# Patient Record
Sex: Female | Born: 1945
Health system: Southern US, Community
[De-identification: ages and names within clinical notes are randomized; demographics above are authoritative.]

## PROBLEM LIST (undated history)

## (undated) DIAGNOSIS — K5790 Diverticulosis of intestine, part unspecified, without perforation or abscess without bleeding: Secondary | ICD-10-CM

## (undated) DIAGNOSIS — M1990 Unspecified osteoarthritis, unspecified site: Secondary | ICD-10-CM

## (undated) DIAGNOSIS — R011 Cardiac murmur, unspecified: Secondary | ICD-10-CM

## (undated) DIAGNOSIS — D649 Anemia, unspecified: Secondary | ICD-10-CM

## (undated) DIAGNOSIS — K5090 Crohn's disease, unspecified, without complications: Secondary | ICD-10-CM

## (undated) DIAGNOSIS — R197 Diarrhea, unspecified: Secondary | ICD-10-CM

## (undated) DIAGNOSIS — Z87442 Personal history of urinary calculi: Secondary | ICD-10-CM

## (undated) DIAGNOSIS — N200 Calculus of kidney: Secondary | ICD-10-CM

## (undated) HISTORY — DX: Diarrhea, unspecified: R19.7

## (undated) HISTORY — PX: BREAST SURGERY: SHX581

## (undated) HISTORY — DX: Crohn's disease, unspecified, without complications: K50.90

---

## 1996-12-17 HISTORY — PX: LAPAROSCOPIC CHOLECYSTECTOMY: SUR755

## 1999-09-01 HISTORY — PX: DILATION AND CURETTAGE OF UTERUS: SHX78

## 2000-02-09 ENCOUNTER — Other Ambulatory Visit: Admission: RE | Admit: 2000-02-09 | Discharge: 2000-02-09 | Payer: Self-pay | Admitting: *Deleted

## 2000-05-23 ENCOUNTER — Encounter: Admission: RE | Admit: 2000-05-23 | Discharge: 2000-05-23 | Payer: Self-pay | Admitting: *Deleted

## 2000-07-17 ENCOUNTER — Encounter (INDEPENDENT_AMBULATORY_CARE_PROVIDER_SITE_OTHER): Payer: Self-pay

## 2000-07-17 ENCOUNTER — Ambulatory Visit (HOSPITAL_COMMUNITY): Admission: RE | Admit: 2000-07-17 | Discharge: 2000-07-17 | Payer: Self-pay | Admitting: *Deleted

## 2001-02-12 ENCOUNTER — Other Ambulatory Visit: Admission: RE | Admit: 2001-02-12 | Discharge: 2001-02-12 | Payer: Self-pay | Admitting: *Deleted

## 2001-03-01 ENCOUNTER — Ambulatory Visit (HOSPITAL_COMMUNITY): Admission: RE | Admit: 2001-03-01 | Discharge: 2001-03-01 | Payer: Self-pay | Admitting: Internal Medicine

## 2001-10-16 ENCOUNTER — Ambulatory Visit (HOSPITAL_COMMUNITY): Admission: RE | Admit: 2001-10-16 | Discharge: 2001-10-16 | Payer: Self-pay | Admitting: Internal Medicine

## 2001-10-16 HISTORY — PX: COLONOSCOPY: SHX174

## 2002-03-04 ENCOUNTER — Other Ambulatory Visit: Admission: RE | Admit: 2002-03-04 | Discharge: 2002-03-04 | Payer: Self-pay | Admitting: *Deleted

## 2003-06-18 ENCOUNTER — Other Ambulatory Visit: Admission: RE | Admit: 2003-06-18 | Discharge: 2003-06-18 | Payer: Self-pay | Admitting: *Deleted

## 2004-07-12 ENCOUNTER — Ambulatory Visit: Payer: Self-pay | Admitting: Internal Medicine

## 2005-03-06 ENCOUNTER — Ambulatory Visit: Payer: Self-pay | Admitting: Internal Medicine

## 2006-04-26 ENCOUNTER — Ambulatory Visit: Payer: Self-pay | Admitting: Internal Medicine

## 2006-10-03 ENCOUNTER — Ambulatory Visit: Payer: Self-pay | Admitting: Internal Medicine

## 2010-05-25 ENCOUNTER — Ambulatory Visit: Payer: Self-pay | Admitting: Internal Medicine

## 2010-07-05 ENCOUNTER — Ambulatory Visit: Payer: Self-pay | Admitting: Internal Medicine

## 2010-07-07 ENCOUNTER — Ambulatory Visit (HOSPITAL_COMMUNITY)
Admission: RE | Admit: 2010-07-07 | Discharge: 2010-07-07 | Payer: Self-pay | Source: Home / Self Care | Admitting: Internal Medicine

## 2010-07-08 ENCOUNTER — Ambulatory Visit (HOSPITAL_COMMUNITY)
Admission: RE | Admit: 2010-07-08 | Discharge: 2010-07-08 | Payer: Self-pay | Source: Home / Self Care | Attending: Internal Medicine | Admitting: Internal Medicine

## 2010-07-19 ENCOUNTER — Ambulatory Visit: Payer: Self-pay | Admitting: Internal Medicine

## 2010-10-18 ENCOUNTER — Ambulatory Visit (INDEPENDENT_AMBULATORY_CARE_PROVIDER_SITE_OTHER): Payer: BC Managed Care – PPO | Admitting: Internal Medicine

## 2010-10-18 DIAGNOSIS — R197 Diarrhea, unspecified: Secondary | ICD-10-CM

## 2010-10-18 DIAGNOSIS — K5000 Crohn's disease of small intestine without complications: Secondary | ICD-10-CM

## 2010-10-18 DIAGNOSIS — K5090 Crohn's disease, unspecified, without complications: Secondary | ICD-10-CM

## 2010-10-18 DIAGNOSIS — K5900 Constipation, unspecified: Secondary | ICD-10-CM | POA: Insufficient documentation

## 2010-10-18 HISTORY — DX: Crohn's disease, unspecified, without complications: K50.90

## 2010-10-18 HISTORY — DX: Diarrhea, unspecified: R19.7

## 2010-12-16 NOTE — Op Note (Signed)
[facility]  Patient:    Gail Henry, Gail Henry                    MRN: [id number] Proc. Date: 07/17/00 Adm. Date:  [id number] Attending:  Adela Lank CC:         Dr. Stanford [place], [place]   Operative Report  PREOPERATIVE DIAGNOSES:       1. Postmenopausal bleeding on hormone                                  replacement therapy.                               2. Abnormal sonogram suggestive of polyp.  POSTOPERATIVE DIAGNOSES:      1. Small right peritubal polyps.                               2. Postmenopausal bleeding on hormone                                  replacement therapy.  OPERATION:                    1. Examination under anesthesia.                               2. Fractional D&C hysteroscopy with polyp                                  resection.  SURGEON:                      Freddie Apley, M.D.  ASSISTANT:  ANESTHESIA:                   General endotracheal anesthesia.  ESTIMATED BLOOD LOSS:  INDICATIONS:                  Gail Henry is a 65 year old female who was seen in the office for evaluation of abnormal bleeding on hormone replacement therapy.  She was bleeding on Prempro then switched to [place]imb[place] and continued to have irregular bleeding.  Following her initial visit, plans were made to perform a transvaginal sonogram after Provera withdrawal.  This was performed in July.  At that time, there was a very tiny hyperechoic mass in the fundus. The patient could not tolerate balloon inflation for hydrosonogram.  We placed her on Premphase for three months with colander and on that medication, s he had constant vaginal discharge which was pink in nature.  Plans were made to perform D&C hysteroscopy and she is brought to the operating room today for this purpose.  OPERATIVE FINDINGS:           Examination under anesthesia shows a firm anteflexed uterus which is approximately eight weeks in size. There is  the suggestion of a posterior fundal myoma.  There are no adnexal masses.  On sonogram in the office she also had several myomas arising from the fundus. Endocervical curettings were scant, mostly mucus in nature.  Endometrial sound passed to  9 cm.  The lower uterine segment was large and the fundus was expanded.  There was a large amount of free endometrium lying in the top of the fundus.  Once this had been cleared out, there were two very small polyps seen near the right tubal ostia. This was either a bilobed polyp or two small polyps which are lying next to each other.  DESCRIPTION OF PROCEDURE:     Gail Henry is brought to the operating room with an IV placed.  She received a gram of Ancef in the holding area.  Supine on the OR table, general endotracheal anesthesia was administered and then she was placed into Allen stirrups.  Examination under anesthesia was performed. She was then prepped with Hibiclens solution, prepping the vagina, cervix, perineum and upper thighs as well as lower abdomen. She was then draped for a vaginal procedure with a collecting drape lying beneath the buttocks to collect the effluent for I&O measurements.  Bivalve speculum was inserted into the vagina.  Marcaine 0.25% was injected into the anterior cervix which was grasped with single-tooth tenaculum.  Endocervical curettings were obtained with a Kevorkian curette.  Uterine sound then passed into the endometrial cavity to a depth of 9 cm.  Serial Pratt dilators were used to dilate the cervix.  When we got to size 31, the dilator would no longer pass and therefore a tenaculum was placed on the posterior cervix.  Using these in tandem, I was able to dilate the cervix to admit the hysteroscope.  The hysteroscope was attached to Sorbitol which was used for through and through irrigation.  The cavity was visualized and photographs were taken.  Once these photographs were taken, the scope was removed and a  small sharp curette was used to curette the uterine fundus. The hysteroscope was reintroduced and two small polyps were found lying above the tubal ostia on the patients right. The right angle wire would not go above the polyps and it was impossible to resect them using this right angle wire. The wire was changed to a straight wire and using this wire, I was able to get behind the polyps using 110/110 blend one cautery, the polyps were resected. They were sent along with the endometrial curettings.  A small sharp curette was reintroduced.  Polyp forceps were introduced to grab any loose tissue. The procedure was then terminated.  The effluent which was missing at the end of the procedure was approximately 200 cc.  There were no intraoperative complications.  The patient was taken to the recovery room in good condition. She will be discharged to home with her husband this afternoon. DD:  07/17/00 TD:  07/18/00 Job: [id number]

## 2011-01-19 LAB — BASIC METABOLIC PANEL
Creatinine: 0.7 mg/dL (ref 0.5–1.1)
Glucose: 83 mg/dL
Potassium: 4.5 mmol/L (ref 3.4–5.3)
Sodium: 138 mmol/L (ref 137–147)

## 2011-01-19 LAB — CBC AND DIFFERENTIAL
HCT: 43 % (ref 36–46)
Hemoglobin: 13.8 g/dL (ref 12.0–16.0)
Platelets: 305 10*3/uL (ref 150–399)

## 2011-01-19 LAB — HEPATIC FUNCTION PANEL: ALT: 17 U/L (ref 7–35)

## 2011-02-20 ENCOUNTER — Ambulatory Visit (INDEPENDENT_AMBULATORY_CARE_PROVIDER_SITE_OTHER): Payer: BC Managed Care – PPO | Admitting: Internal Medicine

## 2011-02-27 ENCOUNTER — Encounter (INDEPENDENT_AMBULATORY_CARE_PROVIDER_SITE_OTHER): Payer: Self-pay

## 2011-04-17 ENCOUNTER — Encounter (INDEPENDENT_AMBULATORY_CARE_PROVIDER_SITE_OTHER): Payer: Self-pay | Admitting: Internal Medicine

## 2011-04-17 ENCOUNTER — Ambulatory Visit (INDEPENDENT_AMBULATORY_CARE_PROVIDER_SITE_OTHER): Payer: BC Managed Care – PPO | Admitting: Internal Medicine

## 2011-04-17 VITALS — BP 118/70 | HR 74 | Temp 98.3°F | Ht 66.0 in | Wt 180.0 lb

## 2011-04-17 DIAGNOSIS — K5090 Crohn's disease, unspecified, without complications: Secondary | ICD-10-CM

## 2011-04-17 NOTE — Patient Instructions (Signed)
Please go to lab for CRP. Continue AcipHex and Pentasa as before. Call if abdominal pain gets worse.

## 2011-04-17 NOTE — Progress Notes (Signed)
Presenting complaint; followup for small bowel Crohn's disease. Subjective; patient states she is back to her baseline. She finished budesonide last month and nothing has changed . She has diarrhea and or constipation. Most days she has 1-3 bowel movements per day. Every time she eats breakfast she feels sick and has 1 to 3 bowel movements these symptoms do not recur with other meals. She has occasional fleeting pain in right lower quadrant of her abdomen. She denies rectal bleeding. She has gained 5 pounds since her last visit. Her heartburn is well controlled with therapy. Current medications; Current Outpatient Prescriptions on File Prior to Visit  Medication Sig Dispense Refill  . mesalamine (PENTASA) 250 MG CR capsule Take 500 mg by mouth. Takes 2 Capsules by Mouth Four Times Daily      . Multiple Vitamins-Calcium (VIACTIV MULTI-VITAMIN) CHEW Chew by mouth. Patient takes 3 daily      . RABEprazole (ACIPHEX) 20 MG tablet Take 20 mg by mouth daily.         objective; BP 118/70  Pulse 74  Temp(Src) 98.3 F (36.8 C) (Oral)  Ht 5' 6"  (1.676 m)  Wt 180 lb (81.647 kg)  BMI 29.05 kg/m2 Patient does not appear to be in any distress. Conjunctiva is pink. Sclerae nonicteric. Oropharyngeal mucosa is normal. Lungs are clear to auscultation. Abdomen is full. Bowel sounds are normal. Soft abdomen on palpation with fullness and mild tenderness in right lower/mid quadrant no organomegaly noted. No clubbing. She has trace edema around right ankle. Lab data; She had normal CBC and comprehensive chemistry panel in June 2012 Assessment; Ileal Crohn's disease. She had satisfactory response to the budesonide. However she still has some fullness and tenderness in right lower/mid quadrant. She could not tolerate 6-MP on account of bump in her transaminases and let cramps. If she has another relapse would consider biologic therapy. Plan; Continue Pentasa at 4 g per day. We'll check her CRP level. Unless she  has problems she will return for office visit in 6 months.

## 2011-04-26 ENCOUNTER — Telehealth (INDEPENDENT_AMBULATORY_CARE_PROVIDER_SITE_OTHER): Payer: Self-pay | Admitting: *Deleted

## 2011-04-26 DIAGNOSIS — K5090 Crohn's disease, unspecified, without complications: Secondary | ICD-10-CM

## 2011-04-26 NOTE — Telephone Encounter (Signed)
Per Dr. Laural Golden the patient will need a repeat CRP. This is noted for 05-26-11. The order will be faxed to [facility] in October.

## 2011-05-04 ENCOUNTER — Encounter (INDEPENDENT_AMBULATORY_CARE_PROVIDER_SITE_OTHER): Payer: Self-pay | Admitting: *Deleted

## 2011-05-26 ENCOUNTER — Other Ambulatory Visit (INDEPENDENT_AMBULATORY_CARE_PROVIDER_SITE_OTHER): Payer: Self-pay | Admitting: Internal Medicine

## 2011-06-11 ENCOUNTER — Other Ambulatory Visit (INDEPENDENT_AMBULATORY_CARE_PROVIDER_SITE_OTHER): Payer: Self-pay | Admitting: Internal Medicine

## 2011-06-11 DIAGNOSIS — K219 Gastro-esophageal reflux disease without esophagitis: Secondary | ICD-10-CM

## 2011-08-02 ENCOUNTER — Encounter (INDEPENDENT_AMBULATORY_CARE_PROVIDER_SITE_OTHER): Payer: Self-pay | Admitting: *Deleted

## 2011-08-22 ENCOUNTER — Ambulatory Visit (INDEPENDENT_AMBULATORY_CARE_PROVIDER_SITE_OTHER): Payer: BC Managed Care – PPO | Admitting: Internal Medicine

## 2011-08-22 ENCOUNTER — Encounter (INDEPENDENT_AMBULATORY_CARE_PROVIDER_SITE_OTHER): Payer: Self-pay | Admitting: Internal Medicine

## 2011-08-22 VITALS — BP 110/80 | HR 76 | Temp 97.8°F | Resp 16 | Ht 66.0 in | Wt 176.3 lb

## 2011-08-22 DIAGNOSIS — K219 Gastro-esophageal reflux disease without esophagitis: Secondary | ICD-10-CM

## 2011-08-22 DIAGNOSIS — K5000 Crohn's disease of small intestine without complications: Secondary | ICD-10-CM

## 2011-08-22 DIAGNOSIS — S2231XA Fracture of one rib, right side, initial encounter for closed fracture: Secondary | ICD-10-CM | POA: Insufficient documentation

## 2011-08-22 MED ORDER — PREDNISONE 5 MG PO TABS: 10.0000 mg | ORAL_TABLET | Freq: Every day | ORAL | Status: AC

## 2011-08-22 NOTE — Progress Notes (Signed)
Presenting complaint; Right-sided abdominal pain. Patient with known small bowel Crohn's disease Subjective: Shotted a 66 year old Caucasian female who is here for scheduled visit. He was last seen in September 2012 and had mildly elevated CRP and a month later it was down to 0.51. At that time she was treated with budesonide but could not tell symptomatic improvement. She now presents with right lower quadrant pain and diarrhea. She is having at least 2-3 stools per day. Previously she would have diarrhea and/or constipation. She denies melena or rectal bleeding. Last month she developed a sinusitis and was treated with Cipro and Hydromet and now she's developed fracture to one of her ribs on the right site secondary to coughing spells. She denies shortness of breath fever. She says her heartburn is well controlled with AcipHex Current Medications: Current Outpatient Prescriptions  Medication Sig Dispense Refill  . ACIPHEX 20 MG tablet TAKE 1 TABLET EACH MORNING HAS DISCOUNT CARD ON FILE  30 tablet  11  . Carboxymethylcellulose Sodium (LUBRICANT EYE DROPS OP) Apply to eye. 2 drops in each eye twice daily       . Cholecalciferol (VITAMIN D3) 2000 UNITS TABS Take 2,000 Units by mouth daily.        . fluticasone (FLONASE) 50 MCG/ACT nasal spray Place 1 spray into the nose daily. As needed      . folic acid (FOLVITE) 093 MCG tablet Take 400 mcg by mouth daily.        . mesalamine (PENTASA) 250 MG CR capsule Take 500 mg by mouth. Takes 2 Capsules by Mouth Four Times Daily      . Multiple Vitamins-Calcium (VIACTIV MULTI-VITAMIN) CHEW Chew by mouth. Patient takes 3 daily      . Multiple Vitamins-Minerals (MULTIVITAMIN & MINERAL PO) Take by mouth 2 (two) times daily.        . predniSONE (DELTASONE) 5 MG tablet Take 2 tablets (10 mg total) by mouth daily.  50 tablet  0    Objective: Blood pressure 110/80, pulse 76, temperature 97.8 F (36.6 C), temperature source Oral, resp. rate 16, height 5' 6"   (1.676 m), weight 176 lb 4.8 oz (79.969 kg). Conjunctiva is pink. Sclera is nonicteric Oral pharyngeal mucosa is normal. No neck masses or thyromegaly noted. Cardiac exam with regular rhythm normal S1 and S2. No murmur or gallop noted. Lungs are clear to auscultation. Abdomen abdomen is full. Bowel sounds are normal. Soft abdomen with fullness and mild to moderate tenderness in right lower and mid quadrant. No hepatosplenomegaly or masses noted.  No LE edema or clubbing noted.  Labs/studies Results: CRP from 08/14/2011 was 0.47(Dr. Tapper's office).  Assessment: #1. Patient's exam suggests she may have relapse of small bowel Crohn's disease even though her CRP is normal. She is currently on Pentasa which is weak medicine for small bowel disease. She developed problems with 6 MP. If she develops progressive symptoms next step would be biologic therapy. #2. Chronic GERD. Symptoms well controlled with PPI anti-reflex measures.   Plan: Continue Pentasa at current dose.  Prednisone 30 mg daily for 7 days. Prednisone 20 mg daily for 7 days. Prednisone 10 mg daily for 7 days. Prednisone 5 mg daily for 7 days and stop. Call office with progress report at 4 weeks. Unless has problems we'll plan to see her back in 6 months. Will schedule her for screening colonoscopy following her next was. Her last colonoscopy was in June 2003.

## 2011-08-22 NOTE — Patient Instructions (Signed)
Prednisone schedule as follows. 30 mg daily for 7 days. 20 mg daily for 7 days. 10 mg daily for 7 days. 5 mg daily for 7 days and stop. Call with progress report in 4 weeks

## 2012-02-19 ENCOUNTER — Ambulatory Visit (INDEPENDENT_AMBULATORY_CARE_PROVIDER_SITE_OTHER): Payer: BC Managed Care – PPO | Admitting: Internal Medicine

## 2012-02-19 ENCOUNTER — Encounter (INDEPENDENT_AMBULATORY_CARE_PROVIDER_SITE_OTHER): Payer: Self-pay | Admitting: Internal Medicine

## 2012-02-19 ENCOUNTER — Other Ambulatory Visit (INDEPENDENT_AMBULATORY_CARE_PROVIDER_SITE_OTHER): Payer: Self-pay | Admitting: *Deleted

## 2012-02-19 ENCOUNTER — Telehealth (INDEPENDENT_AMBULATORY_CARE_PROVIDER_SITE_OTHER): Payer: Self-pay | Admitting: *Deleted

## 2012-02-19 VITALS — BP 112/74 | HR 70 | Temp 97.9°F | Resp 18 | Ht 66.0 in | Wt 180.2 lb

## 2012-02-19 DIAGNOSIS — K5090 Crohn's disease, unspecified, without complications: Secondary | ICD-10-CM

## 2012-02-19 DIAGNOSIS — Z1211 Encounter for screening for malignant neoplasm of colon: Secondary | ICD-10-CM

## 2012-02-19 DIAGNOSIS — Z139 Encounter for screening, unspecified: Secondary | ICD-10-CM

## 2012-02-19 MED ORDER — SOD PHOS MONO-SOD PHOS DIBASIC 1.102-0.398 G PO TABS: 1.0000 | ORAL_TABLET | Freq: Once | ORAL | Status: DC

## 2012-02-19 NOTE — Telephone Encounter (Signed)
agree

## 2012-02-19 NOTE — Patient Instructions (Addendum)
Colonoscopy to be scheduled. Physician will contact you with results of blood work.

## 2012-02-19 NOTE — Progress Notes (Signed)
Presenting complaint;  Follow for small bowel Crohn's disease.  Subjective:  Patient is 66 -year-old Caucasian female who is here for scheduled visit. She was last seen in January this year for flareups treated with short course of prednisone. She has not had any problems since then. Her only complaint is one of bloating without abdominal pain or diarrhea. Since she has been yogurt with probiotic her bowels been moving regularly usually twice daily. She denies melena or rectal bleeding. Only time she experiences heartburn is she forgets to take her medication. She has gained 4 pounds since her last visit. She does walk 1.75 miles at least 5 times a week. She had bone density study last month in [place] and it was normal.  Current Medications: Current Outpatient Prescriptions  Medication Sig Dispense Refill  . ACIPHEX 20 MG tablet TAKE 1 TABLET EACH MORNING HAS DISCOUNT CARD ON FILE  30 tablet  11  . Carboxymethylcellulose Sodium (LUBRICANT EYE DROPS OP) Apply to eye. 2 drops in each eye twice daily       . Cholecalciferol (VITAMIN D3) 2000 UNITS TABS Take 2,000 Units by mouth daily.        . fluticasone (FLONASE) 50 MCG/ACT nasal spray Place 1 spray into the nose daily. As needed      . folic acid (FOLVITE) 428 MCG tablet Take 400 mcg by mouth daily.        . mesalamine (PENTASA) 250 MG CR capsule Take 500 mg by mouth. Takes 2 Capsules by Mouth Four Times Daily      . Multiple Vitamins-Calcium (VIACTIV MULTI-VITAMIN) CHEW Chew by mouth. Patient takes 3 daily      . Multiple Vitamins-Minerals (MULTIVITAMIN & MINERAL PO) Take by mouth 2 (two) times daily.        Marland Kitchen spironolactone (ALDACTONE) 25 MG tablet Take 25 mg by mouth daily.       Marland Kitchen ZOSTAVAX 76811 UNT/0.65ML injection Inject into the skin once.          Objective: Blood pressure 112/74, pulse 70, temperature 97.9 F (36.6 C), temperature source Oral, resp. rate 18, height 5' 6"  (1.676 m), weight 180 lb 3.2 oz (81.738 kg). Patient is  alert and in no acute distress. Conjunctiva is pink. Sclera is nonicteric Oropharyngeal mucosa is normal. No neck masses or thyromegaly noted. Cardiac exam with regular rhythm normal S1 and S2. No murmur or gallop noted. Lungs are clear to auscultation. Abdomen is full. Bowel sounds are normal. Soft abdomen on palpation with fullness in the right lower quadrant but no organomegaly or masses.  No LE edema or clubbing noted.   Assessment:  #1. Small bowel Crohn's disease. She appears to be in remission. Currently on oral mesalamine i.e. Pentasa. #2. Chronic GERD. Symptoms well controlled with therapy.   Plan:  She will go to the lab for CBC, comprehensive chemistry panel and a TSH level. Continue current therapy. Screening colonoscopy  sometime this year(last exam in 2003). Office visit in 6 months.

## 2012-02-19 NOTE — Telephone Encounter (Signed)
Patient needs osmo pill prep, she is aware that prep isn't on her formulary -- if cost is too high she will let me know and we will change to a different prep

## 2012-02-22 LAB — COMPREHENSIVE METABOLIC PANEL
Albumin: 3.9 g/dL (ref 3.5–5.2)
Alkaline Phosphatase: 96 U/L (ref 39–117)
BUN: 15 mg/dL (ref 6–23)
Calcium: 9.5 mg/dL (ref 8.4–10.5)
Chloride: 103 mEq/L (ref 96–112)
Glucose, Bld: 87 mg/dL (ref 70–99)
Potassium: 4.3 mEq/L (ref 3.5–5.3)
Sodium: 140 mEq/L (ref 135–145)
Total Protein: 6.9 g/dL (ref 6.0–8.3)

## 2012-02-22 LAB — CBC
HCT: 39.6 % (ref 36.0–46.0)
Hemoglobin: 13.6 g/dL (ref 12.0–15.0)
MCHC: 34.3 g/dL (ref 30.0–36.0)
RBC: 4.7 MIL/uL (ref 3.87–5.11)
WBC: 5.9 10*3/uL (ref 4.0–10.5)

## 2012-03-05 ENCOUNTER — Encounter (HOSPITAL_COMMUNITY): Payer: Self-pay | Admitting: Pharmacy Technician

## 2012-03-15 ENCOUNTER — Ambulatory Visit (HOSPITAL_COMMUNITY)
Admission: RE | Admit: 2012-03-15 | Discharge: 2012-03-15 | Disposition: A | Discharge status: Home / Self Care | Payer: BC Managed Care – PPO | Source: Ambulatory Visit | Attending: Internal Medicine | Admitting: Internal Medicine

## 2012-03-15 ENCOUNTER — Encounter (HOSPITAL_COMMUNITY)
Admission: RE | Disposition: A | Discharge status: Home / Self Care | Payer: Self-pay | Source: Ambulatory Visit | Attending: Internal Medicine

## 2012-03-15 ENCOUNTER — Encounter (HOSPITAL_COMMUNITY): Payer: Self-pay | Admitting: *Deleted

## 2012-03-15 DIAGNOSIS — K5289 Other specified noninfective gastroenteritis and colitis: Secondary | ICD-10-CM | POA: Insufficient documentation

## 2012-03-15 DIAGNOSIS — Z1211 Encounter for screening for malignant neoplasm of colon: Secondary | ICD-10-CM | POA: Insufficient documentation

## 2012-03-15 DIAGNOSIS — D126 Benign neoplasm of colon, unspecified: Secondary | ICD-10-CM | POA: Insufficient documentation

## 2012-03-15 DIAGNOSIS — K5730 Diverticulosis of large intestine without perforation or abscess without bleeding: Secondary | ICD-10-CM | POA: Insufficient documentation

## 2012-03-15 DIAGNOSIS — K644 Residual hemorrhoidal skin tags: Secondary | ICD-10-CM

## 2012-03-15 DIAGNOSIS — K6389 Other specified diseases of intestine: Secondary | ICD-10-CM

## 2012-03-15 HISTORY — PX: COLONOSCOPY: SHX5424

## 2012-03-15 SURGERY — COLONOSCOPY
Anesthesia: Moderate Sedation

## 2012-03-15 MED ORDER — MEPERIDINE HCL 50 MG/ML IJ SOLN
INTRAMUSCULAR | Status: AC
  Filled 2012-03-15: qty 1

## 2012-03-15 MED ORDER — STERILE WATER FOR IRRIGATION IR SOLN
Status: DC | PRN
  Administered 2012-03-15: 08:00:00

## 2012-03-15 MED ORDER — MEPERIDINE HCL 50 MG/ML IJ SOLN
INTRAMUSCULAR | Status: DC | PRN
  Administered 2012-03-15 (×2): 25 mg via INTRAVENOUS

## 2012-03-15 MED ORDER — MIDAZOLAM HCL 5 MG/5ML IJ SOLN
INTRAMUSCULAR | Status: AC
  Filled 2012-03-15: qty 10

## 2012-03-15 MED ORDER — MIDAZOLAM HCL 5 MG/5ML IJ SOLN
INTRAMUSCULAR | Status: DC | PRN
  Administered 2012-03-15 (×4): 2 mg via INTRAVENOUS

## 2012-03-15 MED ORDER — SODIUM CHLORIDE 0.45 % IV SOLN
Freq: Once | INTRAVENOUS | Status: AC
  Administered 2012-03-15: 1000 mL via INTRAVENOUS

## 2012-03-15 NOTE — H&P (Signed)
Gail Henry is an 66 y.o. female.   Chief Complaint: Patient is here for colonoscopy. HPI: Patient is 67 year old Caucasian female who is here for average risk screening colonoscopy. Last exam was 10 years ago. She denies abdominal pain rectal bleeding. She has small bowel Crohn's disease with a 2 surgeries in the past. She has had intermittent constipation. She has good appetite and her weight has been stable. Family history is negative for colorectal carcinoma.  Past Medical History  Diagnosis Date  . Crohn's disease 10/18/2010    SMALL BOWELS  . Abdominal pain 10/18/2010  . Diarrhea 10/18/2010  . Constipation 10/18/2010    Past Surgical History  Procedure Date  . Laparoscopic cholecystectomy 12/17/96    DEMASON  . Colonoscopy 10/16/2001    Family History  Problem Relation Age of Onset  . Heart disease Father   . Healthy Sister   . Healthy Brother   . Healthy Sister   . Healthy Daughter   . Healthy Son    Social History:  reports that she has never smoked. She has never used smokeless tobacco. She reports that she does not drink alcohol or use illicit drugs.  Allergies:  Allergies  Allergen Reactions  . Penicillins Hives    Medications Prior to Admission  Medication Sig Dispense Refill  . ACIPHEX 20 MG tablet TAKE 1 TABLET EACH MORNING HAS DISCOUNT CARD ON FILE  30 tablet  11  . Carboxymethylcellulose Sodium (LUBRICANT EYE DROPS OP) Apply 1 drop to eye as needed. For dry eyes      . Cholecalciferol (VITAMIN D3) 2000 UNITS TABS Take 2,000 Units by mouth daily.       . fluticasone (FLONASE) 50 MCG/ACT nasal spray Place 1 spray into the nose daily as needed. For congestion      . folic acid (FOLVITE) 977 MCG tablet Take 400 mcg by mouth daily.        . mesalamine (PENTASA) 500 MG CR capsule Take 500 mg by mouth 4 (four) times daily.      . Multiple Vitamins-Calcium (VIACTIV MULTI-VITAMIN) CHEW Chew 3 tablets by mouth daily.       . Multiple Vitamins-Minerals  (MULTIVITAMIN & MINERAL PO) Take 1 tablet by mouth 2 (two) times daily.       Marland Kitchen spironolactone (ALDACTONE) 25 MG tablet Take 25 mg by mouth daily.       . sodium phosphates (OSMOPREP) 1.102-0.398 G TABS Take 1 tablet by mouth once.  32 tablet  0    No results found for this or any previous visit (from the past 48 hour(s)). No results found.  ROS  Blood pressure 149/88, pulse 84, temperature 98.5 F (36.9 C), temperature source Oral, resp. rate 20, height 5' 6.5" (1.689 m), weight 173 lb (78.472 kg), SpO2 99.00%. Physical Exam  Constitutional: She appears well-developed and well-nourished.  HENT:  Mouth/Throat: Oropharynx is clear and moist.  Eyes: Conjunctivae are normal. No scleral icterus.  Neck: No thyromegaly present.  Cardiovascular: Normal rate, regular rhythm and normal heart sounds.   No murmur heard. Respiratory: Effort normal and breath sounds normal.  GI: Soft. She exhibits no distension and no mass. There is no tenderness.  Musculoskeletal: She exhibits no edema.  Lymphadenopathy:    She has no cervical adenopathy.  Neurological: She is alert.  Skin: Skin is warm.     Assessment/Plan Average risk screening colonoscopy.  Henry,Gail U 03/15/2012, 7:36 AM

## 2012-03-15 NOTE — Op Note (Signed)
COLONOSCOPY PROCEDURE REPORT  PATIENT:  Gail Henry  MR#:  [id number] Birthdate:  May 13, 1946, 66 y.o., female Endoscopist:  Dr. Rogene Houston, MD Referred By:  Dr. Zella Richer. Scotty Court, MD Procedure Date: 03/15/2012  Procedure:   Colonoscopy  Indications:  Patient is 66 year old Caucasian female was undergoing average risk screening colonoscopy. She has small bowel Crohn disease and has undergone 2 surgeries in the past most recently in 1995.  Informed Consent:  The procedure and risks were reviewed with the patient and informed consent was obtained.  Medications:  Demerol 50 mg IV Versed 8 mg IV  Description of procedure:  After a digital rectal exam was performed, that colonoscope was advanced from the anus through the rectum and colon to the area of the cecum, ileocecal valve and appendiceal orifice. The cecum was deeply intubated. These structures were well-seen and photographed for the record. From the level of the cecum and ileocecal valve, the scope was slowly and cautiously withdrawn. The mucosal surfaces were carefully surveyed utilizing scope tip to flexion to facilitate fold flattening as needed. The scope was pulled down into the rectum where a thorough exam including retroflexion was performed.  Findings:   Prep excellent. Small polyp ablated via cold biopsy from cecum. Abnormal mucosa with nodularity ulceration and friability at ileocecal valve with luminal narrowing. Bile noted to be entering large bowel from this area. Biopsy taken from this area. Did not attempt to pass the scope across it because of luminal narrowing. Scattered diverticula at sigmoid colon. Local nodularity to mucosa at sigmoid colon ulceration. This area was also biopsied for histology. Normal rectal mucosa. Hemorrhoids below the dentate line and 3 anal papillae.  Therapeutic/Diagnostic Maneuvers Performed:  See above  Complications:  None  Cecal Withdrawal Time:  20 minutes  Impression:    Mucosal nodularity with ulceration at ileocecal valve along with luminal narrowing. Biopsy taken from this area. TI not examined. Small cecal polyp ablated via cold biopsy. Focal ulcerated nodular mucosa at sigmoid colon ostomy hyperplastic polyp. Biopsy taken. Scattered sigmoid colon diverticula. External hemorrhoids and 3 anal papillae.  Recommendations:  Standard instructions given. I will be contacting patient with biopsy results and further recommendations.   Riyad Keena U  03/15/2012 8:28 AM  CC: Dr. Deloria Lair, MD & Dr. Rayne Du ref. provider found

## 2012-03-19 ENCOUNTER — Encounter (HOSPITAL_COMMUNITY): Payer: Self-pay | Admitting: Internal Medicine

## 2012-03-20 ENCOUNTER — Encounter (INDEPENDENT_AMBULATORY_CARE_PROVIDER_SITE_OTHER): Payer: Self-pay | Admitting: Internal Medicine

## 2012-03-21 NOTE — Telephone Encounter (Signed)
This encounter was created in error - please disregard.

## 2012-03-24 ENCOUNTER — Other Ambulatory Visit (INDEPENDENT_AMBULATORY_CARE_PROVIDER_SITE_OTHER): Payer: Self-pay | Admitting: Internal Medicine

## 2012-03-25 ENCOUNTER — Telehealth (INDEPENDENT_AMBULATORY_CARE_PROVIDER_SITE_OTHER): Payer: Self-pay | Admitting: *Deleted

## 2012-03-25 DIAGNOSIS — K5010 Crohn's disease of large intestine without complications: Secondary | ICD-10-CM

## 2012-03-25 NOTE — Telephone Encounter (Signed)
Patient will need to have CBC/D in 1 month.Noted for September

## 2012-03-28 ENCOUNTER — Encounter (INDEPENDENT_AMBULATORY_CARE_PROVIDER_SITE_OTHER): Payer: Self-pay | Admitting: *Deleted

## 2012-03-28 ENCOUNTER — Other Ambulatory Visit (INDEPENDENT_AMBULATORY_CARE_PROVIDER_SITE_OTHER): Payer: Self-pay | Admitting: *Deleted

## 2012-03-28 DIAGNOSIS — K5010 Crohn's disease of large intestine without complications: Secondary | ICD-10-CM

## 2012-04-03 ENCOUNTER — Telehealth (INDEPENDENT_AMBULATORY_CARE_PROVIDER_SITE_OTHER): Payer: Self-pay | Admitting: Internal Medicine

## 2012-04-03 MED ORDER — MESALAMINE ER 500 MG PO CPCR: 500.0000 mg | ORAL_CAPSULE | Freq: Four times a day (QID) | ORAL | Status: DC

## 2012-04-03 MED ORDER — MESALAMINE ER 500 MG PO CPCR: 1000.0000 mg | ORAL_CAPSULE | Freq: Four times a day (QID) | ORAL | Status: DC

## 2012-04-03 NOTE — Telephone Encounter (Signed)
Pentasa eprescribed to [facility]

## 2012-04-25 LAB — C-REACTIVE PROTEIN: CRP: 0.6 mg/dL — ABNORMAL HIGH (ref <0.60)

## 2012-05-01 ENCOUNTER — Telehealth (INDEPENDENT_AMBULATORY_CARE_PROVIDER_SITE_OTHER): Payer: Self-pay | Admitting: *Deleted

## 2012-05-01 DIAGNOSIS — K5000 Crohn's disease of small intestine without complications: Secondary | ICD-10-CM

## 2012-05-01 NOTE — Telephone Encounter (Signed)
Per Dr.Rehman the patient will need to have repeat CRP in 3 months. This has been noted for January 2,2014

## 2012-05-16 ENCOUNTER — Telehealth (INDEPENDENT_AMBULATORY_CARE_PROVIDER_SITE_OTHER): Payer: Self-pay | Admitting: *Deleted

## 2012-05-16 NOTE — Telephone Encounter (Signed)
Patient states that about 1 week ago she started having pain on her left side. She knows that her Crohn 's  is on the right. The pain is directly across from that side to the left.  She has noted a change in her bowel movements , they are now formed. She does not go first thing in the morning,it is various times. This happens before and after meals. She has had some nausea. Per Dr.Rehman may call in Entocort  EC she is to take 9 mg by mouth for 2 weeks, then 6 mg for 2 weeks , then 3 mg for 2 weeks then stop. Patient  made aware, Prescription called to [facility].

## 2012-06-17 ENCOUNTER — Encounter (INDEPENDENT_AMBULATORY_CARE_PROVIDER_SITE_OTHER): Payer: Self-pay

## 2012-06-21 ENCOUNTER — Other Ambulatory Visit (INDEPENDENT_AMBULATORY_CARE_PROVIDER_SITE_OTHER): Payer: Self-pay | Admitting: Internal Medicine

## 2012-07-11 ENCOUNTER — Telehealth (INDEPENDENT_AMBULATORY_CARE_PROVIDER_SITE_OTHER): Payer: Self-pay | Admitting: *Deleted

## 2012-07-11 ENCOUNTER — Encounter (INDEPENDENT_AMBULATORY_CARE_PROVIDER_SITE_OTHER): Payer: Self-pay | Admitting: *Deleted

## 2012-07-11 DIAGNOSIS — K5000 Crohn's disease of small intestine without complications: Secondary | ICD-10-CM

## 2012-07-11 NOTE — Telephone Encounter (Signed)
Lab order done

## 2012-08-01 ENCOUNTER — Other Ambulatory Visit (INDEPENDENT_AMBULATORY_CARE_PROVIDER_SITE_OTHER): Payer: Self-pay | Admitting: Internal Medicine

## 2012-08-02 LAB — C-REACTIVE PROTEIN: CRP: 0.9 mg/dL — ABNORMAL HIGH (ref <0.60)

## 2012-08-19 ENCOUNTER — Encounter (INDEPENDENT_AMBULATORY_CARE_PROVIDER_SITE_OTHER): Payer: Self-pay | Admitting: Internal Medicine

## 2012-08-19 ENCOUNTER — Ambulatory Visit (INDEPENDENT_AMBULATORY_CARE_PROVIDER_SITE_OTHER): Payer: BC Managed Care – PPO | Admitting: Internal Medicine

## 2012-08-19 VITALS — BP 110/74 | HR 72 | Temp 97.8°F | Resp 18 | Ht 66.5 in | Wt 181.9 lb

## 2012-08-19 DIAGNOSIS — K649 Unspecified hemorrhoids: Secondary | ICD-10-CM

## 2012-08-19 DIAGNOSIS — K5090 Crohn's disease, unspecified, without complications: Secondary | ICD-10-CM

## 2012-08-19 MED ORDER — NYSTATIN-TRIAMCINOLONE 100000-0.1 UNIT/GM-% EX OINT: TOPICAL_OINTMENT | Freq: Two times a day (BID) | CUTANEOUS | Status: DC

## 2012-08-19 NOTE — Progress Notes (Signed)
Presenting complaint;  Followup for Crohn's disease.  Subjective:  Patient is 67 year old Caucasian female with history of ileocolonic Crohn's disease who is here for scheduled visit. She had a colonoscopy back in August 2013 primarily for screening purposes and was noted to have active disease in neoterminal ileum; ileocecal region as well as sigmoid colon and she had 2 small adenomas removed. She states her diarrhea is back. She is having anywhere from 4-11 stools per day. Most of her stools are loose and watery. Every now and then she has formed stool. She continues to complain of sharp pain at right mid abdomen but it lasts only a few minutes at a time and occurs at least 3 times a week but not daily. She also complains of intermittent nausea. She denies melena, rectal bleeding vomiting or fever. She is having itching and discomfort due to inflamed hemorrhoids. She has not lost any weight. She also complains of severe pain involving arches of both feet and she is scheduled to see her podiatrist. Patient states her heartburn is well controlled with PPI.  Current Medications: Current Outpatient Prescriptions  Medication Sig Dispense Refill  . ACIPHEX 20 MG tablet TAKE 1 TABLET EACH BY MOUTH MORNING HAS DISCOUNT CARD ON FILE  30 each  11  . Carboxymethylcellulose Sodium (LUBRICANT EYE DROPS OP) Apply 1 drop to eye as needed. For dry eyes      . Cholecalciferol (VITAMIN D3) 2000 UNITS TABS Take 2,000 Units by mouth daily.       . fluticasone (FLONASE) 50 MCG/ACT nasal spray Place 1 spray into the nose daily as needed. For congestion      . folic acid (FOLVITE) 073 MCG tablet Take 400 mcg by mouth daily.        . mesalamine (PENTASA) 500 MG CR capsule Take 2 capsules (1,000 mg total) by mouth 4 (four) times daily.  720 capsule  4  . Multiple Vitamins-Calcium (VIACTIV MULTI-VITAMIN) CHEW Chew 3 tablets by mouth daily.       . Multiple Vitamins-Minerals (MULTIVITAMIN & MINERAL PO) Take 1 tablet by  mouth 2 (two) times daily.       Marland Kitchen spironolactone (ALDACTONE) 25 MG tablet Take 25 mg by mouth daily.            Objective: Blood pressure 110/74, pulse 72, temperature 97.8 F (36.6 C), temperature source Oral, resp. rate 18, height 5' 6.5" (1.689 m), weight 181 lb 14.4 oz (82.509 kg). Patient is alert and in no acute distress. Conjunctiva is pink. Sclera is nonicteric Oropharyngeal mucosa is normal. No neck masses or thyromegaly noted. Cardiac exam with regular rhythm normal S1 and S2. No murmur or gallop noted. Lungs are clear to auscultation. Abdomen is full. Bowel sounds are normal. Abdomen is soft with fullness and tenderness and right mid abdomen at and above level of scar. No organomegaly or masses  No LE edema or clubbing noted.  Labs/studies Results: CRP from 08/01/2012 0.9(normal less than 0.6).  Assessment:  #1. Ileocolonic Crohn disease. She is having daily diarrhea and frequent right-sided abdominal pain. CRP from earlier this month is mildly elevated. Symptoms are suggestive of relapse of her disease. She had stricture involving the neoterminal ileum on colonoscopy of August 2013 which also revealed disease and sigmoid colon. Since she is intolerant of 6 MP and therefore may need biologic therapy for control of disease. #2. Perianal discomfort secondary to hemorrhoids.    Plan: Mycolog-II ointment to be applied to anal canal twice a day for  2 weeks and then on as-needed basis. Will schedule small bowel follow-through. Patient advised not to take any NSAIDs. I will contact patient with results of small bowel study and further recommendations. Office visit tentatively scheduled for 6 months.

## 2012-08-19 NOTE — Patient Instructions (Signed)
Physician will contact you with results of small bowel study when completed

## 2012-08-23 ENCOUNTER — Ambulatory Visit (HOSPITAL_COMMUNITY)
Admission: RE | Admit: 2012-08-23 | Discharge: 2012-08-23 | Disposition: A | Discharge status: Home / Self Care | Payer: BC Managed Care – PPO | Source: Ambulatory Visit | Attending: Internal Medicine | Admitting: Internal Medicine

## 2012-08-23 DIAGNOSIS — R933 Abnormal findings on diagnostic imaging of other parts of digestive tract: Secondary | ICD-10-CM | POA: Insufficient documentation

## 2012-08-23 DIAGNOSIS — R197 Diarrhea, unspecified: Secondary | ICD-10-CM | POA: Insufficient documentation

## 2012-08-23 DIAGNOSIS — R109 Unspecified abdominal pain: Secondary | ICD-10-CM | POA: Insufficient documentation

## 2012-08-23 DIAGNOSIS — K5090 Crohn's disease, unspecified, without complications: Secondary | ICD-10-CM

## 2012-08-23 DIAGNOSIS — R110 Nausea: Secondary | ICD-10-CM | POA: Insufficient documentation

## 2012-08-29 ENCOUNTER — Other Ambulatory Visit (INDEPENDENT_AMBULATORY_CARE_PROVIDER_SITE_OTHER): Payer: Self-pay | Admitting: Internal Medicine

## 2012-08-29 DIAGNOSIS — R933 Abnormal findings on diagnostic imaging of other parts of digestive tract: Secondary | ICD-10-CM

## 2012-08-29 DIAGNOSIS — K5090 Crohn's disease, unspecified, without complications: Secondary | ICD-10-CM

## 2012-08-30 ENCOUNTER — Other Ambulatory Visit (HOSPITAL_COMMUNITY): Payer: BC Managed Care – PPO

## 2012-09-03 ENCOUNTER — Ambulatory Visit (HOSPITAL_COMMUNITY)
Admission: RE | Admit: 2012-09-03 | Discharge: 2012-09-03 | Disposition: A | Discharge status: Home / Self Care | Payer: BC Managed Care – PPO | Source: Ambulatory Visit | Attending: Internal Medicine | Admitting: Internal Medicine

## 2012-09-03 DIAGNOSIS — K5090 Crohn's disease, unspecified, without complications: Secondary | ICD-10-CM

## 2012-09-03 DIAGNOSIS — R933 Abnormal findings on diagnostic imaging of other parts of digestive tract: Secondary | ICD-10-CM | POA: Insufficient documentation

## 2012-09-03 MED ORDER — IOHEXOL 300 MG/ML  SOLN
125.0000 mL | Freq: Once | INTRAMUSCULAR | Status: AC | PRN
  Administered 2012-09-03: 125 mL via INTRAVENOUS

## 2012-09-05 ENCOUNTER — Telehealth (INDEPENDENT_AMBULATORY_CARE_PROVIDER_SITE_OTHER): Payer: Self-pay | Admitting: *Deleted

## 2012-09-05 DIAGNOSIS — K5090 Crohn's disease, unspecified, without complications: Secondary | ICD-10-CM

## 2012-09-05 NOTE — Telephone Encounter (Signed)
Patient will need to have CRP in 2 months

## 2012-09-14 ENCOUNTER — Other Ambulatory Visit: Payer: Self-pay

## 2012-10-24 ENCOUNTER — Encounter (INDEPENDENT_AMBULATORY_CARE_PROVIDER_SITE_OTHER): Payer: Self-pay | Admitting: *Deleted

## 2012-10-24 ENCOUNTER — Other Ambulatory Visit (INDEPENDENT_AMBULATORY_CARE_PROVIDER_SITE_OTHER): Payer: Self-pay | Admitting: *Deleted

## 2012-10-24 DIAGNOSIS — K5090 Crohn's disease, unspecified, without complications: Secondary | ICD-10-CM

## 2012-11-01 LAB — C-REACTIVE PROTEIN: CRP: 0.8 mg/dL — ABNORMAL HIGH (ref <0.60)

## 2012-11-08 NOTE — Progress Notes (Signed)
Patient called with a progress report. She is having 2-3 bowel movements a day. Some are formed.States that she has had bouts of constipation. Still having problems with her foot, she has had boils on her inner thighs. They are red,hard. One can to a head , some drainage, and went away. The other one is still there but it is shrinking. She questioned if the boils could be part of her Crohn's disease.

## 2013-02-17 ENCOUNTER — Ambulatory Visit (INDEPENDENT_AMBULATORY_CARE_PROVIDER_SITE_OTHER): Payer: BC Managed Care – PPO | Admitting: Internal Medicine

## 2013-02-17 ENCOUNTER — Other Ambulatory Visit (INDEPENDENT_AMBULATORY_CARE_PROVIDER_SITE_OTHER): Payer: Self-pay | Admitting: Internal Medicine

## 2013-02-17 ENCOUNTER — Encounter (INDEPENDENT_AMBULATORY_CARE_PROVIDER_SITE_OTHER): Payer: Self-pay | Admitting: Internal Medicine

## 2013-02-17 VITALS — BP 114/72 | HR 76 | Temp 98.4°F | Resp 18 | Ht 66.5 in | Wt 184.6 lb

## 2013-02-17 DIAGNOSIS — K5090 Crohn's disease, unspecified, without complications: Secondary | ICD-10-CM

## 2013-02-17 DIAGNOSIS — R1013 Epigastric pain: Secondary | ICD-10-CM

## 2013-02-17 DIAGNOSIS — R110 Nausea: Secondary | ICD-10-CM

## 2013-02-17 LAB — C-REACTIVE PROTEIN: CRP: 0.5 mg/dL (ref <0.60)

## 2013-02-17 LAB — HEPATIC FUNCTION PANEL
Bilirubin, Direct: 0.1 mg/dL (ref 0.0–0.3)
Indirect Bilirubin: 0.3 mg/dL (ref 0.0–0.9)

## 2013-02-17 LAB — LIPASE: Lipase: 77 U/L — ABNORMAL HIGH (ref 0–75)

## 2013-02-17 MED ORDER — PANTOPRAZOLE SODIUM 40 MG PO TBEC: 40.0000 mg | DELAYED_RELEASE_TABLET | Freq: Two times a day (BID) | ORAL | Status: DC

## 2013-02-17 NOTE — Progress Notes (Signed)
Presenting complaint;  Epigastric pain and nausea. History of Crohn's disease.  Subjective:  Patient is 67 year old Caucasian female who has history of small bowel Crohn disease and was last seen in January 2014 presents with new symptoms. She now complains of intermittent epigastric pain and nausea. The symptoms started about 4 weeks ago. She seemed to have more discomfort when she wakes up in the morning gets better as the day progresses. She does not feel that meals make her better or worse. She is having intermittent heartburn but not on daily basis. She has not had any episode of emesis and she also denies melena or rectal bleeding. She is having 2-3 semi-formed to formed stools daily. She has sporadic pain in right lower quadrant of her abdomen. She has not lost any weight since her last visit. She does not take NSAIDs.  Current Medications: Current Outpatient Prescriptions  Medication Sig Dispense Refill  . ACIPHEX 20 MG tablet TAKE 1 TABLET EACH BY MOUTH MORNING HAS DISCOUNT CARD ON FILE  30 each  11  . Carboxymethylcellulose Sodium (LUBRICANT EYE DROPS OP) Apply 1 drop to eye as needed. For dry eyes      . Cholecalciferol (VITAMIN D3) 2000 UNITS TABS Take 2,000 Units by mouth daily.       . fluticasone (FLONASE) 50 MCG/ACT nasal spray Place 1 spray into the nose daily as needed. For congestion      . folic acid (FOLVITE) 053 MCG tablet Take 400 mcg by mouth daily.        . mesalamine (PENTASA) 500 MG CR capsule Take 2 capsules (1,000 mg total) by mouth 4 (four) times daily.  720 capsule  4  . Multiple Vitamins-Calcium (VIACTIV MULTI-VITAMIN) CHEW Chew 3 tablets by mouth daily.       . Multiple Vitamins-Minerals (MULTIVITAMIN & MINERAL PO) Take 1 tablet by mouth 2 (two) times daily.       Marland Kitchen nystatin-triamcinolone ointment (MYCOLOG) Apply topically 2 (two) times daily. Applied to anal canal twice daily for 2 weeks and then as needed  60 g  0  . spironolactone (ALDACTONE) 25 MG tablet  Take 25 mg by mouth daily.        No current facility-administered medications for this visit.     Objective: Blood pressure 114/72, pulse 76, temperature 98.4 F (36.9 C), temperature source Oral, resp. rate 18, height 5' 6.5" (1.689 m), weight 184 lb 9.6 oz (83.734 kg). Patient is alert and in no acute distress. Conjunctiva is pink. Sclera is nonicteric Oropharyngeal mucosa is normal. No neck masses or thyromegaly noted. Cardiac exam with regular rhythm normal S1 and S2. No murmur or gallop noted. Lungs are clear to auscultation. Abdomen is full. Bowel sounds are normal. Abdomen is soft and mild midepigastric tenderness. She has mild tenderness in right lower quadrant as well. No organomegaly or masses. No LE edema or clubbing noted. Assessment:  #1. Epigastric pain and nausea. These symptoms started 4 weeks ago. She is not at risk for peptic ulcer disease. These symptoms may be atypical manifestation of GERD and that Rebaprazole is not working anymore. Symptoms are not suggestive of relapse of Crohn disease unless she has developed gastric or duodenal Crohn's. She had her gallbladder removed several years ago. #2. History of small bowel Crohn's disease. She appears to be stable. Her CRP was mildly elevated in March 2014 at 0.8.    Plan:  Discontinue Rebaprazole. Pantoprazole 40 mg by mouth every morning. Patient will go to lab for LFTs,  serum lipase and CRP. If blood work is normal and she does not feel better within 2 weeks will consider abdominopelvic CT.

## 2013-02-17 NOTE — Addendum Note (Signed)
Addended by: Rogene Houston on: 02/17/2013 01:34 PM   Modules accepted: Orders, Medications

## 2013-02-17 NOTE — Patient Instructions (Signed)
Physician will contact you with the results of blood work.

## 2013-02-27 ENCOUNTER — Telehealth (INDEPENDENT_AMBULATORY_CARE_PROVIDER_SITE_OTHER): Payer: Self-pay | Admitting: *Deleted

## 2013-02-27 NOTE — Telephone Encounter (Signed)
The patient brought by her Hemoccult card to be check. It was negative for blood. Patient was called and given test results.

## 2013-03-05 ENCOUNTER — Other Ambulatory Visit: Payer: Self-pay

## 2013-03-20 ENCOUNTER — Encounter (INDEPENDENT_AMBULATORY_CARE_PROVIDER_SITE_OTHER): Payer: Self-pay

## 2013-04-08 ENCOUNTER — Other Ambulatory Visit (INDEPENDENT_AMBULATORY_CARE_PROVIDER_SITE_OTHER): Payer: Self-pay | Admitting: Internal Medicine

## 2013-04-08 DIAGNOSIS — K5000 Crohn's disease of small intestine without complications: Secondary | ICD-10-CM

## 2013-04-08 MED ORDER — MESALAMINE ER 500 MG PO CPCR: 1000.0000 mg | ORAL_CAPSULE | Freq: Four times a day (QID) | ORAL | Status: DC

## 2013-05-20 ENCOUNTER — Encounter (INDEPENDENT_AMBULATORY_CARE_PROVIDER_SITE_OTHER): Payer: Self-pay | Admitting: Internal Medicine

## 2013-05-20 ENCOUNTER — Ambulatory Visit (INDEPENDENT_AMBULATORY_CARE_PROVIDER_SITE_OTHER): Payer: BC Managed Care – PPO | Admitting: Internal Medicine

## 2013-05-20 VITALS — BP 116/74 | HR 72 | Temp 97.9°F | Resp 18 | Ht 66.5 in | Wt 183.3 lb

## 2013-05-20 DIAGNOSIS — R1013 Epigastric pain: Secondary | ICD-10-CM

## 2013-05-20 DIAGNOSIS — K219 Gastro-esophageal reflux disease without esophagitis: Secondary | ICD-10-CM

## 2013-05-20 DIAGNOSIS — K5090 Crohn's disease, unspecified, without complications: Secondary | ICD-10-CM

## 2013-05-20 LAB — CBC
HCT: 39.1 % (ref 36.0–46.0)
MCH: 28.9 pg (ref 26.0–34.0)
MCHC: 33.8 g/dL (ref 30.0–36.0)
MCV: 85.7 fL (ref 78.0–100.0)
RBC: 4.56 MIL/uL (ref 3.87–5.11)
RDW: 14.7 % (ref 11.5–15.5)

## 2013-05-20 MED ORDER — BUDESONIDE 3 MG PO CP24: 9.0000 mg | ORAL_CAPSULE | ORAL | Status: DC

## 2013-05-20 NOTE — Progress Notes (Signed)
Presenting complaint;  Followup for epigastric pain nausea and Crohn's disease.  Subjective:   patient is 67 year old Caucasian female who has history of ileocolonic Crohn's disease who presents for scheduled visit. She was last seen 3 months ago when she was complaining of epigastric pain and nausea. PPI was changed. Following her last visit she also had CRP which was normal. She is not having any more episodes of nausea or epigastric pain but she now complains of intermittent right lower quadrant abdominal pain. She woke up around 3 AM today. She has had a few more episodes waking her up in the middle of night with this pain. Pain is either relieved spontaneously after several minutes or following a bowel movement. She denies fever chills. She denies melena or rectal bleeding. At times her urine is dark but she has not seen any blood or has dysuria. Since her last visit she has had 2 or 3 episodes of dysphagia relieved spontaneously. She had her esophagus stretched possibly over 10 years ago.  Current Medications: Current Outpatient Prescriptions  Medication Sig Dispense Refill  . Carboxymethylcellulose Sodium (LUBRICANT EYE DROPS OP) Apply 1 drop to eye as needed. For dry eyes      . Cholecalciferol (VITAMIN D3) 2000 UNITS TABS Take 2,000 Units by mouth daily.       . fluticasone (FLONASE) 50 MCG/ACT nasal spray Place 1 spray into the nose daily as needed. For congestion      . folic acid (FOLVITE) 300 MCG tablet Take 400 mcg by mouth daily.        . mesalamine (PENTASA) 500 MG CR capsule Take 2 capsules (1,000 mg total) by mouth 4 (four) times daily.  720 capsule  4  . Multiple Vitamins-Calcium (VIACTIV MULTI-VITAMIN) CHEW Chew 3 tablets by mouth daily.       . Multiple Vitamins-Minerals (MULTIVITAMIN & MINERAL PO) Take 1 tablet by mouth 2 (two) times daily.       Marland Kitchen nystatin-triamcinolone ointment (MYCOLOG) Apply topically 2 (two) times daily. Applied to anal canal twice daily for 2 weeks and  then as needed  60 g  0  . pantoprazole (PROTONIX) 40 MG tablet Take 1 tablet (40 mg total) by mouth 2 (two) times daily.  60 tablet  3  . spironolactone (ALDACTONE) 25 MG tablet Take 25 mg by mouth daily.        No current facility-administered medications for this visit.     Objective: Blood pressure 116/74, pulse 72, temperature 97.9 F (36.6 C), temperature source Oral, resp. rate 18, height 5' 6.5" (1.689 m), weight 183 lb 4.8 oz (83.144 kg). Patient is alert and in no acute distress. Conjunctiva is pink. Sclera is nonicteric Oropharyngeal mucosa is normal. No neck masses or thyromegaly noted. Cardiac exam with regular rhythm normal S1 and S2. No murmur or gallop noted. Lungs are clear to auscultation. Abdomen his fold. Bowel sounds are normal. Soft abdomen with fullness and mild tenderness in right lower quadrant and right midabdomen. No organomegaly or masses. No LE edema or clubbing noted.  Labs/studies Results: Hemoccult was negative on 01/30/2013. CRP was 0.5 on 02/17/2013.   Assessment:  #1. Epigastric pain and nausea has completely resolved with double dose PPI. #2. Patient has developed right lower quadrant abdominal pain and tenderness suspicious for relapse of her Crohn's disease.. She does not appear to be acutely ill #3.GERD. Symptoms well controlled with PPI. #4. Solid food dysphagia. Possibly secondary to esophageal spasm. If this symptom gets worse we will consider  further evaluation.   Plan:  Patient will go to the lab for CBC and CRP. Continue Pentasa at 1 g by mouth 4 times a day. Budesonide 9 mg by mouth daily for 2 weeks, followed by 6 mg by mouth daily for 2 weeks and 3 mg by mouth daily for 2 weeks and stop. She will call with progress report in 6 weeks. Can take Colace 100 or 200 mg by mouth daily when necessary but should not take OTC laxatives. Office visit in 4 months.

## 2013-05-20 NOTE — Patient Instructions (Addendum)
Physician will contact you with results of blood work. Budesonide or Entocort 9 mg daily for 2 weeks; 6 mg daily for 2 weeks; he milligrams daily for 2 weeks and stop. Can take Colace 100 or 200 mg by mouth daily or as needed for constipation. Notify if swallowing difficulty occurs more frequently.

## 2013-06-05 ENCOUNTER — Other Ambulatory Visit: Payer: Self-pay

## 2013-07-02 ENCOUNTER — Other Ambulatory Visit (INDEPENDENT_AMBULATORY_CARE_PROVIDER_SITE_OTHER): Payer: Self-pay | Admitting: Internal Medicine

## 2013-07-11 ENCOUNTER — Telehealth (INDEPENDENT_AMBULATORY_CARE_PROVIDER_SITE_OTHER): Payer: Self-pay | Admitting: *Deleted

## 2013-07-11 NOTE — Telephone Encounter (Signed)
Bianco called this morning with a progress report. She has just completed the Budesonide,(Entocort EC). She states that she is doing just fine. Patient was advised that Dr.Rehman would be made aware, if he had further recommendation we would let her know.  Otherwise patient is to contact us if she has any problems.

## 2013-07-14 NOTE — Telephone Encounter (Signed)
Glad that patient is feeling better with Budesonide. No further recommendations at this time. If symptoms relapse patient should let us know.

## 2013-07-14 NOTE — Telephone Encounter (Signed)
Patient has been made aware to call our office if she has any problems.

## 2013-09-22 ENCOUNTER — Ambulatory Visit (INDEPENDENT_AMBULATORY_CARE_PROVIDER_SITE_OTHER): Payer: BC Managed Care – PPO | Admitting: Internal Medicine

## 2013-10-30 ENCOUNTER — Encounter (INDEPENDENT_AMBULATORY_CARE_PROVIDER_SITE_OTHER): Payer: Self-pay | Admitting: Internal Medicine

## 2013-10-30 ENCOUNTER — Ambulatory Visit (INDEPENDENT_AMBULATORY_CARE_PROVIDER_SITE_OTHER): Payer: BC Managed Care – PPO | Admitting: Internal Medicine

## 2013-10-30 VITALS — BP 110/74 | HR 72 | Temp 97.3°F | Ht 66.5 in | Wt 178.3 lb

## 2013-10-30 DIAGNOSIS — K5090 Crohn's disease, unspecified, without complications: Secondary | ICD-10-CM

## 2013-10-30 DIAGNOSIS — K219 Gastro-esophageal reflux disease without esophagitis: Secondary | ICD-10-CM

## 2013-10-30 NOTE — Progress Notes (Deleted)
Subjective:     Patient ID: Gail Henry, female   DOB: March 25, 1946, 68 y.o.   MRN: [id number]  HPI   Review of Systems     Objective:   Physical Exam     Assessment:     ***    Plan:     ***

## 2013-10-30 NOTE — Progress Notes (Signed)
Presenting complaint;  Followup for Crohn's disease and GERD.  Subjective:  Patient is 68 year old Caucasian female who presents for scheduled visit. She was last seen over 5 months ago. Following that visit she was treated for relapse with budesonide. She denies abdominal pain or diarrhea. She generally has 1-2 formed or semi-formed stools daily. She denies melena or rectal bleeding. She states she was without her PPI for 3 days and had intractable heartburn. AcipHex was no more covered and now she is on pantoprazole and she states so far so good. Her weight is about 4 pounds since her last visit. She remains under a lot of stress. She says all she does is work and sleep. She lost a friend a few months ago and her daughter age 83 has moved in with her because of marital problems. She is somewhat nervous this year. She states her first surgery was 40 years ago and second surgery was 20 years ago for Crohn's disease. She is hoping that that would not be the case this year.   Current Medications: Outpatient Encounter Prescriptions as of 10/30/2013  Medication Sig  . Carboxymethylcellulose Sodium (LUBRICANT EYE DROPS OP) Apply 1 drop to eye as needed. For dry eyes  . Cholecalciferol (VITAMIN D3) 2000 UNITS TABS Take 2,000 Units by mouth daily.   . folic acid (FOLVITE) 314 MCG tablet Take 400 mcg by mouth daily.    . mesalamine (PENTASA) 500 MG CR capsule Take 2 capsules (1,000 mg total) by mouth 4 (four) times daily.  . Multiple Vitamins-Calcium (VIACTIV MULTI-VITAMIN) CHEW Chew 3 tablets by mouth daily.   . Multiple Vitamins-Minerals (MULTIVITAMIN & MINERAL PO) Take 1 tablet by mouth 2 (two) times daily.   . pantoprazole (PROTONIX) 40 MG tablet TAKE 1 TABLET BY MOUTH TWICE DAILY  . spironolactone (ALDACTONE) 25 MG tablet Take 25 mg by mouth daily.   . [DISCONTINUED] budesonide (ENTOCORT EC) 3 MG 24 hr capsule Take 3 capsules (9 mg total) by mouth every morning.  . [DISCONTINUED] fluticasone  (FLONASE) 50 MCG/ACT nasal spray Place 1 spray into the nose daily as needed. For congestion  . [DISCONTINUED] nystatin-triamcinolone ointment (MYCOLOG) Apply topically 2 (two) times daily. Applied to anal canal twice daily for 2 weeks and then as needed     Objective: Blood pressure 110/74, pulse 72, temperature 97.3 F (36.3 C), height 5' 6.5" (1.689 m), weight 178 lb 4.8 oz (80.876 kg). Patient is alert and in no acute distress. Conjunctiva is pink. Sclera is nonicteric Oropharyngeal mucosa is normal. No neck masses or thyromegaly noted. Cardiac exam with regular rhythm normal S1 and S2. No murmur or gallop noted. Lungs are clear to auscultation. Abdomen is full but soft and nontender without organomegaly or masses.  No LE edema or clubbing noted.   Assessment:  #1. Small bowel Crohn's disease. She remains in remission. She is presently on Pentasa. She has been tried on 6-MP in the past but was intolerant. Last colonoscopy was in August 23 #2. GERD. Heartburn is well controlled with therapy. Will consider dropping pantoprazole dose on next visit.  Plan:  Will check CRP today. Patient will call should she experience abdominal pain or diarrhea. Office visit in 6 months.

## 2013-10-30 NOTE — Patient Instructions (Signed)
Physician will call with results of CRP. Notify if you have abdominal pain.

## 2013-10-31 LAB — C-REACTIVE PROTEIN: CRP: 0.6 mg/dL — AB (ref <0.60)

## 2013-11-03 ENCOUNTER — Telehealth (INDEPENDENT_AMBULATORY_CARE_PROVIDER_SITE_OTHER): Payer: Self-pay | Admitting: *Deleted

## 2013-11-03 DIAGNOSIS — K5090 Crohn's disease, unspecified, without complications: Secondary | ICD-10-CM

## 2013-11-03 NOTE — Telephone Encounter (Signed)
Per Dr.Rehman the patient will need to have labs drawn in 3 months 

## 2014-01-14 ENCOUNTER — Encounter (INDEPENDENT_AMBULATORY_CARE_PROVIDER_SITE_OTHER): Payer: Self-pay | Admitting: *Deleted

## 2014-01-14 ENCOUNTER — Other Ambulatory Visit (INDEPENDENT_AMBULATORY_CARE_PROVIDER_SITE_OTHER): Payer: Self-pay | Admitting: *Deleted

## 2014-01-14 DIAGNOSIS — K5090 Crohn's disease, unspecified, without complications: Secondary | ICD-10-CM

## 2014-01-24 ENCOUNTER — Other Ambulatory Visit (INDEPENDENT_AMBULATORY_CARE_PROVIDER_SITE_OTHER): Payer: Self-pay | Admitting: Internal Medicine

## 2014-01-26 NOTE — Telephone Encounter (Signed)
Per r.Rehman may fill with 5 refills.

## 2014-02-02 LAB — C-REACTIVE PROTEIN: CRP: <0.5 mg/dL (ref <0.60)

## 2014-04-11 ENCOUNTER — Other Ambulatory Visit (INDEPENDENT_AMBULATORY_CARE_PROVIDER_SITE_OTHER): Payer: Self-pay | Admitting: Internal Medicine

## 2014-05-04 ENCOUNTER — Encounter (INDEPENDENT_AMBULATORY_CARE_PROVIDER_SITE_OTHER): Payer: Self-pay | Admitting: Internal Medicine

## 2014-05-04 ENCOUNTER — Ambulatory Visit (INDEPENDENT_AMBULATORY_CARE_PROVIDER_SITE_OTHER): Payer: BC Managed Care – PPO | Admitting: Internal Medicine

## 2014-05-04 VITALS — BP 112/74 | HR 76 | Temp 97.7°F | Resp 18 | Ht 66.5 in | Wt 182.5 lb

## 2014-05-04 DIAGNOSIS — K219 Gastro-esophageal reflux disease without esophagitis: Secondary | ICD-10-CM

## 2014-05-04 DIAGNOSIS — K5090 Crohn's disease, unspecified, without complications: Secondary | ICD-10-CM

## 2014-05-04 MED ORDER — PANTOPRAZOLE SODIUM 40 MG PO TBEC: 40.0000 mg | DELAYED_RELEASE_TABLET | Freq: Every day | ORAL | Status: DC

## 2014-05-04 NOTE — Patient Instructions (Signed)
Please find the results of recent bone density study and also send [place] a copy. Notify if you have abdominal pain or rectal bleeding.

## 2014-05-04 NOTE — Progress Notes (Signed)
Presenting complaint;  Followup for Crohn's disease and GERD.  Subjective:  Patient is 68 year old Caucasian female with history of ileal and sigmoid colon Crohn's disease who presents for scheduled visit. She was last seen 6 months ago. She feels well. She has no complaints. She does not remember the last time she had heartburn. She denies dysphagia. She has 1-2 formed stools daily. She may have occasional diarrhea with certain foods. She has very good appetite. She denies melena rectal bleeding or abdominal pain. She has not been exercising lately because of foot problems. She had blood work by Dr. Scotty Court last month and was all normal. She also had bone density study in July of this year but does not know the results. She states her last surgery was 20 years ago and one prior to that was 40 years ago. She states she can feel a lot better come next year.   Current Medications: Outpatient Encounter Prescriptions as of 05/04/2014  Medication Sig  . Cholecalciferol (VITAMIN D3) 2000 UNITS TABS Take 2,000 Units by mouth daily.   . folic acid (FOLVITE) 944 MCG tablet Take 400 mcg by mouth daily.    . Multiple Vitamins-Calcium (VIACTIV MULTI-VITAMIN) CHEW Chew 3 tablets by mouth daily.   . Multiple Vitamins-Minerals (MULTIVITAMIN & MINERAL PO) Take 1 tablet by mouth 2 (two) times daily.   . pantoprazole (PROTONIX) 40 MG tablet TAKE 1 TABLET BY MOUTH TWICE DAILY  . PENTASA 500 MG CR capsule TAKE 2 CAPSULES BY MOUTH FOUR TIMES DAILY  . Polyethyl Glycol-Propyl Glycol (SYSTANE OP) Apply to eye. 1-2 DROPS AS NEEDED TO BOTH EYES.  . [DISCONTINUED] Carboxymethylcellulose Sodium (LUBRICANT EYE DROPS OP) Apply 1 drop to eye as needed. For dry eyes  . [DISCONTINUED] spironolactone (ALDACTONE) 25 MG tablet Take 25 mg by mouth daily.      Objective: Blood pressure 112/74, pulse 76, temperature 97.7 F (36.5 C), temperature source Oral, resp. rate 18, height 5' 6.5" (1.689 m), weight 182 lb 8 oz (82.781  kg). Patient is alert and in no acute distress. Conjunctiva is pink. Sclera is nonicteric Oropharyngeal mucosa is normal. No neck masses or thyromegaly noted. Cardiac exam with regular rhythm normal S1 and S2. No murmur or gallop noted. Lungs are clear to auscultation. Abdomen is full. Bowel sounds are normal. Abdomen is soft with mild tenderness on deep palpation in right lower quadrant just below the level of umbilicus some fullness in this area which is chronic. No organomegaly or masses.  No LE edema or clubbing noted.  Labs/studies Results:  CRP on 02/02/2014 was less than 0.5 Lab data from 04/13/2014 WBC 5.6, H&H 13.1 and 39.5 and platelet count 295K Serum sodium 138, potassium 3.8, chloride 102, CO2 27, BUN 12, creatinine 0.70 and glucose 89 Serum calcium 9.0 Bilirubin 0.3, AP 86, AST 24, ALT 21 and albumin 3.8.   Assessment:  #1. Crohn's disease involving distal small bowel and sigmoid colon. Her symptoms have been primarily due to small bowel disease. Focal colonic disease was found on colonoscopy performed on August 2013. She appears to be in remission. #2.GERD. Symptoms are well controlled with double dose PPI. Will try reducing the dose and see how she does.  Plan:  Decrease pantoprazole to 40 mg by mouth every morning. Call if frequent breakthrough symptoms experienced. Patient will try to get [place] a copy of recent bone density study. Office visit in 6 months.

## 2014-05-15 ENCOUNTER — Other Ambulatory Visit: Payer: Self-pay

## 2014-07-22 ENCOUNTER — Encounter (INDEPENDENT_AMBULATORY_CARE_PROVIDER_SITE_OTHER): Payer: Self-pay

## 2014-09-04 ENCOUNTER — Encounter (INDEPENDENT_AMBULATORY_CARE_PROVIDER_SITE_OTHER): Payer: Self-pay

## 2014-10-21 ENCOUNTER — Telehealth (INDEPENDENT_AMBULATORY_CARE_PROVIDER_SITE_OTHER): Payer: Self-pay | Admitting: *Deleted

## 2014-10-21 NOTE — Telephone Encounter (Signed)
A PA was completed for the Pantoprazole 40 mg tablet. ID # [id number]. - Coverage end date is 10/21/2015. Pharmacy was called and made aware.

## 2014-11-03 ENCOUNTER — Encounter (INDEPENDENT_AMBULATORY_CARE_PROVIDER_SITE_OTHER): Payer: Self-pay | Admitting: Internal Medicine

## 2014-11-03 ENCOUNTER — Ambulatory Visit (INDEPENDENT_AMBULATORY_CARE_PROVIDER_SITE_OTHER): Payer: BC Managed Care – PPO | Admitting: Internal Medicine

## 2014-11-03 VITALS — BP 108/76 | HR 74 | Temp 98.2°F | Resp 18 | Ht 66.5 in | Wt 169.8 lb

## 2014-11-03 DIAGNOSIS — K219 Gastro-esophageal reflux disease without esophagitis: Secondary | ICD-10-CM

## 2014-11-03 DIAGNOSIS — K5090 Crohn's disease, unspecified, without complications: Secondary | ICD-10-CM

## 2014-11-03 LAB — CBC
HEMATOCRIT: 41.7 % (ref 36.0–46.0)
Hemoglobin: 14.2 g/dL (ref 12.0–15.0)
MCH: 29.4 pg (ref 26.0–34.0)
MCHC: 34.1 g/dL (ref 30.0–36.0)
MCV: 86.3 fL (ref 78.0–100.0)
MPV: 10 fL (ref 8.6–12.4)
Platelets: 333 10*3/uL (ref 150–400)
RBC: 4.83 MIL/uL (ref 3.87–5.11)
RDW: 14.1 % (ref 11.5–15.5)
WBC: 6.6 10*3/uL (ref 4.0–10.5)

## 2014-11-03 LAB — C-REACTIVE PROTEIN: CRP: 0.5 mg/dL (ref <0.60)

## 2014-11-03 NOTE — Progress Notes (Signed)
Presenting complaint;  Follow-up for Crohn's disease and GERD.  Subjective:  Patient is 69 year old Caucasian female who is here for scheduled visit. She was last seen 6 months ago. She has lost 13 pounds since her visit. She has joined [employer]. She states weight loss is voluntary. She walks every day.  few weeks ago she got constipated and developed aphthous ulcers but the symptoms have resolved. She is having 2-3 stools per day. Consistency varies between normal and loose. She denies melena or rectal bleeding. She says her appetite is good. Heartburn is not as well-controlled as with AcipHex. She was switched to pantoprazole for cost reasons. Last colonoscopy was in August 2013 revealing distal ileum and colonic disease.   Current Medications: Outpatient Encounter Prescriptions as of 11/03/2014  Medication Sig  . Cholecalciferol (VITAMIN D3) 2000 UNITS TABS Take 2,000 Units by mouth daily.   . folic acid (FOLVITE) 951 MCG tablet Take 400 mcg by mouth daily.    . Multiple Vitamins-Calcium (VIACTIV MULTI-VITAMIN) CHEW Chew 3 tablets by mouth daily.   . Multiple Vitamins-Minerals (MULTIVITAMIN & MINERAL PO) Take 1 tablet by mouth 2 (two) times daily.   . pantoprazole (PROTONIX) 40 MG tablet Take 1 tablet (40 mg total) by mouth daily before breakfast.  . PENTASA 500 MG CR capsule TAKE 2 CAPSULES BY MOUTH FOUR TIMES DAILY  . Polyethyl Glycol-Propyl Glycol (SYSTANE OP) Apply to eye. 1-2 DROPS AS NEEDED TO BOTH EYES.     Objective: Blood pressure 108/76, pulse 74, temperature 98.2 F (36.8 C), temperature source Oral, resp. rate 18, height 5' 6.5" (1.689 m), weight 169 lb 12.8 oz (77.021 kg). Patient is alert and in no acute distress. Conjunctiva is pink. Sclera is nonicteric Oropharyngeal mucosa is normal. No neck masses or thyromegaly noted. Cardiac exam with regular rhythm normal S1 and S2. No murmur or gallop noted. Lungs are clear to auscultation. Abdomen is symmetrical. On  palpation is soft. There is fullness and mild tenderness in right lower quadrant. No organomegaly or masses.  No LE edema or clubbing noted.  Labs/studies Results: CRP was less than 0.5 on 02/02/2014  She had normal bone density in July 2015  Assessment:  #1. Ileocolonic Crohn's disease, she appears to be in remission. #2. GERD. She is doing reasonably well with pantoprazole  Plan:  Continue pantoprazole at 40 mg by mouth every morning. Continue Pentasa at 1 g by mouth 4 times a day. Patient informed that she can take OTC stool softener for constipation but should not take laxatives. CBC and CRP. Office visit in 6 months.

## 2014-11-03 NOTE — Patient Instructions (Signed)
Physician will call with results of blood work when completed

## 2014-12-10 ENCOUNTER — Encounter (INDEPENDENT_AMBULATORY_CARE_PROVIDER_SITE_OTHER): Payer: Self-pay | Admitting: *Deleted

## 2015-01-25 ENCOUNTER — Other Ambulatory Visit: Payer: Self-pay

## 2015-04-20 ENCOUNTER — Other Ambulatory Visit (INDEPENDENT_AMBULATORY_CARE_PROVIDER_SITE_OTHER): Payer: Self-pay | Admitting: Internal Medicine

## 2015-05-10 ENCOUNTER — Ambulatory Visit (INDEPENDENT_AMBULATORY_CARE_PROVIDER_SITE_OTHER): Payer: BC Managed Care – PPO | Admitting: Internal Medicine

## 2015-05-17 ENCOUNTER — Ambulatory Visit (INDEPENDENT_AMBULATORY_CARE_PROVIDER_SITE_OTHER): Payer: BC Managed Care – PPO | Admitting: Internal Medicine

## 2015-05-17 ENCOUNTER — Encounter (INDEPENDENT_AMBULATORY_CARE_PROVIDER_SITE_OTHER): Payer: Self-pay | Admitting: Internal Medicine

## 2015-05-17 VITALS — BP 120/82 | HR 72 | Temp 98.4°F | Resp 18 | Ht 66.5 in | Wt 167.3 lb

## 2015-05-17 DIAGNOSIS — K219 Gastro-esophageal reflux disease without esophagitis: Secondary | ICD-10-CM | POA: Diagnosis not present

## 2015-05-17 DIAGNOSIS — K5000 Crohn's disease of small intestine without complications: Secondary | ICD-10-CM | POA: Diagnosis not present

## 2015-05-17 NOTE — Progress Notes (Signed)
Presenting complaint;  Follow-up for small bowel Crohn's disease and GERD.  Subjective:  Gail Henry is 69 year old Caucasian female who is here for scheduled visit. She was last seen on 11/03/2014. She states heartburns well controlled with therapy. She denies right-sided abdominal pain melena or rectal bleeding or diarrhea but she is been having dark urine intermittently since September 2016 and she also has noted left flank pain radiating to her groin. She denies gross hematuria. She has good appetite. She has joined [employer]. She has lost 2 pounds at the last visit. She is hoping to lose more weight. She walks for one are at least twice a week. She has not experienced fever chills nausea or vomiting. She is been having intermittent dysphagia. She says by thoroughly chewing her food she is able to overcome this symptom. She saw Dr. Scotty Court earlier today and had complete blood work including CRP. She also had urine analysis and culture done today.    Current Medications: Outpatient Encounter Prescriptions as of 05/17/2015  Medication Sig  . Cholecalciferol (VITAMIN D3) 2000 UNITS TABS Take 2,000 Units by mouth daily.   . folic acid (FOLVITE) 017 MCG tablet Take 400 mcg by mouth daily.    . Multiple Vitamins-Calcium (VIACTIV MULTI-VITAMIN) CHEW Chew 3 tablets by mouth daily.   . Multiple Vitamins-Minerals (MULTIVITAMIN & MINERAL PO) Take 1 tablet by mouth 2 (two) times daily.   . pantoprazole (PROTONIX) 40 MG tablet Take 1 tablet (40 mg total) by mouth daily before breakfast.  . PENTASA 500 MG CR capsule TAKE 2 CAPSULES BY MOUTH FOUR TIMES DAILY  . Polyethyl Glycol-Propyl Glycol (SYSTANE OP) Apply to eye. 1-2 DROPS AS NEEDED TO BOTH EYES.   No facility-administered encounter medications on file as of 05/17/2015.     Objective: Blood pressure 120/82, pulse 72, temperature 98.4 F (36.9 C), temperature source Oral, resp. rate 18, height 5' 6.5" (1.689 m), weight 167 lb 4.8 oz (75.887  kg). Patient is alert and in no acute distress. Conjunctiva is pink. Sclera is nonicteric Oropharyngeal mucosa is normal. No neck masses or thyromegaly noted. Cardiac exam with regular rhythm normal S1 and S2. No murmur or gallop noted. Lungs are clear to auscultation. Abdomen is symmetrical. Bowel sounds are normal. On palpation abdomen is soft. She has mild vague tenderness in left lower quadrant but she has localized mild to moderate tenderness in right midabdomen where she has fullness but no masses. No LE edema or clubbing noted.  Labs/studies Results: Lab data from 11/03/2014  WBC 6.6, H&H 14.1 41.7 and platelet count 333K   CRP was 0.5.  Assessment:  #1. Small bowel Crohn's disease. She has had 2 bowel surgeries in the remote past. Second surgery was in 1995. She appears to be in remission based on her symptoms but she appears to be more tender in the right midabdomen today than on previous exams. Will review her lab studies and monitor her symptoms over the next 2 weeks. If she experiences right-sided pain will consider six-week course with budesonide. #2. Left-sided abdominal pain appears to be renal colic given that she has history of left kidney stone and dark urine may be indicated above hematuria. Results of urinalysis are pending. #3. GERD. Symptoms are well controlled with therapy. #4. Weight loss. Weight loss appears to be volume 3. She has lost 15 pounds in the last 1 year.  Plan:  Patient advised to monitor her symptoms over the next few weeks and call with progress report. Patient advised to call  if she begins to experience right-sided abdominal pain. Will review lab studies from Dr. Rayna Sexton office. Unless she develops right-sided abdominal pain will plan to see her back in 6 months.

## 2015-05-17 NOTE — Patient Instructions (Addendum)
Will review your blood work when copy received from Dr. Rayna Sexton office. Notify if you have right-sided abdominal pain.

## 2015-05-24 ENCOUNTER — Other Ambulatory Visit (INDEPENDENT_AMBULATORY_CARE_PROVIDER_SITE_OTHER): Payer: Self-pay | Admitting: Internal Medicine

## 2015-06-23 ENCOUNTER — Encounter (INDEPENDENT_AMBULATORY_CARE_PROVIDER_SITE_OTHER): Payer: Self-pay

## 2015-06-23 ENCOUNTER — Telehealth (INDEPENDENT_AMBULATORY_CARE_PROVIDER_SITE_OTHER): Payer: Self-pay | Admitting: *Deleted

## 2015-06-23 DIAGNOSIS — K5090 Crohn's disease, unspecified, without complications: Secondary | ICD-10-CM

## 2015-06-23 NOTE — Telephone Encounter (Signed)
Per Dr.Rehman the patient will need to have labs drawn.

## 2015-06-29 LAB — C-REACTIVE PROTEIN: CRP: <0.5 mg/dL (ref <0.60)

## 2015-08-17 ENCOUNTER — Encounter (INDEPENDENT_AMBULATORY_CARE_PROVIDER_SITE_OTHER): Payer: Self-pay | Admitting: Internal Medicine

## 2015-11-18 ENCOUNTER — Telehealth (INDEPENDENT_AMBULATORY_CARE_PROVIDER_SITE_OTHER): Payer: Self-pay | Admitting: Internal Medicine

## 2015-11-18 ENCOUNTER — Ambulatory Visit (HOSPITAL_COMMUNITY)
Admission: RE | Admit: 2015-11-18 | Discharge: 2015-11-18 | Disposition: A | Discharge status: Home / Self Care | Payer: BC Managed Care – PPO | Source: Ambulatory Visit | Attending: Internal Medicine | Admitting: Internal Medicine

## 2015-11-18 ENCOUNTER — Other Ambulatory Visit (HOSPITAL_COMMUNITY)
Admission: RE | Admit: 2015-11-18 | Discharge: 2015-11-18 | Disposition: A | Discharge status: Home / Self Care | Payer: BC Managed Care – PPO | Source: Ambulatory Visit | Attending: Internal Medicine | Admitting: Internal Medicine

## 2015-11-18 ENCOUNTER — Encounter (INDEPENDENT_AMBULATORY_CARE_PROVIDER_SITE_OTHER): Payer: Self-pay | Admitting: Internal Medicine

## 2015-11-18 ENCOUNTER — Telehealth (INDEPENDENT_AMBULATORY_CARE_PROVIDER_SITE_OTHER): Payer: Self-pay | Admitting: *Deleted

## 2015-11-18 ENCOUNTER — Ambulatory Visit (INDEPENDENT_AMBULATORY_CARE_PROVIDER_SITE_OTHER): Payer: BC Managed Care – PPO | Admitting: Internal Medicine

## 2015-11-18 VITALS — BP 174/64 | HR 72 | Temp 98.5°F | Ht 66.5 in | Wt 162.2 lb

## 2015-11-18 DIAGNOSIS — K5090 Crohn's disease, unspecified, without complications: Secondary | ICD-10-CM | POA: Insufficient documentation

## 2015-11-18 DIAGNOSIS — R109 Unspecified abdominal pain: Secondary | ICD-10-CM | POA: Insufficient documentation

## 2015-11-18 DIAGNOSIS — N200 Calculus of kidney: Secondary | ICD-10-CM | POA: Insufficient documentation

## 2015-11-18 DIAGNOSIS — K5000 Crohn's disease of small intestine without complications: Secondary | ICD-10-CM | POA: Diagnosis not present

## 2015-11-18 DIAGNOSIS — R1012 Left upper quadrant pain: Secondary | ICD-10-CM | POA: Diagnosis not present

## 2015-11-18 LAB — CBC WITH DIFFERENTIAL/PLATELET
BASOS ABS: 0 10*3/uL (ref 0.0–0.1)
BASOS PCT: 0 %
EOS PCT: 1 %
Eosinophils Absolute: 0.1 10*3/uL (ref 0.0–0.7)
HEMATOCRIT: 40.3 % (ref 36.0–46.0)
Hemoglobin: 13.4 g/dL (ref 12.0–15.0)
Lymphocytes Relative: 30 %
Lymphs Abs: 2.2 10*3/uL (ref 0.7–4.0)
MCH: 29.5 pg (ref 26.0–34.0)
MCHC: 33.3 g/dL (ref 30.0–36.0)
MCV: 88.8 fL (ref 78.0–100.0)
MONO ABS: 0.6 10*3/uL (ref 0.1–1.0)
Monocytes Relative: 8 %
NEUTROS ABS: 4.3 10*3/uL (ref 1.7–7.7)
Neutrophils Relative %: 61 %
PLATELETS: 307 10*3/uL (ref 150–400)
RBC: 4.54 MIL/uL (ref 3.87–5.11)
RDW: 13.5 % (ref 11.5–15.5)
WBC: 7.1 10*3/uL (ref 4.0–10.5)

## 2015-11-18 LAB — URINALYSIS, ROUTINE W REFLEX MICROSCOPIC
BILIRUBIN URINE: NEGATIVE
GLUCOSE, UA: NEGATIVE mg/dL
HGB URINE DIPSTICK: NEGATIVE
KETONES UR: NEGATIVE mg/dL
Leukocytes, UA: NEGATIVE
Nitrite: NEGATIVE
PROTEIN: NEGATIVE mg/dL
Specific Gravity, Urine: 1.01 (ref 1.005–1.030)
pH: 5.5 (ref 5.0–8.0)

## 2015-11-18 LAB — SEDIMENTATION RATE: SED RATE: 44 mm/h — AB (ref 0–22)

## 2015-11-18 LAB — POCT I-STAT CREATININE: CREATININE: 0.7 mg/dL (ref 0.44–1.00)

## 2015-11-18 MED ORDER — CIPROFLOXACIN HCL 500 MG PO TABS: 500.0000 mg | ORAL_TABLET | Freq: Two times a day (BID) | ORAL | Status: DC

## 2015-11-18 MED ORDER — IOPAMIDOL (ISOVUE-300) INJECTION 61%
100.0000 mL | Freq: Once | INTRAVENOUS | Status: AC | PRN
  Administered 2015-11-18: 100 mL via INTRAVENOUS

## 2015-11-18 MED ORDER — DIATRIZOATE MEGLUMINE & SODIUM 66-10 % PO SOLN
ORAL | Status: AC
  Filled 2015-11-18: qty 30

## 2015-11-18 MED ORDER — METRONIDAZOLE 500 MG PO TABS: 500.0000 mg | ORAL_TABLET | Freq: Three times a day (TID) | ORAL | Status: DC

## 2015-11-18 NOTE — Patient Instructions (Signed)
Sedrate, CBC, CT abdomen/pelvis with CM.  Further recommendations to follow.

## 2015-11-18 NOTE — Telephone Encounter (Signed)
Patient called. She has been experiencing left side pain for a few months but in the last two weeks it has become more intense. No changes in bowel movements , afebrile. Discussed with Dr.Rehman. He ask that the patient be seen in the office today by Terri. No labs or diagnostic until assessment is done. Working on an appointment , then the patient will be made aware.

## 2015-11-18 NOTE — Telephone Encounter (Signed)
Patient was seen by Terri and Diagnostics and Labs were ordered. Terri ask that Radiology page Dr.Rehman with results.

## 2015-11-18 NOTE — Progress Notes (Addendum)
Subjective:    Patient ID: Gail Henry, female    DOB: November 01, 1945, 70 y.o.   MRN: [id number]  HPI  Presents today with c/o she is having left sided abdominal pain. She says it hurts to walk and cough. Hurts worse after she voids.No burning on urination.  She tells me her urine was dark in February. She is drinking enough fluids as far as she knows. She drinks 3-4 16 oz bottles of water a day. Her appetite is good. No weight loss. She has some nausea.   Her appetite is okay.  She is having one BM a day and sometimes she will have one every 2 days.  No change in her stools.  She tells me usually has pain on her rt side when she has a Crohns flare. There has been no fever.    Hx of  small bowel Crohn's disease. She has had 2 bowel surgeries in the remote past. Second surgery was in 1995.    09/03/2012 CT entero abdomen/pelvis with c/m IMPRESSION:   1.  Very mild enhancement and thickening of the mucosa at the enterocolonic anastomoses in the right lower quadrant of the abdomen.  Findings suggest mild Crohn's inflammation. 2.  There is no evidence for penetrating disease, abscess formation or evidence of bowel obstruction.     03/15/2012:   Colonoscopy  Indications:  Patient is 70 year old Caucasian female was undergoing average risk screening colonoscopy. She has small bowel Crohn disease and has undergone 2 surgeries in the past most recently in 1995.  Impression:   Mucosal nodularity with ulceration at ileocecal valve along with luminal narrowing. Biopsy taken from this area. TI not examined. Small cecal polyp ablated via cold biopsy. Focal ulcerated nodular mucosa at sigmoid colon ostomy hyperplastic polyp. Biopsy taken. Scattered sigmoid colon diverticula. External hemorrhoids and 3 anal papillae.  Biopsy results reviewed with patient. Colonic polyp is tubular adenoma. Biopsy from anastomosis shows active disease. Op see from sigmoid colon shows active disease.     Review  of Systems Past Medical History  Diagnosis Date  . Crohn's disease ([place]) 10/18/2010    SMALL BOWELS  . Abdominal pain 10/18/2010  . Diarrhea 10/18/2010  . Constipation 10/18/2010    Past Surgical History  Procedure Laterality Date  . Laparoscopic cholecystectomy  12/17/96    DEMASON  . Colonoscopy  10/16/2001  . Colonoscopy  03/15/2012    Procedure: COLONOSCOPY;  Surgeon: Rogene Houston, MD;  Location: [facility];  Service: Endoscopy;  Laterality: N/A;  730    Allergies  Allergen Reactions  . Penicillins Hives    Current Outpatient Prescriptions on File Prior to Visit  Medication Sig Dispense Refill  . Cholecalciferol (VITAMIN D3) 2000 UNITS TABS Take 2,000 Units by mouth daily.     . folic acid (FOLVITE) 893 MCG tablet Take 400 mcg by mouth daily.      . Multiple Vitamins-Calcium (VIACTIV MULTI-VITAMIN) CHEW Chew 3 tablets by mouth daily.     . Multiple Vitamins-Minerals (MULTIVITAMIN & MINERAL PO) Take 1 tablet by mouth 2 (two) times daily.     . pantoprazole (PROTONIX) 40 MG tablet TAKE 1 TABLET BY MOUTH DAILY BEFORE BREAKFAST. 90 tablet 3  . PENTASA 500 MG CR capsule TAKE 2 CAPSULES BY MOUTH FOUR TIMES DAILY 720 capsule 11  . Polyethyl Glycol-Propyl Glycol (SYSTANE OP) Apply to eye. 1-2 DROPS AS NEEDED TO BOTH EYES.     No current facility-administered medications on file prior to visit.  Objective:   Physical Exam Blood pressure 174/64, pulse 72, temperature 98.5 F (36.9 C), height 5' 6.5" (1.689 m), weight 162 lb 3.2 oz (73.573 kg).  Alert and oriented. Skin warm and dry. Oral mucosa is moist.   . Sclera anicteric, conjunctivae is pink. Thyroid not enlarged. No cervical lymphadenopathy. Lungs clear. Heart regular rate and rhythm.  Abdomen is soft. Bowel sounds are positive. No hepatomegaly. No abdominal masses felt. Patient is very tender in her left lower quadrant.   No edema to lower extremities.          Assessment & Plan:  Crohn's. ? Flare vs  diverticulitis.   CBC, sedrate, UA, CT  Abdomen/pelvis with CM.  Rx for Cipro and Flagyl sent to her pharmacy.

## 2015-11-18 NOTE — Telephone Encounter (Signed)
Patient was called earlier today to give her the results of CT. I reviewed CT films as well as CBC and urinalysis. I asked patient's to hold old off taking antibiotics. Patient states pain has not increased in intensity since she was seen in the office. She is afebrile. She does not have hydronephrosis. She also does not have hematuria. Therefore I doubt that she has renal colic. Suspect she may have diverticulitis in spite of negative CT. Patient will start antibiotics tomorrow morning and stop by the office to pick up pain medication. Will prescribe hydrocodone/acetaminophen 5/325 one every 6 when necessary 30 without refill. Patient advised to call me she has temp greater than 101 or pain gets worse. She can take Tylenol tonight.

## 2015-11-18 NOTE — Progress Notes (Signed)
Report called to Dr Laural Golden. He spoke with patient and we let her go home.

## 2015-11-19 ENCOUNTER — Other Ambulatory Visit (INDEPENDENT_AMBULATORY_CARE_PROVIDER_SITE_OTHER): Payer: Self-pay | Admitting: Internal Medicine

## 2015-11-19 MED ORDER — HYDROCODONE-ACETAMINOPHEN 5-325 MG PO TABS: 1.0000 | ORAL_TABLET | Freq: Four times a day (QID) | ORAL | Status: DC | PRN

## 2015-11-19 NOTE — Telephone Encounter (Signed)
Patient presented to the [facility] for Dr.Rehman to assess. Temp at was 97.3. Patient was given a Rx for Hydrocodone 5/325 mg #30 no refills. She was advised to started the Cipro/Flagyl. Patient will be called for a progress report 11/22/15.

## 2015-11-19 NOTE — Telephone Encounter (Signed)
Noted. Rx ready. Have talked with Dr.Rehman this morning.

## 2015-11-19 NOTE — Telephone Encounter (Signed)
Per Dr.Rehman - print rx as it did not print at hospital.

## 2015-11-24 ENCOUNTER — Telehealth (INDEPENDENT_AMBULATORY_CARE_PROVIDER_SITE_OTHER): Payer: Self-pay | Admitting: *Deleted

## 2015-11-24 NOTE — Telephone Encounter (Signed)
Patient left a message. She thought that Terri had called her and she was returning the call. She also gave a progress report. Pain is gone. It does not hurt to walk. Only if she presses on her abdomen she can feel the some pain.. She continues to take the antibiotic and will until she is done. She also states that she will keep her May 30 th appointment with Dr.Rehman.  Gail Henry - patient says if you need to talk to her call [phone] ext [phone]. 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 She is teaching a class.

## 2015-11-24 NOTE — Telephone Encounter (Signed)
Dr.Rehman was made aware. He states that the patient should continue to take the medication and see him May 30 th.

## 2015-11-24 NOTE — Telephone Encounter (Signed)
Message left on phone

## 2015-12-28 ENCOUNTER — Ambulatory Visit (INDEPENDENT_AMBULATORY_CARE_PROVIDER_SITE_OTHER): Payer: BC Managed Care – PPO | Admitting: Internal Medicine

## 2015-12-28 ENCOUNTER — Encounter (INDEPENDENT_AMBULATORY_CARE_PROVIDER_SITE_OTHER): Payer: Self-pay | Admitting: Internal Medicine

## 2015-12-28 VITALS — BP 102/68 | HR 70 | Temp 98.5°F | Resp 18 | Ht 66.5 in | Wt 158.7 lb

## 2015-12-28 DIAGNOSIS — K219 Gastro-esophageal reflux disease without esophagitis: Secondary | ICD-10-CM

## 2015-12-28 DIAGNOSIS — K5000 Crohn's disease of small intestine without complications: Secondary | ICD-10-CM | POA: Diagnosis not present

## 2015-12-28 NOTE — Progress Notes (Signed)
Presenting complaint;  Follow-up for Crohn's disease and GERD.  Subjective:  Patient is 70 year old Caucasian female who is here for scheduled visit. She was last seen on 11/18/2014 with acute onset of left-sided abdominal pain. Abdominopelvic CT was obtained and reveals left urolithiasis but no evidence of bowel wall thickening or diverticulitis. She was treated with Cipro and metronidazole with the thought that she may have diverticulitis. She started to feel better after 2 or 3 days. She was advised to talk with Dr. Barbie Haggis about seeing a urologist. She has not done so. She denies dysuria or immature area. She does experience intermittent left flank pain. She has good appetite. She is not having any pain and right low quadrant. She has lost 4 pounds since her last visit. She states she has changed her eating habits. She is happy that she has lost few pounds although she's not trying to do so. She has one to 2 formed stools per day. She states her stools were very dark while she was in antibiotics but now normal. Most of her stools are formed. She walks regularly. She does not take any NSAIDs. Heartburn is well controlled with therapy.   Current Medications: Outpatient Encounter Prescriptions as of 12/28/2015  Medication Sig  . Cholecalciferol (VITAMIN D3) 2000 UNITS TABS Take 2,000 Units by mouth daily.   . folic acid (FOLVITE) 502 MCG tablet Take 400 mcg by mouth daily.    Marland Kitchen HYDROcodone-acetaminophen (NORCO/VICODIN) 5-325 MG tablet Take 1 tablet by mouth every 6 (six) hours as needed for moderate pain.  . Multiple Vitamins-Calcium (VIACTIV MULTI-VITAMIN) CHEW Chew 3 tablets by mouth daily.   . Multiple Vitamins-Minerals (MULTIVITAMIN & MINERAL PO) Take 1 tablet by mouth 2 (two) times daily.   . pantoprazole (PROTONIX) 40 MG tablet TAKE 1 TABLET BY MOUTH DAILY BEFORE BREAKFAST.  Marland Kitchen PENTASA 500 MG CR capsule TAKE 2 CAPSULES BY MOUTH FOUR TIMES DAILY  . Polyethyl Glycol-Propyl Glycol (SYSTANE OP)  Apply to eye. 1-2 DROPS AS NEEDED TO BOTH EYES.  . [DISCONTINUED] ciprofloxacin (CIPRO) 500 MG tablet Take 1 tablet (500 mg total) by mouth 2 (two) times daily. (Patient not taking: Reported on 12/28/2015)  . [DISCONTINUED] metroNIDAZOLE (FLAGYL) 500 MG tablet Take 1 tablet (500 mg total) by mouth 3 (three) times daily. (Patient not taking: Reported on 12/28/2015)   No facility-administered encounter medications on file as of 12/28/2015.     Objective: Blood pressure 102/68, pulse 70, temperature 98.5 F (36.9 C), temperature source Oral, resp. rate 18, height 5' 6.5" (1.689 m), weight 158 lb 11.2 oz (71.986 kg). Patient is alert and in no acute distress. Conjunctiva is pink. Sclera is nonicteric Oropharyngeal mucosa is normal. No neck masses or thyromegaly noted. Cardiac exam with regular rhythm normal S1 and S2. No murmur or gallop noted. Lungs are clear to auscultation. Abdomen abdomen is symmetrical with normal bowel sounds. On palpation it is soft and nontender without organomegaly or masses. No LE edema or clubbing noted.  Labs/studies Results: Lab data from 11/18/2015  CBC 7.1, H&H 13.4 and 40.3 and platelet count 307K   urinalysis was negative for white cells and red cells or nitrites. Sedimentation rate was 44.  Abdominopelvic CT with contrast in 11/18/2015 Nonobstructing left renal stones. No evidence of bowel wall thickening. Scattered diverticula without changes of diverticulitis.   Assessment:  #1. Left-sided abdominal pain most likely due to diverticulitis but could've been due to urolithiasis. She is not asymptomatic. She needs to be evaluated by urologist to determine whether  she needs further therapy. #2. Small bowel Crohn's disease. She remains in remission. #3. Chronic GERD. Symptoms are well controlled with therapy.   Plan:  Patient will continue Pentasa 1 g by mouth 4 times a day. Continue pantoprazole at 40 mg by mouth every morning. Office visit in one  year.

## 2015-12-28 NOTE — Patient Instructions (Signed)
Notify if you have abdominal pain rectal bleeding or diarrhea. Occult 1 if stool turns black.

## 2016-04-21 ENCOUNTER — Other Ambulatory Visit (INDEPENDENT_AMBULATORY_CARE_PROVIDER_SITE_OTHER): Payer: Self-pay | Admitting: Internal Medicine

## 2016-05-22 ENCOUNTER — Other Ambulatory Visit (INDEPENDENT_AMBULATORY_CARE_PROVIDER_SITE_OTHER): Payer: Self-pay | Admitting: Internal Medicine

## 2016-06-06 ENCOUNTER — Ambulatory Visit (INDEPENDENT_AMBULATORY_CARE_PROVIDER_SITE_OTHER): Payer: BC Managed Care – PPO | Admitting: Urology

## 2016-06-06 DIAGNOSIS — N201 Calculus of ureter: Secondary | ICD-10-CM

## 2016-08-08 ENCOUNTER — Encounter (INDEPENDENT_AMBULATORY_CARE_PROVIDER_SITE_OTHER): Payer: Self-pay

## 2016-11-10 ENCOUNTER — Encounter (INDEPENDENT_AMBULATORY_CARE_PROVIDER_SITE_OTHER): Payer: Self-pay | Admitting: Internal Medicine

## 2017-04-10 ENCOUNTER — Ambulatory Visit (INDEPENDENT_AMBULATORY_CARE_PROVIDER_SITE_OTHER): Payer: BC Managed Care – PPO | Admitting: Internal Medicine

## 2017-04-10 ENCOUNTER — Encounter (INDEPENDENT_AMBULATORY_CARE_PROVIDER_SITE_OTHER): Payer: Self-pay | Admitting: Internal Medicine

## 2017-04-10 VITALS — BP 140/78 | HR 74 | Temp 98.4°F | Resp 18 | Ht 66.5 in | Wt 166.3 lb

## 2017-04-10 DIAGNOSIS — K5080 Crohn's disease of both small and large intestine without complications: Secondary | ICD-10-CM

## 2017-04-10 DIAGNOSIS — K219 Gastro-esophageal reflux disease without esophagitis: Secondary | ICD-10-CM

## 2017-04-10 NOTE — Progress Notes (Signed)
Presenting complaint;  Follow-up for Crohn's disease and GERD.  Subjective:  Patient is 71 year old Caucasian female who is here for scheduled visit. She was last seen on 12/28/2015. She feels she is doing well. She has one to 2 formed stools daily. She rarely has diarrhea. She denies abdominal pain melena or rectal bleeding. She has occasional heartburn with certain foods but she is been having intermittent dysphagia with dry foods. She does not feel it is bad enough to proceed with further evaluation or dilation. She complains of lower back pain. This is more for problem at night when she is not able to sleep on the right site. This pain is not constant. She denies lower extremity weakness or paresthesias. She wants to know how often we see kidney damage due to PPIs. She was told it is rare. Patient tells me that she is planning to retire within the next few months.   Current Medications: Outpatient Encounter Prescriptions as of 04/10/2017  Medication Sig  . Cholecalciferol (VITAMIN D3) 2000 UNITS TABS Take 2,000 Units by mouth daily.   . folic acid (FOLVITE) 599 MCG tablet Take 400 mcg by mouth daily.    Marland Kitchen HYDROcodone-acetaminophen (NORCO/VICODIN) 5-325 MG tablet Take 1 tablet by mouth every 6 (six) hours as needed for moderate pain.  . Multiple Vitamins-Calcium (VIACTIV MULTI-VITAMIN) CHEW Chew 3 tablets by mouth daily.   . Multiple Vitamins-Minerals (MULTIVITAMIN & MINERAL PO) Take 1 tablet by mouth 2 (two) times daily.   . pantoprazole (PROTONIX) 40 MG tablet TAKE 1 TABLET BY MOUTH DAILY BEFORE BREAKFAST.  Marland Kitchen PENTASA 500 MG CR capsule TAKE 2 CAPSULES BY MOUTH FOUR TIMES DAILY  . Polyethyl Glycol-Propyl Glycol (SYSTANE OP) Apply to eye. 1-2 DROPS AS NEEDED TO BOTH EYES.   No facility-administered encounter medications on file as of 04/10/2017.      Objective: Blood pressure 140/78, pulse 74, temperature 98.4 F (36.9 C), temperature source Oral, resp. rate 18, height 5' 6.5" (1.689 m),  weight 166 lb 4.8 oz (75.4 kg). Patient is alert and in no acute distress. Conjunctiva is pink. Sclera is nonicteric Oropharyngeal mucosa is normal. No neck masses or thyromegaly noted. Cardiac exam with regular rhythm normal S1 and S2. No murmur or gallop noted. Lungs are clear to auscultation. Abdomen is symmetrical with low midline scar. On palpation is soft and nontender without organomegaly or masses. Vague fullness in right mid abdomen. No LE edema or clubbing noted.   Assessment:  #1. Ileocolonic Crohn's disease. She appears to be in remission. Last colonoscopy was in August 2013 and also revealed small cecal tubular adenoma.  #2. Chronic GERD. Heartburn is well controlled with therapy. She is having sporadic dysphagia. Will monitor this symptom for now.  Plan:  Patient have blood work by Dr. Scotty Court at the time of physical exam later this year. She will follow with Dr. Scotty Court regarding lower back pain. She will continue antacid and pantoprazole at current dose. Will consider surveillance colonoscopy following next visit in 6 months.

## 2017-04-10 NOTE — Patient Instructions (Addendum)
Call if you have abdominal pain or rectal bleeding. Call if swallowing difficulty becomes more frequent.

## 2017-05-04 ENCOUNTER — Other Ambulatory Visit (INDEPENDENT_AMBULATORY_CARE_PROVIDER_SITE_OTHER): Payer: Self-pay | Admitting: Internal Medicine

## 2017-05-13 ENCOUNTER — Other Ambulatory Visit (INDEPENDENT_AMBULATORY_CARE_PROVIDER_SITE_OTHER): Payer: Self-pay | Admitting: Internal Medicine

## 2017-10-09 ENCOUNTER — Encounter (INDEPENDENT_AMBULATORY_CARE_PROVIDER_SITE_OTHER): Payer: Self-pay | Admitting: Internal Medicine

## 2017-10-09 ENCOUNTER — Ambulatory Visit (INDEPENDENT_AMBULATORY_CARE_PROVIDER_SITE_OTHER): Payer: BC Managed Care – PPO | Admitting: Internal Medicine

## 2017-10-09 VITALS — BP 120/74 | HR 72 | Temp 97.0°F | Resp 18 | Ht 66.5 in | Wt 166.9 lb

## 2017-10-09 DIAGNOSIS — K5080 Crohn's disease of both small and large intestine without complications: Secondary | ICD-10-CM

## 2017-10-09 DIAGNOSIS — I7300 Raynaud's syndrome without gangrene: Secondary | ICD-10-CM

## 2017-10-09 DIAGNOSIS — K219 Gastro-esophageal reflux disease without esophagitis: Secondary | ICD-10-CM | POA: Diagnosis not present

## 2017-10-09 NOTE — Patient Instructions (Addendum)
Can take Pepcid OTC 20 mg at bedtime on as-needed basis. Please asked Dr. Quillian Quince to add CRP to your blood work that she will have in July this year. Colonoscopy to be scheduled in July this year. Monitor hand symptoms until office visit with Dr. Gar Ponto.

## 2017-10-09 NOTE — Progress Notes (Signed)
Presenting complaint;  Follow-up for Crohn's disease and GERD.  Database and subjective:  Patient is 72 year old Caucasian female with several year history of Crohn's disease involving small and large bowel who has undergone 2 surgeries.  Last surgery was in 1995 and one before that was 20 years earlier.  Her last colonoscopy was in August 2013 revealing disease nodularity and ulceration at ileocecal valve along with luminal narrowing.  He also had focal ulceration and nodularity at sigmoid colon.  Small polyp was removed from cecum and it turned out to be tubular adenoma.  Biopsy from ileocecal region as well as sigmoid colon revealed acute and chronic colitis with granulation tissue.  Immunohistochemical stains were negative for CMV.  Patient has been maintained on oral mesalamine. He also has chronic GERD and has been maintained on pantoprazole. She was last seen on April 10, 2017.  Gail Henry is here for scheduled visit.  Since Dr. Scotty Court has retired Dr. Quillian Quince will be her primary care physician.  She will see him in July 2019. She has 1 formed stool daily.  Occasionally she may have a second stool.  She denies melena or rectal bleeding.  She is trying to gradually increase fiber in her diet as tolerated.  She says she has no difficulty with Cheerios.  She has fleeting pain in right mid abdomen and at times in left lower quadrant but it does not last long.  She has not taken pain pill in over 2 years.  She has intermittent heartburn.  She states pantoprazole is not working as well as as it used to.  She is not having any side effects with this medication. Her new complaint is 1 of intermittent white discoloration to fingertips with numbness.  She had this symptom when she came to our office.  She has noted this since her last visit.  She does not have numbness to her feet or legs.  She has no difficulty when weather is warm.  She states her hands are always cold. Her weight has been unchanged  since her last visit. Patient states she will be retiring next year but she will use her leave starting this summer until her retirement next year. She walks at least 5 miles a week.  Current Medications: Outpatient Encounter Medications as of 10/09/2017  Medication Sig  . Cholecalciferol (VITAMIN D3) 2000 UNITS TABS Take 2,000 Units by mouth daily.   . folic acid (FOLVITE) 191 MCG tablet Take 400 mcg by mouth daily.    Marland Kitchen HYDROcodone-acetaminophen (NORCO/VICODIN) 5-325 MG tablet Take 1 tablet by mouth every 6 (six) hours as needed for moderate pain.  . Multiple Vitamins-Calcium (VIACTIV MULTI-VITAMIN) CHEW Chew 3 tablets by mouth daily.   . Multiple Vitamins-Minerals (MULTIVITAMIN & MINERAL PO) Take 1 tablet by mouth 2 (two) times daily.   . pantoprazole (PROTONIX) 40 MG tablet TAKE 1 TABLET BY MOUTH DAILY BEFORE BREAKFAST.  Marland Kitchen PENTASA 500 MG CR capsule TAKE 2 CAPSULES BY MOUTH FOUR TIMES DAILY  . Polyethyl Glycol-Propyl Glycol (SYSTANE OP) Apply to eye. 1-2 DROPS AS NEEDED TO BOTH EYES.   No facility-administered encounter medications on file as of 10/09/2017.      Objective: Blood pressure 120/74, pulse 72, temperature (!) 97 F (36.1 C), temperature source Oral, resp. rate 18, height 5' 6.5" (1.689 m), weight 166 lb 14.4 oz (75.7 kg). Patient is alert and in no acute distress. Conjunctiva is pink. Sclera is nonicteric Oropharyngeal mucosa is normal. No neck masses or thyromegaly noted. Cardiac exam with  regular rhythm normal S1 and S2. No murmur or gallop noted. Lungs are clear to auscultation. Abdomen is symmetrical.  She has long midline scar along with horizontal scar in right mid abdomen below the level of umbilicus and an appendectomy scar.  Bowel sounds are normal.  On palpation it is soft with mild tenderness at LLQ and right mid abdomen.  No organomegaly or masses. No LE edema or clubbing noted. Her hands are cold and fingertips are now pink.   Assessment:  #1.   Ileocolonic Crohn's disease.  She is maintained on oral mesalamine and appears to be doing well.  Last surgery was in 1995.  She is due for surveillance colonoscopy.  #2.  GERD.  She is having intermittent breakthrough symptoms for which she can use Pepcid OTC on a as needed basis.  #3.  Raynaud's phenomenon.  She does not have diagnosis of connective tissue disorder.  She will monitor her symptoms and discussed with Dr. Gar Ponto on her next visit.  Plan:  She will have CRP at the time of blood work by Dr. Gar Ponto. Continue pantoprazole and Pentasa at current dose. Pepcid OTC 20 mg p.o. nightly as needed. Surveillance colonoscopy in July 2019. Office visit in 6 months.

## 2018-04-16 ENCOUNTER — Encounter (INDEPENDENT_AMBULATORY_CARE_PROVIDER_SITE_OTHER): Payer: Self-pay | Admitting: Internal Medicine

## 2018-04-16 ENCOUNTER — Ambulatory Visit (INDEPENDENT_AMBULATORY_CARE_PROVIDER_SITE_OTHER): Payer: BC Managed Care – PPO | Admitting: Internal Medicine

## 2018-04-16 VITALS — BP 124/76 | HR 66 | Ht 66.5 in | Wt 165.6 lb

## 2018-04-16 DIAGNOSIS — K5080 Crohn's disease of both small and large intestine without complications: Secondary | ICD-10-CM | POA: Diagnosis not present

## 2018-04-16 DIAGNOSIS — K219 Gastro-esophageal reflux disease without esophagitis: Secondary | ICD-10-CM | POA: Diagnosis not present

## 2018-04-16 MED ORDER — RABEPRAZOLE SODIUM 20 MG PO TBEC: 20.0000 mg | DELAYED_RELEASE_TABLET | Freq: Every day | ORAL | 5 refills | Status: DC

## 2018-04-16 NOTE — Progress Notes (Signed)
Presenting complaint;  Follow-up for Crohn's disease and GERD.  Database and subjective:  Patient is 72 year old Caucasian female who has several year history of Crohn's disease with 2 prior surgeries last one of which was in 1995 with right hemicolectomy who is here for scheduled visit.  She was last seen 6 months ago. She states the pantoprazole is not working anymore.  She has frequent heartburn usually at night.  Some mornings she has been nauseated like today.  She has not had any vomiting.  She denies dysphagia.  She states last month she had pain in left lower quadrant of abdomen.  It started possibly on 8 August and was gone by first week of September.  She had some constipation during this time but pain was not relieved with bowel movements.  No history of fever chills melena or rectal bleeding or hematuria.  Pain was dull and never intense.  No history of back pain.  She has not experience any pain in right lower quadrant.  She has oral aphthous ulcers occasionally but not lately.  She is still working and hopes to retire in June next year. She is due for surveillance colonoscopy this year but she would like to wait until June next year.   She had blood work by Dr. Quillian Quince 2 months ago and brought a copy for review.   Current Medications: Outpatient Encounter Medications as of 04/16/2018  Medication Sig  . Cholecalciferol (VITAMIN D3) 2000 UNITS TABS Take 2,000 Units by mouth daily.   . folic acid (FOLVITE) 597 MCG tablet Take 400 mcg by mouth daily.    . Multiple Vitamins-Calcium (VIACTIV MULTI-VITAMIN) CHEW Chew 3 tablets by mouth daily.   . Multiple Vitamins-Minerals (MULTIVITAMIN & MINERAL PO) Take 1 tablet by mouth 2 (two) times daily.   . pantoprazole (PROTONIX) 40 MG tablet TAKE 1 TABLET BY MOUTH DAILY BEFORE BREAKFAST.  Marland Kitchen PENTASA 500 MG CR capsule TAKE 2 CAPSULES BY MOUTH FOUR TIMES DAILY  . Polyethyl Glycol-Propyl Glycol (SYSTANE OP) Apply to eye. 1-2 DROPS AS NEEDED TO BOTH  EYES.  . HYDROcodone-acetaminophen (NORCO/VICODIN) 5-325 MG tablet Take 1 tablet by mouth every 6 (six) hours as needed for moderate pain. (Patient not taking: Reported on 04/16/2018)   No facility-administered encounter medications on file as of 04/16/2018.      Objective: Blood pressure 124/76, pulse 66, height 5' 6.5" (1.689 m), weight 165 lb 9.6 oz (75.1 kg). Patient is alert and in no acute distress. Conjunctiva is pink. Sclera is nonicteric Oropharyngeal mucosa is normal. No neck masses or thyromegaly noted. Cardiac exam with regular rhythm normal S1 and S2. No murmur or gallop noted. Lungs are clear to auscultation. Abdomen is symmetrical.  She has midline scar as well as horizontal scar in right lower quadrant of her abdomen.  On palpation abdomen is soft.  No tenderness or fullness noted in left lower quadrant.  She has mild fullness in right mid abdomen above the level of scar with minimal tenderness.  No hepatosplenomegaly. No LE edema or clubbing noted.  Labs/studies Results:  Lab data from 02/12/2018  WBC 5.5, H&H 13.5 and 40.6 and platelet count 290K. Glucose 88 BUN 12, creatinine 0.71 Serum sodium 142, serum potassium 4.5, chloride 104, CO2 24. Serum calcium 9.6. Bilirubin 0.3, AP 89, AST 16, ALT 12, total protein 6.9 and albumin 4.3. CRP 3 which is within normal limits. TSH 0.719.   Assessment:  #1.  Chronic GERD.  Symptoms are not well controlled with dietary measures and  pantoprazole.  She has been on rabeprazole in the past and it has worked the best.  She does not have any alarm symptoms.  #2.  History of ileocolonic Crohn's disease.  Clinically she appears to be in remission.  Recent CRP was normal.  She had left-sided abdominal pain last month which has resolved.  It is unclear if pain was due to constipation or referred pain from her back.  If pain recurs will reevaluate.  #3.  History of colonic adenoma.   Plan:  Medication list updated.  Pain  medication relieves it as she has not taken it over 2 years. Discontinue pantoprazole. Begin rebaprazole 20 mg p.o. every morning. Continue Pentasa at current dose of 1 g p.o. 4 times daily. Patient will call with progress report in 4 weeks or earlier if read omeprazole not covered. Surveillance colonoscopy in June 2020. Office visit in 6 months.

## 2018-04-16 NOTE — Patient Instructions (Addendum)
Notify if Rebaprazole does not control GERD symptoms.

## 2018-05-10 ENCOUNTER — Other Ambulatory Visit (INDEPENDENT_AMBULATORY_CARE_PROVIDER_SITE_OTHER): Payer: Self-pay | Admitting: Internal Medicine

## 2018-06-10 ENCOUNTER — Ambulatory Visit (INDEPENDENT_AMBULATORY_CARE_PROVIDER_SITE_OTHER): Payer: BC Managed Care – PPO | Admitting: Internal Medicine

## 2018-06-10 ENCOUNTER — Encounter (INDEPENDENT_AMBULATORY_CARE_PROVIDER_SITE_OTHER): Payer: Self-pay | Admitting: Internal Medicine

## 2018-06-10 VITALS — BP 150/80 | HR 72 | Temp 98.6°F | Ht 66.5 in | Wt 156.9 lb

## 2018-06-10 DIAGNOSIS — K219 Gastro-esophageal reflux disease without esophagitis: Secondary | ICD-10-CM | POA: Diagnosis not present

## 2018-06-10 NOTE — Progress Notes (Signed)
Subjective:    Patient ID: Gail Henry, female    DOB: 1945-09-06, 72 y.o.   MRN: [id number]  HPI She last saw Dr. Laural Golden in September of this year. She c/o GERD. The Protonix wa not working.  Frequent Heart burn usually at night.  Some morning she had nausea.  Today she states she is still having heart burn. Yesterday she took, Tums, Pepcid, and Aciphex which did help. She has tried Protonix which really did not help. She is watching what she eats. She is avoiding spicy foods. She says she had cherrios and peaches and had GERD before she got to school.  Her appetite is not good. She has lost about 9 pounds since her visit in September. She has some nausea off and on . Symptoms x 4-5 weeks.    Hx of Crohn's disease with 2 prior surgeries. Last one in 1995 with rt hemicolectomy .  She is due for a colonoscopy but want to wait til June of next year. Maintained on Pentasa    03/15/2012: Colonoscopy  Indications: Patient is 72 year old Caucasian female was undergoing average risk screening colonoscopy. She has small bowel Crohn disease and has undergone 2 surgeries in the past most recently in 1995.  Impression:  Mucosal nodularity with ulceration at ileocecal valve along with luminal narrowing. Biopsy taken from this area. TI not examined. Small cecal polyp ablated via cold biopsy. Focal ulcerated nodular mucosa at sigmoid colon ostomy hyperplastic polyp. Biopsy taken. Scattered sigmoid colon diverticula. External hemorrhoids and 3 anal papillae.  Biopsy results reviewed with patient. Colonic polyp is tubular adenoma. Biopsy from anastomosis shows active disease. Op see from sigmoid colon shows active disease.  Review of Systems Past Medical History:  Diagnosis Date  . Abdominal pain 10/18/2010  . Constipation 10/18/2010  . Crohn's disease ([place]) 10/18/2010   SMALL BOWELS  . Diarrhea 10/18/2010    Past Surgical History:  Procedure Laterality Date  . COLONOSCOPY   10/16/2001  . COLONOSCOPY  03/15/2012   Procedure: COLONOSCOPY;  Surgeon: Rogene Houston, MD;  Location: [facility];  Service: Endoscopy;  Laterality: N/A;  730  . LAPAROSCOPIC CHOLECYSTECTOMY  12/17/96   DEMASON    Allergies  Allergen Reactions  . Penicillins Hives    Current Outpatient Medications on File Prior to Visit  Medication Sig Dispense Refill  . Cholecalciferol (VITAMIN D3) 2000 UNITS TABS Take 2,000 Units by mouth daily.     . folic acid (FOLVITE) 834 MCG tablet Take 400 mcg by mouth daily.      . Multiple Vitamins-Calcium (VIACTIV MULTI-VITAMIN) CHEW Chew 3 tablets by mouth daily.     . Multiple Vitamins-Minerals (MULTIVITAMIN & MINERAL PO) Take 1 tablet by mouth 2 (two) times daily.     Marland Kitchen PENTASA 500 MG CR capsule TAKE 2 CAPSULES BY MOUTH FOUR TIMES DAILY 720 capsule 3  . Polyethyl Glycol-Propyl Glycol (SYSTANE OP) Apply to eye. 1-2 DROPS AS NEEDED TO BOTH EYES.    . RABEprazole (ACIPHEX) 20 MG tablet Take 1 tablet (20 mg total) by mouth daily. 30 tablet 5   No current facility-administered medications on file prior to visit.         Objective:   Physical Exam Blood pressure (!) 150/80, pulse 72, temperature 98.6 F (37 C), height 5' 6.5" (1.689 m), weight 156 lb 14.4 oz (71.2 kg). Alert and oriented. Skin warm and dry. Oral mucosa is moist.   . Sclera anicteric, conjunctivae is pink. Thyroid not enlarged. No cervical  lymphadenopathy. Lungs clear. Heart regular rate and rhythm.  Abdomen is soft. Bowel sounds are positive. No hepatomegaly. No abdominal masses felt. No tenderness.  No edema to lower extremities.          Assessment & Plan:  GERD. Am going to give her samples of Dexilant and see how she does. PR in 2 weeks.

## 2018-06-10 NOTE — Patient Instructions (Signed)
Samples of Dexilant given to patient. PR in 2 weeks.

## 2018-06-13 ENCOUNTER — Telehealth (INDEPENDENT_AMBULATORY_CARE_PROVIDER_SITE_OTHER): Payer: Self-pay | Admitting: *Deleted

## 2018-06-13 NOTE — Telephone Encounter (Signed)
Patient called and left a message , she was seen in the office by Terri on Tuesday of this week. She was given a new PPI. On Wednesday and and today , Thursday she states that she cannot hardly move with her back.  She feels that it is the medication. C/O the same symptoms. Work Number is [phone] ext :[phone] ,  Cell [phone].

## 2018-06-13 NOTE — Telephone Encounter (Signed)
No answer. Will try again later.

## 2018-10-13 ENCOUNTER — Other Ambulatory Visit (INDEPENDENT_AMBULATORY_CARE_PROVIDER_SITE_OTHER): Payer: Self-pay | Admitting: Internal Medicine

## 2018-10-15 ENCOUNTER — Ambulatory Visit (INDEPENDENT_AMBULATORY_CARE_PROVIDER_SITE_OTHER): Payer: BC Managed Care – PPO | Admitting: Internal Medicine

## 2018-10-15 ENCOUNTER — Other Ambulatory Visit: Payer: Self-pay

## 2018-10-15 ENCOUNTER — Encounter (INDEPENDENT_AMBULATORY_CARE_PROVIDER_SITE_OTHER): Payer: Self-pay | Admitting: Internal Medicine

## 2018-10-15 VITALS — BP 116/74 | HR 73 | Temp 98.6°F | Resp 18 | Ht 66.5 in | Wt 156.4 lb

## 2018-10-15 DIAGNOSIS — K5080 Crohn's disease of both small and large intestine without complications: Secondary | ICD-10-CM | POA: Diagnosis not present

## 2018-10-15 DIAGNOSIS — K219 Gastro-esophageal reflux disease without esophagitis: Secondary | ICD-10-CM | POA: Diagnosis not present

## 2018-10-15 MED ORDER — RABEPRAZOLE SODIUM 20 MG PO TBEC: 20.0000 mg | DELAYED_RELEASE_TABLET | Freq: Every day | ORAL | 3 refills | Status: DC

## 2018-10-15 NOTE — Patient Instructions (Signed)
Colonoscopy to be scheduled in July 2020.

## 2018-10-15 NOTE — Progress Notes (Signed)
Presenting complaint;  Follow-up for Crohn's disease and GERD.  Subjective:  Patient is 73 year old Caucasian female with history of small and large bowel Crohn's disease was 2 remote surgeries as well as chronic GERD who is here for scheduled visit.  She was last seen 6 months ago. She says she is doing very well.  She is planning to retire on Dec 06, 2018.  She says heartburn is well controlled with rabeprazole.  When she was switched from brand name to generic medication she has some issues but she feels she is gotten used to it.  She has good appetite.  Only time she has abdominal pain is when she eats fiber rich foods.  She generally has 1 bowel movement per day.  She may skip a day here and there.  She denies melena or rectal bleeding.  She has lost 9 pounds since her last visit.  She walks on her treadmill at least once a week.  Her goal is to do it more often.  Current Medications: Outpatient Encounter Medications as of 10/15/2018  Medication Sig  . Carboxymethylcellul-Glycerin (REFRESH RELIEVA OP) Apply 1-2 drops to eye 3 (three) times daily.  . Cholecalciferol (VITAMIN D3) 2000 UNITS TABS Take 2,000 Units by mouth daily.   . folic acid (FOLVITE) 542 MCG tablet Take 400 mcg by mouth daily.    . Multiple Vitamins-Calcium (VIACTIV MULTI-VITAMIN) CHEW Chew 3 tablets by mouth daily.   . Multiple Vitamins-Minerals (MULTIVITAMIN & MINERAL PO) Take 1 tablet by mouth 2 (two) times daily.   Marland Kitchen PENTASA 500 MG CR capsule TAKE 2 CAPSULES BY MOUTH FOUR TIMES DAILY  . RABEprazole (ACIPHEX) 20 MG tablet TAKE 1 TABLET BY MOUTH DAILY  . [DISCONTINUED] Polyethyl Glycol-Propyl Glycol (SYSTANE OP) Apply to eye. 1-2 DROPS AS NEEDED TO BOTH EYES.   No facility-administered encounter medications on file as of 10/15/2018.      Objective: Blood pressure 116/74, pulse 73, temperature 98.6 F (37 C), temperature source Oral, resp. rate 18, height 5' 6.5" (1.689 m), weight 156 lb 6.4 oz (70.9 kg). Patient is  alert and in no acute distress. Conjunctiva is pink. Sclera is nonicteric Oropharyngeal mucosa is normal. No neck masses or thyromegaly noted. Cardiac exam with regular rhythm normal S1 and S2. No murmur or gallop noted. Lungs are clear to auscultation. Abdomen is symmetrical with midline and right lower quadrant abdominal scars.  On palpation abdomen is soft and nontender with organomegaly or masses. No LE edema or clubbing noted.   Assessment:  #1.  Chronic GERD.  She is doing well with therapy.  May consider changing her PPI to every other day once she is retired.  New prescription will be issued today.  #2.  History of small and large bowel Crohn's disease.  She also has history of cecal tubular adenoma.  Last colonoscopy was in 2015.  She is long overdue for repeat exam.  Plan:  New prescription given for rabeprazole 20 mg p.o. every morning 90 with 3 refills. Patient will continue Pentasa at current dose of 1 g p.o. 4 times daily. She will send [place] a copy of her next blood work to be done in 2 to 3 months. We will plan for surveillance colonoscopy in July 2020. Office visit in 6 months.

## 2018-11-20 ENCOUNTER — Telehealth (INDEPENDENT_AMBULATORY_CARE_PROVIDER_SITE_OTHER): Payer: Self-pay | Admitting: Internal Medicine

## 2018-11-20 ENCOUNTER — Telehealth (INDEPENDENT_AMBULATORY_CARE_PROVIDER_SITE_OTHER): Payer: Self-pay | Admitting: *Deleted

## 2018-11-20 DIAGNOSIS — K121 Other forms of stomatitis: Secondary | ICD-10-CM

## 2018-11-20 MED ORDER — NYSTATIN 100000 UNIT/ML MT SUSP: 5.0000 mL | Freq: Four times a day (QID) | OROMUCOSAL | 0 refills | Status: DC

## 2018-11-20 NOTE — Telephone Encounter (Signed)
Nystatin sent to her pharmacy

## 2018-11-20 NOTE — Telephone Encounter (Signed)
Rx for Nystatin sent to her pharmacy

## 2018-11-20 NOTE — Telephone Encounter (Signed)
Patient called and states that she has ulcers in her mouth and a sore on her tongue. She is asking if there is a medication that Dr.Rehman or Terri could call in for this.

## 2019-04-15 ENCOUNTER — Other Ambulatory Visit (INDEPENDENT_AMBULATORY_CARE_PROVIDER_SITE_OTHER): Payer: Self-pay | Admitting: *Deleted

## 2019-04-15 ENCOUNTER — Telehealth (INDEPENDENT_AMBULATORY_CARE_PROVIDER_SITE_OTHER): Payer: Self-pay | Admitting: *Deleted

## 2019-04-15 DIAGNOSIS — K121 Other forms of stomatitis: Secondary | ICD-10-CM

## 2019-04-15 DIAGNOSIS — K5080 Crohn's disease of both small and large intestine without complications: Secondary | ICD-10-CM

## 2019-04-15 NOTE — Telephone Encounter (Signed)
Patient called the office this morning . She states that she has ulcers in her mouth. On the sides, her tongue. Lips are swollen from the inside out.  She has tried several rinses, and Bendaryl liquid.  Patient has OV with [place] 04/22/19, per Dr.Rehman the patient is to try Prednisone 10 mg - she will take 2 by mouth daily until her OV next week. #60 no refills. This was called to [facility].

## 2019-04-18 LAB — COMPREHENSIVE METABOLIC PANEL
AG Ratio: 1.6 (calc) (ref 1.0–2.5)
ALT: 9 U/L (ref 6–29)
AST: 15 U/L (ref 10–35)
Albumin: 4.1 g/dL (ref 3.6–5.1)
Alkaline phosphatase (APISO): 77 U/L (ref 37–153)
BUN: 16 mg/dL (ref 7–25)
CO2: 31 mmol/L (ref 20–32)
Calcium: 9.7 mg/dL (ref 8.6–10.4)
Chloride: 104 mmol/L (ref 98–110)
Creat: 0.79 mg/dL (ref 0.60–0.93)
Globulin: 2.5 g/dL (calc) (ref 1.9–3.7)
Glucose, Bld: 87 mg/dL (ref 65–99)
Potassium: 4 mmol/L (ref 3.5–5.3)
Sodium: 141 mmol/L (ref 135–146)
Total Bilirubin: 0.3 mg/dL (ref 0.2–1.2)
Total Protein: 6.6 g/dL (ref 6.1–8.1)

## 2019-04-18 LAB — CBC WITH DIFFERENTIAL/PLATELET
Absolute Monocytes: 563 cells/uL (ref 200–950)
Basophils Absolute: 7 cells/uL (ref 0–200)
Basophils Relative: 0.1 %
Eosinophils Absolute: 67 cells/uL (ref 15–500)
Eosinophils Relative: 1 %
HCT: 40.5 % (ref 35.0–45.0)
Hemoglobin: 13.5 g/dL (ref 11.7–15.5)
Lymphs Abs: 2084 cells/uL (ref 850–3900)
MCH: 30 pg (ref 27.0–33.0)
MCHC: 33.3 g/dL (ref 32.0–36.0)
MCV: 90 fL (ref 80.0–100.0)
MPV: 10.6 fL (ref 7.5–12.5)
Monocytes Relative: 8.4 %
Neutro Abs: 3980 cells/uL (ref 1500–7800)
Neutrophils Relative %: 59.4 %
Platelets: 284 10*3/uL (ref 140–400)
RBC: 4.5 10*6/uL (ref 3.80–5.10)
RDW: 13.4 % (ref 11.0–15.0)
Total Lymphocyte: 31.1 %
WBC: 6.7 10*3/uL (ref 3.8–10.8)

## 2019-04-18 LAB — C-REACTIVE PROTEIN: CRP: 3.7 mg/L (ref <8.0)

## 2019-04-22 ENCOUNTER — Ambulatory Visit (INDEPENDENT_AMBULATORY_CARE_PROVIDER_SITE_OTHER): Payer: Medicare Other | Admitting: Internal Medicine

## 2019-04-22 ENCOUNTER — Encounter (INDEPENDENT_AMBULATORY_CARE_PROVIDER_SITE_OTHER): Payer: Self-pay | Admitting: Internal Medicine

## 2019-04-22 ENCOUNTER — Other Ambulatory Visit: Payer: Self-pay

## 2019-04-22 VITALS — BP 108/64 | HR 67 | Temp 98.4°F | Ht 66.5 in | Wt 159.8 lb

## 2019-04-22 DIAGNOSIS — K5000 Crohn's disease of small intestine without complications: Secondary | ICD-10-CM

## 2019-04-22 DIAGNOSIS — K120 Recurrent oral aphthae: Secondary | ICD-10-CM | POA: Insufficient documentation

## 2019-04-22 DIAGNOSIS — K219 Gastro-esophageal reflux disease without esophagitis: Secondary | ICD-10-CM

## 2019-04-22 MED ORDER — MAGIC MOUTHWASH: 5.0000 mL | Freq: Four times a day (QID) | ORAL | 1 refills | Status: DC | PRN

## 2019-04-22 NOTE — Progress Notes (Signed)
Presenting complaint;  Follow-up for small and large bowel Crohn's disease, GERD. Recent bout with aphthous ulcers.  Database and subjective:  Patient is 73 year old Caucasian female who has over 40-year history of Crohn's disease.  She has undergone 2 surgeries in the past for surgery was about 45 years ago and second surgery was 25 years ago when she had small bowel perforation.  Last colonoscopy was in in August 2013 with mild disease in the region of ileocecal valve with luminal narrowing she also had local nodularity with ulceration at sigmoid colon and biopsy was consistent with Crohn's disease.  She had small cecal adenoma and scattered diverticula at sigmoid colon.  She has been maintained on oral mesalamine.  She has required budesonide in the past.  Surveillance colonoscopy was recommended last year and she wanted to wait until this year and has been delayed again because of ongoing COVID-19 pandemic.  Patient called office last week stating that she had multiple aphthous ulcers.  She previously had an episode few months ago and prescribed Magic mouthwash by Ms. Deberah Castle, NP.  It helped but these ulcers did not go away completely.  She had flareup again and call last week and she was begun on prednisone.  Patient reports significant improvement in aphthous ulcers.  She still has some on the inner aspect of lower lip.  She dry mouth.  She wonders if is due to PPI which she stopped about 2 weeks ago and she has been using Pepcid OTC which helps.  She denies nausea vomiting or abdominal pain.  Her bowels usually move every other day.  She denies melena or rectal bleeding. She had blood work as planned prior to this visit which is reviewed under lab data.   Current Medications: Outpatient Encounter Medications as of 04/22/2019  Medication Sig  . Carboxymethylcellul-Glycerin (REFRESH RELIEVA OP) Apply 1-2 drops to eye 3 (three) times daily.  . Cholecalciferol (VITAMIN D3) 2000 UNITS TABS  Take 2,000 Units by mouth daily.   . famotidine (PEPCID) 20 MG tablet Take 20 mg by mouth daily.  . folic acid (FOLVITE) 952 MCG tablet Take 400 mcg by mouth daily.    . Multiple Vitamins-Calcium (VIACTIV MULTI-VITAMIN) CHEW Chew 3 tablets by mouth daily.   . Multiple Vitamins-Minerals (MULTIVITAMIN & MINERAL PO) Take 1 tablet by mouth 2 (two) times daily.   Marland Kitchen nystatin-triamcinolone ointment (MYCOLOG) APPLY TO THE AFFECTED AREA(S) TWICE DAILY  . PENTASA 500 MG CR capsule TAKE 2 CAPSULES BY MOUTH FOUR TIMES DAILY  . predniSONE (DELTASONE) 10 MG tablet Take 10 mg by mouth daily with breakfast. Patient is currently taking 2 (20 mg) daily.  Marland Kitchen tretinoin (RETIN-A) 0.025 % cream APPLY TO THE AFFECTED AREA(S) ON THE FACE AT BEDTIME AS NEEDED  . RABEprazole (ACIPHEX) 20 MG tablet Take 1 tablet (20 mg total) by mouth daily. (Patient not taking: Reported on 04/22/2019)  . [DISCONTINUED] nystatin (MYCOSTATIN) 100000 UNIT/ML suspension Take 5 mLs (500,000 Units total) by mouth 4 (four) times daily. (Patient not taking: Reported on 04/22/2019)   No facility-administered encounter medications on file as of 04/22/2019.      Objective: Blood pressure 108/64, pulse 67, temperature 98.4 F (36.9 C), temperature source Oral, height 5' 6.5" (1.689 m), weight 159 lb 12.8 oz (72.5 kg). Patient is alert and in no acute distress. Conjunctiva is pink. Sclera is nonicteric She has small aphthous ulcer on inner aspect of lower lip. No neck masses or thyromegaly noted. Cardiac exam with regular rhythm normal S1  and S2.  Faint systolic murmur noted at aortic area best heard when she is sitting upright. Lungs are clear to auscultation. Abdomen is full with lower midline and horizontal scar in right lower quadrant of abdomen.  Bowel sounds are normal.  On palpation abdomen is soft.  She has mild tenderness in right mid abdomen and deep palpation but no mass palpated.  No organomegaly or masses. Trace edema noted around  ankles.  Labs/studies Results:  CBC Latest Ref Rng & Units 04/17/2019 11/18/2015 11/03/2014  WBC 3.8 - 10.8 Thousand/uL 6.7 7.1 6.6  Hemoglobin 11.7 - 15.5 g/dL 13.5 13.4 14.2  Hematocrit 35.0 - 45.0 % 40.5 40.3 41.7  Platelets 140 - 400 Thousand/uL 284 307 333    CMP Latest Ref Rng & Units 04/17/2019 11/18/2015 02/17/2013  Glucose 65 - 99 mg/dL 87 - -  BUN 7 - 25 mg/dL 16 - -  Creatinine 0.60 - 0.93 mg/dL 0.79 0.70 -  Sodium 135 - 146 mmol/L 141 - -  Potassium 3.5 - 5.3 mmol/L 4.0 - -  Chloride 98 - 110 mmol/L 104 - -  CO2 20 - 32 mmol/L 31 - -  Calcium 8.6 - 10.4 mg/dL 9.7 - -  Total Protein 6.1 - 8.1 g/dL 6.6 - 7.3  Total Bilirubin 0.2 - 1.2 mg/dL 0.3 - 0.4  Alkaline Phos 39 - 117 U/L - - 88  AST 10 - 35 U/L 15 - 20  ALT 6 - 29 U/L 9 - 16    Hepatic Function Latest Ref Rng & Units 04/17/2019 02/17/2013 02/22/2012  Total Protein 6.1 - 8.1 g/dL 6.6 7.3 6.9  Albumin 3.5 - 5.2 g/dL - 4.1 3.9  AST 10 - 35 U/L 15 20 17   ALT 6 - 29 U/L 9 16 16   Alk Phosphatase 39 - 117 U/L - 88 96  Total Bilirubin 0.2 - 1.2 mg/dL 0.3 0.4 0.4  Bilirubin, Direct 0.0 - 0.3 mg/dL - 0.1 -    Lab Results  Component Value Date   CRP 3.7 04/17/2019      Assessment:  #1.  Crohn's disease involving small and large bowel.  Clinically she remains in remission.  CRP is normal.  She is not anemic and not having diarrhea.  She is due for surveillance colonoscopy which is on hold because of ongoing COVID-19 pandemic.  For now she will continue oral mesalamine/Pentasa at current dose.  #2.  Oral aphthous ulcers.  She is responding to prednisone.  These ulcers may be part and parcel of inflammatory bowel disease or a separate issue.  If she has a relapse we will consider topical therapy with steroids.  #3.  Chronic GERD.  She was on AcipHex but now she is on famotidine which she should continue as long as it is working.  #4.  History of small cecal adenoma.  She is due for surveillance colonoscopy.   Plan:  Drop  prednisone dose by 5 mg every 3 to 5 days. Call if aphthous ulcers relapse. Continue Pentasa at a dose of 1 g by mouth 4 times a day. Continue famotidine 20 mg p.o. twice daily. Office visit in 6 months. We will schedule colonoscopy possibly after next office visit.

## 2019-04-22 NOTE — Patient Instructions (Signed)
Drop prednisone dose by 5 mg every 3 to 5 days. Please call office if aphthous ulcers relapse.

## 2019-05-17 ENCOUNTER — Other Ambulatory Visit (INDEPENDENT_AMBULATORY_CARE_PROVIDER_SITE_OTHER): Payer: Self-pay | Admitting: Internal Medicine

## 2019-10-21 ENCOUNTER — Encounter (INDEPENDENT_AMBULATORY_CARE_PROVIDER_SITE_OTHER): Payer: Self-pay | Admitting: Internal Medicine

## 2019-10-21 ENCOUNTER — Other Ambulatory Visit: Payer: Self-pay

## 2019-10-21 ENCOUNTER — Ambulatory Visit (INDEPENDENT_AMBULATORY_CARE_PROVIDER_SITE_OTHER): Payer: Medicare Other | Admitting: Internal Medicine

## 2019-10-21 VITALS — BP 123/79 | HR 88 | Temp 97.9°F | Ht 66.5 in | Wt 166.0 lb

## 2019-10-21 DIAGNOSIS — Z860100 Personal history of colon polyps, unspecified: Secondary | ICD-10-CM | POA: Insufficient documentation

## 2019-10-21 DIAGNOSIS — K219 Gastro-esophageal reflux disease without esophagitis: Secondary | ICD-10-CM | POA: Diagnosis not present

## 2019-10-21 DIAGNOSIS — K5000 Crohn's disease of small intestine without complications: Secondary | ICD-10-CM | POA: Diagnosis not present

## 2019-10-21 NOTE — Patient Instructions (Signed)
Surveillance colonoscopy to be scheduled after next office visit.

## 2019-10-21 NOTE — Progress Notes (Signed)
Presenting complaint;  Follow-up for Crohn's disease.  Database and subjective:  Patient is 74 year old Caucasian female with history of history of ileocolonic Crohn's disease of for 40 years who is here for scheduled visit.  She was last seen in September 2020.  She remains on Pentasa.  She also has history of GERD and used to be on PPI but presently she is using famotidine on as-needed basis. She brings along copy of her recent blood work and she also has a copy of her vaccination records. She received Pneumovax 23 in July 2019.  She received Shingrix first dose in June 2020 and second dose in June 2020. She had bone density in June 2019. She feels well.  She generally has 1 formed stool daily.  She denies abdominal pain melena or rectal bleeding.  She has gained 7 pounds since her last visit.  She is states she walks for 1 hour every day.  She is still working part-time.  She generally works 20 hours a week.  She is taking famotidine no more than 2-3 times in a month.  She is watching her diet.  He is at times have heartburn when she takes fourth dose of Pentasa.  She denies dysphagia nausea or vomiting. She complains of dry dry eyes.  She has not been diagnosed with any condition that is associated with Crohn's disease.  Current Medications: Outpatient Encounter Medications as of 10/21/2019  Medication Sig  . Carboxymethylcellul-Glycerin (REFRESH RELIEVA OP) Apply 1-2 drops to eye 3 (three) times daily.  . Cholecalciferol (VITAMIN D3) 2000 UNITS TABS Take 2,000 Units by mouth daily.   . Famotidine (PEPCID AC PO) Take by mouth as needed.  . folic acid (FOLVITE) 280 MCG tablet Take 400 mcg by mouth daily.    . magic mouthwash SOLN Take 5 mLs by mouth 4 (four) times daily as needed for mouth pain.  . Multiple Vitamins-Calcium (VIACTIV MULTI-VITAMIN) CHEW Chew 3 tablets by mouth daily.   . Multiple Vitamins-Minerals (MULTIVITAMIN & MINERAL PO) Take 1 tablet by mouth 2 (two) times daily.   Marland Kitchen  nystatin-triamcinolone ointment (MYCOLOG) APPLY TO THE AFFECTED AREA(S) TWICE DAILY  . PENTASA 500 MG CR capsule TAKE TWO CAPSULES BY MOUTH FOUR TIMES DAILY  . tretinoin (RETIN-A) 0.025 % cream APPLY TO THE AFFECTED AREA(S) ON THE FACE AT BEDTIME AS NEEDED  . [DISCONTINUED] famotidine (PEPCID) 20 MG tablet Take 20 mg by mouth daily.  . [DISCONTINUED] predniSONE (DELTASONE) 10 MG tablet Take 10 mg by mouth daily with breakfast. Patient is currently taking 2 (20 mg) daily.   No facility-administered encounter medications on file as of 10/21/2019.     Objective: Blood pressure 123/79, pulse 88, temperature 97.9 F (36.6 C), temperature source Temporal, height 5' 6.5" (1.689 m), weight 166 lb (75.3 kg). Patient is alert and in no acute distress. She is wearing a facial mask. Conjunctiva is pink. Sclera is nonicteric Oropharyngeal mucosa is normal. No neck masses or thyromegaly noted. Cardiac exam with regular rhythm normal S1 and S2. No murmur or gallop noted. Lungs are clear to auscultation. Abdomen is full.  She has midline scar as well as horizontal scar in right mid/lower abdomen crossing over to the left side.  Bowel sounds are normal.  She has mild tenderness below the horizontal scar and to the right of vertical scar.  There is no guarding or rebound.  No organomegaly or masses. No LE edema or clubbing noted.  Labs/studies Results:  CBC Latest Ref Rng & Units 04/17/2019  11/18/2015 11/03/2014  WBC 3.8 - 10.8 Thousand/uL 6.7 7.1 6.6  Hemoglobin 11.7 - 15.5 g/dL 13.5 13.4 14.2  Hematocrit 35.0 - 45.0 % 40.5 40.3 41.7  Platelets 140 - 400 Thousand/uL 284 307 333    CMP Latest Ref Rng & Units 04/17/2019 11/18/2015 02/17/2013  Glucose 65 - 99 mg/dL 87 - -  BUN 7 - 25 mg/dL 16 - -  Creatinine 0.60 - 0.93 mg/dL 0.79 0.70 -  Sodium 135 - 146 mmol/L 141 - -  Potassium 3.5 - 5.3 mmol/L 4.0 - -  Chloride 98 - 110 mmol/L 104 - -  CO2 20 - 32 mmol/L 31 - -  Calcium 8.6 - 10.4 mg/dL 9.7 - -   Total Protein 6.1 - 8.1 g/dL 6.6 - 7.3  Total Bilirubin 0.2 - 1.2 mg/dL 0.3 - 0.4  Alkaline Phos 39 - 117 U/L - - 88  AST 10 - 35 U/L 15 - 20  ALT 6 - 29 U/L 9 - 16    Hepatic Function Latest Ref Rng & Units 04/17/2019 02/17/2013 02/22/2012  Total Protein 6.1 - 8.1 g/dL 6.6 7.3 6.9  Albumin 3.5 - 5.2 g/dL - 4.1 3.9  AST 10 - 35 U/L _0 ALT 6 - 29 U/L _1 Alk Phosphatase 39 - 117 U/L - 88 96  Total Bilirubin 0.2 - 1.2 mg/dL 0.3 0.4 0.4  Bilirubin, Direct 0.0 - 0.3 mg/dL - 0.1 -    Lab Results  Component Value Date   CRP 3.7 04/17/2019    Lab data from 08/13/2019  WBC 5.7, H&H 13.4 and 40.7 and platelet count 300K. Glucose 85, BUN 16, creatinine 0.82 Serum sodium 140, potassium 4.2, chloride 105, CO2 25 Serum calcium 9.2 Bilirubin 0.4, AP 93, AST 18, ALT 13, total protein 6.5 and albumin 4.1. TSH 0.578.  Assessment:  #1.  Ileocolonic Crohn's disease.  She has undergone 2 surgeries.  Second surgery was in 1995.  She appears to be in remission.  He is presently on oral mesalamine.   #2.  History of GERD.  She is doing quite well with dietary measures and sporadic famotidine use.  Since she is having heartburn with antacid she may consider taking the dose earlier in the evening rather than bedtime.  #3.  History of cecal tubular adenoma found on colonoscopy of August 2013.  Plan:  Patient will continue Pentasa at current dose and famotidine on as-needed basis. Patient advised to take fourth dose of Pentasa earlier in the evening. She will undergo surveillance colonoscopy later this year possibly after next visit in 6 months.

## 2020-04-27 ENCOUNTER — Other Ambulatory Visit: Payer: Self-pay

## 2020-04-27 ENCOUNTER — Encounter (INDEPENDENT_AMBULATORY_CARE_PROVIDER_SITE_OTHER): Payer: Self-pay | Admitting: Internal Medicine

## 2020-04-27 ENCOUNTER — Ambulatory Visit (INDEPENDENT_AMBULATORY_CARE_PROVIDER_SITE_OTHER): Payer: Medicare Other | Admitting: Internal Medicine

## 2020-04-27 ENCOUNTER — Other Ambulatory Visit (INDEPENDENT_AMBULATORY_CARE_PROVIDER_SITE_OTHER): Payer: Self-pay | Admitting: *Deleted

## 2020-04-27 ENCOUNTER — Telehealth (INDEPENDENT_AMBULATORY_CARE_PROVIDER_SITE_OTHER): Payer: Self-pay | Admitting: *Deleted

## 2020-04-27 ENCOUNTER — Encounter (INDEPENDENT_AMBULATORY_CARE_PROVIDER_SITE_OTHER): Payer: Self-pay | Admitting: *Deleted

## 2020-04-27 ENCOUNTER — Other Ambulatory Visit (INDEPENDENT_AMBULATORY_CARE_PROVIDER_SITE_OTHER): Payer: Self-pay

## 2020-04-27 VITALS — BP 113/75 | HR 75 | Temp 99.2°F | Ht 66.5 in | Wt 166.4 lb

## 2020-04-27 DIAGNOSIS — R1319 Other dysphagia: Secondary | ICD-10-CM | POA: Insufficient documentation

## 2020-04-27 DIAGNOSIS — R1310 Dysphagia, unspecified: Secondary | ICD-10-CM | POA: Diagnosis not present

## 2020-04-27 DIAGNOSIS — K219 Gastro-esophageal reflux disease without esophagitis: Secondary | ICD-10-CM

## 2020-04-27 DIAGNOSIS — K5080 Crohn's disease of both small and large intestine without complications: Secondary | ICD-10-CM | POA: Diagnosis not present

## 2020-04-27 DIAGNOSIS — Z86010 Personal history of colonic polyps: Secondary | ICD-10-CM

## 2020-04-27 MED ORDER — PLENVU 140 G PO SOLR: 1.0000 | Freq: Once | ORAL | 0 refills | Status: AC

## 2020-04-27 MED ORDER — RABEPRAZOLE SODIUM 20 MG PO TBEC: 20.0000 mg | DELAYED_RELEASE_TABLET | Freq: Every day | ORAL | 5 refills | Status: DC

## 2020-04-27 MED ORDER — MESALAMINE ER 500 MG PO CPCR
1000.0000 mg | ORAL_CAPSULE | Freq: Four times a day (QID) | ORAL | 11 refills | Status: DC
Start: 2020-04-27 — End: 2021-01-28

## 2020-04-27 NOTE — Progress Notes (Signed)
Presenting complaint;  Follow-up for Crohn's disease and GERD. Patient complains of solid food dysphagia and episode of lower abdominal pain last month.  Database and subjective:  Patient is 74 year old Caucasian female with over 40-year history of ileocolonic Crohn's disease, status post bowel resection on 2 occasions who has history of GERD and is here for scheduled visit.  She was last seen 6 months ago. About 3 months ago she developed an episode of chest pain while she was eating a steak.  She says food bolus eventually went down but she had chest pain for 1 day.  She has been taking famotidine since then.  She is also having intermittent heartburn.  She she has been on PPI for years but it was switched to famotidine.  She recalls she had esophageal dilation several years ago.  No record of this procedure in epic.  Her second complaint is 1 of lower abdominal pain last month.  She describes pain to be intense and she noted difficulty in walking.  She experienced nausea without vomiting.  She did not experience change in her bowel habits fever or chills hematuria or vaginal bleeding.  She decided to change her diet to full liquids and soft diet and she finally had full recovery in 1 week.  She did not contact Dr. Olena Heckle or our office when she had this pain.  She believes this pain was triggered when she had more than usual amount of cucumbers and tomatoes.  Her appetite is good.  Her weight has been stable.  She says since she has been eating yogurt with probiotic she has noted her bowels to be regular.  She generally has a bowel movement every day.  She denies melena or rectal bleeding. She has received both doses of Covid vaccine.  Last colonoscopy was in August 2013 revealing nodularity and ulceration to ileocecal valve and luminal narrowing.  TI was not examined.  She had small cecal polyp ablated by cold biopsy and was tubular adenoma.  She also had focal nodularity at sigmoid colon and  sigmoid colon diverticulosis.  Biopsy both from ileocecal valve and sigmoid colon revealed changes of Crohn's disease. She does not take OTC NSAIDs. She is still working part-time.  She works 20 hours a week and she takes classes from home.  She is teaching at [employer].  Current Medications: Outpatient Encounter Medications as of 04/27/2020  Medication Sig  . Carboxymethylcellul-Glycerin (REFRESH RELIEVA OP) Apply 1-2 drops to eye 3 (three) times daily.  . Cholecalciferol (VITAMIN D3) 2000 UNITS TABS Take 2,000 Units by mouth daily.   . Famotidine (PEPCID AC PO) Take by mouth as needed.  . folic acid (FOLVITE) 300 MCG tablet Take 400 mcg by mouth daily.    . Multiple Vitamins-Calcium (VIACTIV MULTI-VITAMIN) CHEW Chew 3 tablets by mouth daily.   . Multiple Vitamins-Minerals (MULTIVITAMIN & MINERAL PO) Take 1 tablet by mouth 2 (two) times daily.   Marland Kitchen PENTASA 500 MG CR capsule TAKE TWO CAPSULES BY MOUTH FOUR TIMES DAILY  . tretinoin (RETIN-A) 0.025 % cream APPLY TO THE AFFECTED AREA(S) ON THE FACE AT BEDTIME AS NEEDED   No facility-administered encounter medications on file as of 04/27/2020.     Objective: Blood pressure 113/75, pulse 75, temperature 99.2 F (37.3 C), temperature source Oral, height 5' 6.5" (1.689 m), weight 166 lb 6.4 oz (75.5 kg). Patient is alert and in no acute distress. Patient is wearing a mask. Conjunctiva is pink. Sclera is nonicteric Oropharyngeal mucosa is normal. No neck  masses or thyromegaly noted. Cardiac exam with regular rhythm normal S1 and S2. No murmur or gallop noted. Lungs are clear to auscultation. Abdomen is symmetrical with long midline scar as well as horizontal scar right mid abdomen.  On palpation abdomen is soft.  She has mild tenderness below the horizontal scar on the right side without guarding.  No organomegaly or masses. No LE edema or clubbing noted.  Labs/studies Results:  CBC Latest Ref Rng & Units 04/17/2019 11/18/2015 11/03/2014  WBC 3.8 -  10.8 Thousand/uL 6.7 7.1 6.6  Hemoglobin 11.7 - 15.5 g/dL 13.5 13.4 14.2  Hematocrit 35 - 45 % 40.5 40.3 41.7  Platelets 140 - 400 Thousand/uL 284 307 333    CMP Latest Ref Rng & Units 04/17/2019 11/18/2015 02/17/2013  Glucose 65 - 99 mg/dL 87 - -  BUN 7 - 25 mg/dL 16 - -  Creatinine 0.60 - 0.93 mg/dL 0.79 0.70 -  Sodium 135 - 146 mmol/L 141 - -  Potassium 3.5 - 5.3 mmol/L 4.0 - -  Chloride 98 - 110 mmol/L 104 - -  CO2 20 - 32 mmol/L 31 - -  Calcium 8.6 - 10.4 mg/dL 9.7 - -  Total Protein 6.1 - 8.1 g/dL 6.6 - 7.3  Total Bilirubin 0.2 - 1.2 mg/dL 0.3 - 0.4  Alkaline Phos 39 - 117 U/L - - 88  AST 10 - 35 U/L 15 - 20  ALT 6 - 29 U/L 9 - 16    Hepatic Function Latest Ref Rng & Units 04/17/2019 02/17/2013 02/22/2012  Total Protein 6.1 - 8.1 g/dL 6.6 7.3 6.9  Albumin 3.5 - 5.2 g/dL - 4.1 3.9  AST 10 - 35 U/L 15 20 17   ALT 6 - 29 U/L 9 16 16   Alk Phosphatase 39 - 117 U/L - 88 96  Total Bilirubin 0.2 - 1.2 mg/dL 0.3 0.4 0.4  Bilirubin, Direct 0.0 - 0.3 mg/dL - 0.1 -    Lab Results  Component Value Date   CRP 3.7 04/17/2019    Lab data from 08/13/2019  WBC 5.7, H&H 13.4 and 40.7 and platelet count 300K Glucose 85 BUN 16, creatinine 0.82 serum sodium 140, potassium 4.2, chloride 105, CO2 25 Serum calcium 9.2 Bilirubin 0.4, AP 93, AST 18, ALT 13, total protein 6.5 and albumin 4.1. TSH 0.578  Assessment:  #1.  Crohn's disease.  Her disease is primarily involved terminal ileum requiring 2 bowel resections but she also had focal sigmoid colon involvement.  He is presently on oral mesalamine and appears to be in remission.  Her last colonoscopy was over 7 years ago.  She is long overdue for surveillance colonoscopy.   #2.  Chronic GERD.  Symptoms poorly controlled with famotidine.  She is also having dysphagia and therefore may have developed esophageal stricture.  She has undergone esophageal dilation perhaps 20 years ago.  #3.  Recent bout of lower abdominal pain for which she did not  seek medical advice most likely appears to be a self-limiting episode of sigmoid diverticulitis.  Patient advised to contact office should similar pain recur.   Plan:  Use famotidine OTC 20 mg nightly as needed. Begin Rebaprazole 20 mg by mouth 30 minutes before breakfast daily. Esophagogastroduodenoscopy with esophageal dilation and surveillance colonoscopy to be scheduled. Office visit in 6 months.

## 2020-04-27 NOTE — Telephone Encounter (Signed)
Patient needs Plenvu (copay card)

## 2020-04-27 NOTE — Patient Instructions (Signed)
Esophagogastroduodenoscopy with esophageal dilation and colonoscopy to be scheduled. Take AcipHex/rabeprazole 20 mg by mouth 30 minutes before breakfast daily and take Pepcid/famotidine at bedtime on as-needed basis.

## 2020-05-28 NOTE — Patient Instructions (Signed)
[facility]  05/28/2020     @PREFPERIOPPHARMACY @   Your procedure is scheduled on  06/02/2020.  Report to [facility] at  [phone]  A.M.  Call this number if you have problems the morning of surgery:  [phone]   Remember:  Follow the diet and prep instructions given to you by the office.                        Take these medicines the morning of surgery with A SIP OF WATER  aciphex.    Do not wear jewelry, make-up or nail polish.  Do not wear lotions, powders, or perfumes. Please wear deodorant and brush your teeth.  Do not shave 48 hours prior to surgery.  Men may shave face and neck.  Do not bring valuables to the hospital.  [facility] is not responsible for any belongings or valuables.  Contacts, dentures or bridgework may not be worn into surgery.  Leave your suitcase in the car.  After surgery it may be brought to your room.  For patients admitted to the hospital, discharge time will be determined by your treatment team.  Patients discharged the day of surgery will not be allowed to drive home.   Name and phone number of your driver:   family Special instructions:  DO NOT smoke the morning of your procedure.  Please read over the following fact sheets that you were given. Anesthesia Post-op Instructions and Care and Recovery After Surgery       Upper Endoscopy, Adult, Care After This sheet gives you information about how to care for yourself after your procedure. Your health care provider may also give you more specific instructions. If you have problems or questions, contact your health care provider. What can I expect after the procedure? After the procedure, it is common to have:  A sore throat.  Mild stomach pain or discomfort.  Bloating.  Nausea. Follow these instructions at home:   Follow instructions from your health care provider about what to eat or drink after your procedure.  Return to your normal activities as told by  your health care provider. Ask your health care provider what activities are safe for you.  Take over-the-counter and prescription medicines only as told by your health care provider.  Do not drive for 24 hours if you were given a sedative during your procedure.  Keep all follow-up visits as told by your health care provider. This is important. Contact a health care provider if you have:  A sore throat that lasts longer than one day.  Trouble swallowing. Get help right away if:  You vomit blood or your vomit looks like coffee grounds.  You have: ? A fever. ? Bloody, black, or tarry stools. ? A severe sore throat or you cannot swallow. ? Difficulty breathing. ? Severe pain in your chest or abdomen. Summary  After the procedure, it is common to have a sore throat, mild stomach discomfort, bloating, and nausea.  Do not drive for 24 hours if you were given a sedative during the procedure.  Follow instructions from your health care provider about what to eat or drink after your procedure.  Return to your normal activities as told by your health care provider. This information is not intended to replace advice given to you by your health care provider. Make sure you discuss any questions you have with your health  care provider. Document Revised: 01/08/2018 Document Reviewed: 12/17/2017 Elsevier Patient Education  [place].  Esophageal Dilatation Esophageal dilatation, also called esophageal dilation, is a procedure to widen or open (dilate) a blocked or narrowed part of the esophagus. The esophagus is the part of the body that moves food and liquid from the mouth to the stomach. You may need this procedure if:  You have a buildup of scar tissue in your esophagus that makes it difficult, painful, or impossible to swallow. This can be caused by gastroesophageal reflux disease (GERD).  You have cancer of the esophagus.  There is a problem with how food moves through your  esophagus. In some cases, you may need this procedure repeated at a later time to dilate the esophagus gradually. Tell a health care provider about:  Any allergies you have.  All medicines you are taking, including vitamins, herbs, eye drops, creams, and over-the-counter medicines.  Any problems you or family members have had with anesthetic medicines.  Any blood disorders you have.  Any surgeries you have had.  Any medical conditions you have.  Any antibiotic medicines you are required to take before dental procedures.  Whether you are pregnant or may be pregnant. What are the risks? Generally, this is a safe procedure. However, problems may occur, including:  Bleeding due to a tear in the lining of the esophagus.  A hole (perforation) in the esophagus. What happens before the procedure?  Follow instructions from your health care provider about eating or drinking restrictions.  Ask your health care provider about changing or stopping your regular medicines. This is especially important if you are taking diabetes medicines or blood thinners.  Plan to have someone take you home from the hospital or clinic.  Plan to have a responsible adult care for you for at least 24 hours after you leave the hospital or clinic. This is important. What happens during the procedure?  You may be given a medicine to help you relax (sedative).  A numbing medicine may be sprayed into the back of your throat, or you may gargle the medicine.  Your health care provider may perform the dilatation using various surgical instruments, such as: ? Simple dilators. This instrument is carefully placed in the esophagus to stretch it. ? Guided wire bougies. This involves using an endoscope to insert a wire into the esophagus. A dilator is passed over this wire to enlarge the esophagus. Then the wire is removed. ? Balloon dilators. An endoscope with a small balloon at the end is inserted into the esophagus.  The balloon is inflated to stretch the esophagus and open it up. The procedure may vary among health care providers and hospitals. What happens after the procedure?  Your blood pressure, heart rate, breathing rate, and blood oxygen level will be monitored until the medicines you were given have worn off.  Your throat may feel slightly sore and numb. This will improve slowly over time.  You will not be allowed to eat or drink until your throat is no longer numb.  When you are able to drink, urinate, and sit on the edge of the bed without nausea or dizziness, you may be able to return home. Follow these instructions at home:  Take over-the-counter and prescription medicines only as told by your health care provider.  Do not drive for 24 hours if you were given a sedative during your procedure.  You should have a responsible adult with you for 24 hours after the procedure.  Follow instructions from your health care provider about any eating or drinking restrictions.  Do not use any products that contain nicotine or tobacco, such as cigarettes and e-cigarettes. If you need help quitting, ask your health care provider.  Keep all follow-up visits as told by your health care provider. This is important. Get help right away if you:  Have a fever.  Have chest pain.  Have pain that is not relieved by medication.  Have trouble breathing.  Have trouble swallowing.  Vomit blood. Summary  Esophageal dilatation, also called esophageal dilation, is a procedure to widen or open (dilate) a blocked or narrowed part of the esophagus.  Plan to have someone take you home from the hospital or clinic.  For this procedure, a numbing medicine may be sprayed into the back of your throat, or you may gargle the medicine.  Do not drive for 24 hours if you were given a sedative during your procedure. This information is not intended to replace advice given to you by your health care provider. Make  sure you discuss any questions you have with your health care provider. Document Revised: 05/14/2019 Document Reviewed: 05/22/2017 Elsevier Patient Education  [place].  Colonoscopy, Adult, Care After This sheet gives you information about how to care for yourself after your procedure. Your health care provider may also give you more specific instructions. If you have problems or questions, contact your health care provider. What can I expect after the procedure? After the procedure, it is common to have:  A small amount of blood in your stool for 24 hours after the procedure.  Some gas.  Mild cramping or bloating of your abdomen. Follow these instructions at home: Eating and drinking   Drink enough fluid to keep your urine pale yellow.  Follow instructions from your health care provider about eating or drinking restrictions.  Resume your normal diet as instructed by your health care provider. Avoid heavy or fried foods that are hard to digest. Activity  Rest as told by your health care provider.  Avoid sitting for a long time without moving. Get up to take short walks every 1-2 hours. This is important to improve blood flow and breathing. Ask for help if you feel weak or unsteady.  Return to your normal activities as told by your health care provider. Ask your health care provider what activities are safe for you. Managing cramping and bloating   Try walking around when you have cramps or feel bloated.  Apply heat to your abdomen as told by your health care provider. Use the heat source that your health care provider recommends, such as a moist heat pack or a heating pad. ? Place a towel between your skin and the heat source. ? Leave the heat on for 20-30 minutes. ? Remove the heat if your skin turns bright red. This is especially important if you are unable to feel pain, heat, or cold. You may have a greater risk of getting burned. General instructions  For the  first 24 hours after the procedure: ? Do not drive or use machinery. ? Do not sign important documents. ? Do not drink alcohol. ? Do your regular daily activities at a slower pace than normal. ? Eat soft foods that are easy to digest.  Take over-the-counter and prescription medicines only as told by your health care provider.  Keep all follow-up visits as told by your health care provider. This is important. Contact a health care provider if:  You have blood in your stool 2-3 days after the procedure. Get help right away if you have:  More than a small spotting of blood in your stool.  Large blood clots in your stool.  Swelling of your abdomen.  Nausea or vomiting.  A fever.  Increasing pain in your abdomen that is not relieved with medicine. Summary  After the procedure, it is common to have a small amount of blood in your stool. You may also have mild cramping and bloating of your abdomen.  For the first 24 hours after the procedure, do not drive or use machinery, sign important documents, or drink alcohol.  Get help right away if you have a lot of blood in your stool, nausea or vomiting, a fever, or increased pain in your abdomen. This information is not intended to replace advice given to you by your health care provider. Make sure you discuss any questions you have with your health care provider. Document Revised: 02/10/2019 Document Reviewed: 02/10/2019 Elsevier Patient Education  [place] After These instructions provide you with information about caring for yourself after your procedure. Your health care provider may also give you more specific instructions. Your treatment has been planned according to current medical practices, but problems sometimes occur. Call your health care provider if you have any problems or questions after your procedure. What can I expect after the procedure? After your procedure, you may:  Feel  sleepy for several hours.  Feel clumsy and have poor balance for several hours.  Feel forgetful about what happened after the procedure.  Have poor judgment for several hours.  Feel nauseous or vomit.  Have a sore throat if you had a breathing tube during the procedure. Follow these instructions at home: For at least 24 hours after the procedure:      Have a responsible adult stay with you. It is important to have someone help care for you until you are awake and alert.  Rest as needed.  Do not: ? Participate in activities in which you could fall or become injured. ? Drive. ? Use heavy machinery. ? Drink alcohol. ? Take sleeping pills or medicines that cause drowsiness. ? Make important decisions or sign legal documents. ? Take care of children on your own. Eating and drinking  Follow the diet that is recommended by your health care provider.  If you vomit, drink water, juice, or soup when you can drink without vomiting.  Make sure you have little or no nausea before eating solid foods. General instructions  Take over-the-counter and prescription medicines only as told by your health care provider.  If you have sleep apnea, surgery and certain medicines can increase your risk for breathing problems. Follow instructions from your health care provider about wearing your sleep device: ? Anytime you are sleeping, including during daytime naps. ? While taking prescription pain medicines, sleeping medicines, or medicines that make you drowsy.  If you smoke, do not smoke without supervision.  Keep all follow-up visits as told by your health care provider. This is important. Contact a health care provider if:  You keep feeling nauseous or you keep vomiting.  You feel light-headed.  You develop a rash.  You have a fever. Get help right away if:  You have trouble breathing. Summary  For several hours after your procedure, you may feel sleepy and have poor  judgment.  Have a responsible adult stay with you for at least 24 hours or until  you are awake and alert. This information is not intended to replace advice given to you by your health care provider. Make sure you discuss any questions you have with your health care provider. Document Revised: 10/15/2017 Document Reviewed: 11/07/2015 Elsevier Patient Education  [place].

## 2020-05-31 ENCOUNTER — Encounter (HOSPITAL_COMMUNITY)
Admission: RE | Admit: 2020-05-31 | Discharge: 2020-05-31 | Disposition: A | Discharge status: Home / Self Care | Payer: Medicare Other | Source: Ambulatory Visit | Attending: Internal Medicine | Admitting: Internal Medicine

## 2020-05-31 ENCOUNTER — Encounter (HOSPITAL_COMMUNITY): Payer: Self-pay

## 2020-05-31 ENCOUNTER — Other Ambulatory Visit (HOSPITAL_COMMUNITY)
Admission: RE | Admit: 2020-05-31 | Discharge: 2020-05-31 | Disposition: A | Discharge status: Home / Self Care | Payer: BC Managed Care – PPO | Source: Ambulatory Visit | Attending: Internal Medicine | Admitting: Internal Medicine

## 2020-05-31 ENCOUNTER — Other Ambulatory Visit: Payer: Self-pay

## 2020-05-31 DIAGNOSIS — Z20822 Contact with and (suspected) exposure to covid-19: Secondary | ICD-10-CM | POA: Insufficient documentation

## 2020-05-31 DIAGNOSIS — K5080 Crohn's disease of both small and large intestine without complications: Secondary | ICD-10-CM

## 2020-05-31 DIAGNOSIS — K219 Gastro-esophageal reflux disease without esophagitis: Secondary | ICD-10-CM | POA: Diagnosis not present

## 2020-05-31 DIAGNOSIS — Z01812 Encounter for preprocedural laboratory examination: Secondary | ICD-10-CM | POA: Diagnosis not present

## 2020-05-31 DIAGNOSIS — Z86010 Personal history of colonic polyps: Secondary | ICD-10-CM | POA: Insufficient documentation

## 2020-05-31 DIAGNOSIS — R1319 Other dysphagia: Secondary | ICD-10-CM | POA: Insufficient documentation

## 2020-06-01 LAB — SARS CORONAVIRUS 2 (TAT 6-24 HRS): SARS Coronavirus 2: NEGATIVE

## 2020-06-02 ENCOUNTER — Encounter (HOSPITAL_COMMUNITY)
Admission: RE | Disposition: A | Discharge status: Home / Self Care | Payer: Self-pay | Source: Home / Self Care | Attending: Internal Medicine

## 2020-06-02 ENCOUNTER — Ambulatory Visit (HOSPITAL_COMMUNITY): Payer: Medicare Other | Admitting: Certified Registered"

## 2020-06-02 ENCOUNTER — Encounter (HOSPITAL_COMMUNITY): Payer: Self-pay | Admitting: Internal Medicine

## 2020-06-02 ENCOUNTER — Ambulatory Visit (HOSPITAL_COMMUNITY)
Admission: RE | Admit: 2020-06-02 | Discharge: 2020-06-02 | Disposition: A | Discharge status: Home / Self Care | Payer: Medicare Other | Attending: Internal Medicine | Admitting: Internal Medicine

## 2020-06-02 DIAGNOSIS — Z1211 Encounter for screening for malignant neoplasm of colon: Secondary | ICD-10-CM | POA: Diagnosis not present

## 2020-06-02 DIAGNOSIS — K5730 Diverticulosis of large intestine without perforation or abscess without bleeding: Secondary | ICD-10-CM | POA: Diagnosis not present

## 2020-06-02 DIAGNOSIS — Z980 Intestinal bypass and anastomosis status: Secondary | ICD-10-CM | POA: Diagnosis not present

## 2020-06-02 DIAGNOSIS — K5010 Crohn's disease of large intestine without complications: Secondary | ICD-10-CM | POA: Diagnosis not present

## 2020-06-02 DIAGNOSIS — K644 Residual hemorrhoidal skin tags: Secondary | ICD-10-CM | POA: Insufficient documentation

## 2020-06-02 DIAGNOSIS — R1319 Other dysphagia: Secondary | ICD-10-CM

## 2020-06-02 DIAGNOSIS — K2289 Other specified disease of esophagus: Secondary | ICD-10-CM | POA: Insufficient documentation

## 2020-06-02 DIAGNOSIS — K5080 Crohn's disease of both small and large intestine without complications: Secondary | ICD-10-CM | POA: Insufficient documentation

## 2020-06-02 DIAGNOSIS — Z888 Allergy status to other drugs, medicaments and biological substances status: Secondary | ICD-10-CM | POA: Diagnosis not present

## 2020-06-02 DIAGNOSIS — Z86010 Personal history of colonic polyps: Secondary | ICD-10-CM

## 2020-06-02 DIAGNOSIS — K6289 Other specified diseases of anus and rectum: Secondary | ICD-10-CM | POA: Diagnosis not present

## 2020-06-02 DIAGNOSIS — K5000 Crohn's disease of small intestine without complications: Secondary | ICD-10-CM | POA: Diagnosis not present

## 2020-06-02 DIAGNOSIS — K449 Diaphragmatic hernia without obstruction or gangrene: Secondary | ICD-10-CM | POA: Insufficient documentation

## 2020-06-02 DIAGNOSIS — K219 Gastro-esophageal reflux disease without esophagitis: Secondary | ICD-10-CM | POA: Insufficient documentation

## 2020-06-02 DIAGNOSIS — Z880 Allergy status to penicillin: Secondary | ICD-10-CM | POA: Insufficient documentation

## 2020-06-02 DIAGNOSIS — R1314 Dysphagia, pharyngoesophageal phase: Secondary | ICD-10-CM | POA: Insufficient documentation

## 2020-06-02 DIAGNOSIS — Z09 Encounter for follow-up examination after completed treatment for conditions other than malignant neoplasm: Secondary | ICD-10-CM | POA: Diagnosis not present

## 2020-06-02 HISTORY — PX: BIOPSY: SHX5522

## 2020-06-02 HISTORY — PX: ESOPHAGOGASTRODUODENOSCOPY (EGD) WITH PROPOFOL: SHX5813

## 2020-06-02 HISTORY — PX: COLONOSCOPY WITH PROPOFOL: SHX5780

## 2020-06-02 HISTORY — PX: ESOPHAGEAL DILATION: SHX303

## 2020-06-02 SURGERY — COLONOSCOPY WITH PROPOFOL
Anesthesia: General

## 2020-06-02 MED ORDER — CHLORHEXIDINE GLUCONATE CLOTH 2 % EX PADS: 6.0000 | MEDICATED_PAD | Freq: Once | CUTANEOUS | Status: DC

## 2020-06-02 MED ORDER — PHENYLEPHRINE 40 MCG/ML (10ML) SYRINGE FOR IV PUSH (FOR BLOOD PRESSURE SUPPORT)
PREFILLED_SYRINGE | INTRAVENOUS | Status: AC
  Filled 2020-06-02: qty 10

## 2020-06-02 MED ORDER — PHENYLEPHRINE 40 MCG/ML (10ML) SYRINGE FOR IV PUSH (FOR BLOOD PRESSURE SUPPORT)
PREFILLED_SYRINGE | INTRAVENOUS | Status: DC | PRN
  Administered 2020-06-02: 80 ug via INTRAVENOUS

## 2020-06-02 MED ORDER — LIDOCAINE VISCOUS HCL 2 % MT SOLN
OROMUCOSAL | Status: AC
  Filled 2020-06-02: qty 15

## 2020-06-02 MED ORDER — LACTATED RINGERS IV SOLN
INTRAVENOUS | Status: DC | PRN
  Administered 2020-06-02: 1000 mL via INTRAVENOUS

## 2020-06-02 MED ORDER — LIDOCAINE VISCOUS HCL 2 % MT SOLN
15.0000 mL | Freq: Once | OROMUCOSAL | Status: AC
  Administered 2020-06-02: 15 mL via OROMUCOSAL

## 2020-06-02 MED ORDER — LACTATED RINGERS IV SOLN: INTRAVENOUS | Status: DC

## 2020-06-02 MED ORDER — PROPOFOL 500 MG/50ML IV EMUL
INTRAVENOUS | Status: DC | PRN
  Administered 2020-06-02: 100 ug/kg/min via INTRAVENOUS

## 2020-06-02 MED ORDER — LIDOCAINE HCL (CARDIAC) PF 100 MG/5ML IV SOSY
PREFILLED_SYRINGE | INTRAVENOUS | Status: DC | PRN
  Administered 2020-06-02: 100 mg via INTRAVENOUS

## 2020-06-02 MED ORDER — GLYCOPYRROLATE 0.2 MG/ML IJ SOLN
INTRAMUSCULAR | Status: AC
  Filled 2020-06-02: qty 1

## 2020-06-02 MED ORDER — GLYCOPYRROLATE 0.2 MG/ML IJ SOLN
0.2000 mg | Freq: Once | INTRAMUSCULAR | Status: AC
  Administered 2020-06-02: 0.2 mg via INTRAVENOUS

## 2020-06-02 MED ORDER — STERILE WATER FOR IRRIGATION IR SOLN
Status: DC | PRN
  Administered 2020-06-02: 1.5 mL

## 2020-06-02 MED ORDER — PROPOFOL 10 MG/ML IV BOLUS
INTRAVENOUS | Status: DC | PRN
  Administered 2020-06-02: 20 mg via INTRAVENOUS
  Administered 2020-06-02 (×2): 50 mg via INTRAVENOUS
  Administered 2020-06-02: 80 mg via INTRAVENOUS

## 2020-06-02 NOTE — Anesthesia Preprocedure Evaluation (Addendum)
Anesthesia Evaluation  Patient identified by MRN, date of birth, ID band Patient awake    Reviewed: Allergy & Precautions, H&P , NPO status , Patient's Chart, lab work & pertinent test results, reviewed documented beta blocker date and time   Airway Mallampati: II  TM Distance: >3 FB Neck ROM: full    Dental no notable dental hx. (+) Teeth Intact, Dental Advisory Given, Caps   Pulmonary neg pulmonary ROS,    Pulmonary exam normal breath sounds clear to auscultation       Cardiovascular Exercise Tolerance: Good negative cardio ROS   Rhythm:regular Rate:Normal     Neuro/Psych negative neurological ROS  negative psych ROS   GI/Hepatic Neg liver ROS, GERD  Medicated,  Endo/Other  negative endocrine ROS  Renal/GU negative Renal ROS  negative genitourinary   Musculoskeletal   Abdominal   Peds  Hematology negative hematology ROS (+)   Anesthesia Other Findings   Reproductive/Obstetrics negative OB ROS                            Anesthesia Physical Anesthesia Plan  ASA: II  Anesthesia Plan: General   Post-op Pain Management:    Induction:   PONV Risk Score and Plan:   Airway Management Planned:   Additional Equipment:   Intra-op Plan:   Post-operative Plan:   Informed Consent: I have reviewed the patients History and Physical, chart, labs and discussed the procedure including the risks, benefits and alternatives for the proposed anesthesia with the patient or authorized representative who has indicated his/her understanding and acceptance.     Dental Advisory Given  Plan Discussed with: CRNA  Anesthesia Plan Comments:         Anesthesia Quick Evaluation

## 2020-06-02 NOTE — Transfer of Care (Signed)
Immediate Anesthesia Transfer of Care Note  Patient: Gail Henry  Procedure(s) Performed: COLONOSCOPY WITH PROPOFOL (N/A ) ESOPHAGOGASTRODUODENOSCOPY (EGD) WITH PROPOFOL (N/A ) ESOPHAGEAL DILATION (N/A ) BIOPSY  Patient Location: PACU  Anesthesia Type:General  Level of Consciousness: awake and oriented  Airway & Oxygen Therapy: Patient Spontanous Breathing  Post-op Assessment: Report given to RN and Post -op Vital signs reviewed and stable  Post vital signs: Reviewed and stable  Last Vitals:  Vitals Value Taken Time  BP 82/47 06/02/20 0804  Temp    Pulse 74 06/02/20 0805  Resp 20 06/02/20 0805  SpO2 100 % 06/02/20 0805  Vitals shown include unvalidated device data.  Last Pain:  Vitals:   06/02/20 0727  TempSrc:   PainSc: 0-No pain      Patients Stated Pain Goal: 9 (25/67/20 9198)  Complications: No complications documented.

## 2020-06-02 NOTE — H&P (Signed)
Gail Henry is an 74 y.o. female.   Chief Complaint: Patient is here for esophagogastric duodenoscopy with esophageal dilation followed by colonoscopy. HPI: Patient is 74 year old Caucasian female with several year history of Crohn's disease primarily involving small bowel but some of the colon to remote surgeries who is here for surveillance colonoscopy.  Last exam was 7 years ago.  She has not experienced relapse in the past 2 years or so.  She was treated for diverticulitis by Dr. Quillian Quince about 2 months ago.  She says heartburn is well controlled since she has been back on AcipHex.  For the last few months she is noted difficulty swallowing solids.  She had one episode of food impaction relieved spontaneously.  She is having 2-3 bowel movements per day.  She denies abdominal pain rectal bleeding or melena. Family history is negative for CRC.  Past Medical History:  Diagnosis Date  . Abdominal pain 10/18/2010  . Constipation 10/18/2010  . Crohn's disease ([place]) 10/18/2010   SMALL BOWEL and colon.  . Diarrhea 10/18/2010    Past Surgical History:  Procedure Laterality Date  . COLONOSCOPY  10/16/2001  . COLONOSCOPY  03/15/2012   Procedure: COLONOSCOPY;  Surgeon: Rogene Houston, MD;  Location: [facility];  Service: Endoscopy;  Laterality: N/A;  730  . LAPAROSCOPIC CHOLECYSTECTOMY  12/17/96   DEMASON    Family History  Problem Relation Age of Onset  . Heart disease Father   . Healthy Sister   . Healthy Brother   . Healthy Sister   . Healthy Daughter   . Healthy Son    Social History:  reports that she has never smoked. She has never used smokeless tobacco. She reports that she does not drink alcohol and does not use drugs.  Allergies:  Allergies  Allergen Reactions  . Penicillins Hives  . Dexlansoprazole Other (See Comments)    Muscle pain  . Esomeprazole Magnesium Nausea Only    Medications Prior to Admission  Medication Sig Dispense Refill  . acetaminophen (TYLENOL)  500 MG tablet Take 500 mg by mouth every 6 (six) hours as needed for moderate pain or headache.    . Calcium-Vitamin D-Vitamin K (VIACTIV PO) Take 1 tablet by mouth 3 (three) times daily.    . Carboxymethylcellul-Glycerin (REFRESH RELIEVA OP) Place 1-2 drops into both eyes 3 (three) times daily as needed (dry eyes).     . Cholecalciferol (VITAMIN D3) 2000 UNITS TABS Take 2,000 Units by mouth daily.     . folic acid (FOLVITE) 440 MCG tablet Take 400 mcg by mouth daily.      . mesalamine (PENTASA) 500 MG CR capsule Take 2 capsules (1,000 mg total) by mouth 4 (four) times daily. 240 capsule 11  . Multiple Vitamins-Calcium (VIACTIV MULTI-VITAMIN) CHEW Chew 2 tablets by mouth daily.     . RABEprazole (ACIPHEX) 20 MG tablet Take 1 tablet (20 mg total) by mouth daily before breakfast. 30 tablet 5  . sodium chloride (OCEAN) 0.65 % SOLN nasal spray Place 1 spray into both nostrils daily.    Marland Kitchen tretinoin (RETIN-A) 0.025 % cream Apply 1 application topically at bedtime as needed (acne).       Results for orders placed or performed during the hospital encounter of 05/31/20 (from the past 48 hour(s))  SARS CORONAVIRUS 2 (TAT 6-24 HRS) Nasopharyngeal Nasopharyngeal Swab     Status: None   Collection Time: 05/31/20 11:08 AM   Specimen: Nasopharyngeal Swab  Result Value Ref Range   SARS  Coronavirus 2 NEGATIVE NEGATIVE    Comment: (NOTE) SARS-CoV-2 target nucleic acids are NOT DETECTED.  The SARS-CoV-2 RNA is generally detectable in upper and lower respiratory specimens during the acute phase of infection. Negative results do not preclude SARS-CoV-2 infection, do not rule out co-infections with other pathogens, and should not be used as the sole basis for treatment or other patient management decisions. Negative results must be combined with clinical observations, patient history, and epidemiological information. The expected result is Negative.  Fact Sheet for  Patients: SugarRoll.be  Fact Sheet for Healthcare Providers: https://www.woods-mathews.com/  This test is not yet approved or cleared by the [place] FDA and  has been authorized for detection and/or diagnosis of SARS-CoV-2 by FDA under an Emergency Use Authorization (EUA). This EUA will remain  in effect (meaning this test can be used) for the duration of the COVID-19 declaration under Se ction 564(b)(1) of the Act, 21 U.S.C. section 360bbb-3(b)(1), unless the authorization is terminated or revoked sooner.  Performed at [facility], [place] [address], [place], [place] [address]    No results found.  Review of Systems  Blood pressure (!) 169/99, temperature 98.2 F (36.8 C), temperature source Oral, resp. rate 18, SpO2 97 %. Physical Exam HENT:     Mouth/Throat:     Mouth: Mucous membranes are moist.     Pharynx: Oropharynx is clear.  Eyes:     General: No scleral icterus.    Conjunctiva/sclera: Conjunctivae normal.  Cardiovascular:     Rate and Rhythm: Normal rate and regular rhythm.     Heart sounds: Murmur heard.      Comments: Grade 2/6 short systolic murmur noted at aortic area. Pulmonary:     Effort: Pulmonary effort is normal.     Breath sounds: Normal breath sounds.  Abdominal:     Comments: Abdomen is full.  She has long midline scar along with horizontal scar in right lower quadrant of her abdomen.  Abdomen is soft and nontender with organomegaly or masses.  Musculoskeletal:        General: No swelling.     Cervical back: Neck supple.  Lymphadenopathy:     Cervical: No cervical adenopathy.  Skin:    General: Skin is warm and dry.  Neurological:     Mental Status: She is alert.      Assessment/Plan  Esophageal dysphagia in patient with chronic GERD. History of Crohn's disease involving small and large bowel. Esophagogastroduodenoscopy with esophageal dilation and surveillance colonoscopy.   Hildred Laser, MD 06/02/2020, 7:21 AM

## 2020-06-02 NOTE — Op Note (Signed)
[facility] Patient Name: Gail Henry Procedure Date: 06/02/2020 7:42 AM MRN: [id number] Date of Birth: May 20, 1946 Attending MD: Hildred Laser , MD CSN: [id number] Age: 74 Admit Type: Outpatient Procedure:                Colonoscopy Indications:              High risk colon cancer surveillance: Crohn's                            disease small intestine, High risk colon cancer                            surveillance: Crohn's disease large intestine Providers:                Hildred Laser, MD, Caprice Kluver, Kristine L. Risa Grill, Technician, Aram Candela Referring MD:             Mitzie Na. Quillian Quince, MD Medicines:                Propofol per Anesthesia Complications:            No immediate complications. Estimated Blood Loss:     Estimated blood loss was minimal. Procedure:                Pre-Anesthesia Assessment:                           - Prior to the procedure, a History and Physical                            was performed, and patient medications and                            allergies were reviewed. The patient's tolerance of                            previous anesthesia was also reviewed. The risks                            and benefits of the procedure and the sedation                            options and risks were discussed with the patient.                            All questions were answered, and informed consent                            was obtained. Prior Anticoagulants: The patient has                            taken no previous anticoagulant or antiplatelet  agents except for aspirin. ASA Grade Assessment: II                            - A patient with mild systemic disease. After                            reviewing the risks and benefits, the patient was                            deemed in satisfactory condition to undergo the                            procedure.                           After  obtaining informed consent, the colonoscope                            was passed under direct vision. Throughout the                            procedure, the patient's blood pressure, pulse, and                            oxygen saturations were monitored continuously. The                            [id number]-[id number] ([id number]) scope was introduced through                            the anus and advanced to the the ileocolonic                            anastomosis. The colonoscopy was performed without                            difficulty. The patient tolerated the procedure                            well. The quality of the bowel preparation was                            excellent. The terminal ileum and the rectum were                            photographed. Scope In: 7:44:50 AM Scope Out: 7:59:40 AM Scope Withdrawal Time: 0 hours 9 minutes 47 seconds  Total Procedure Duration: 0 hours 14 minutes 50 seconds  Findings:      Skin tags were found on perianal exam.      There was evidence of a prior end-to-side ileo-colonic anastomosis at       the hepatic flexure. This was patent and was characterized by edema,       erosion and erythema. The anastomosis was not traversed. This was  biopsied with a cold forceps for histology. The pathology specimen was       placed into Bottle Number 2.      Scattered diverticula were found in the sigmoid colon and transverse       colon.      External hemorrhoids were found during retroflexion.      Anal papilla(e) were hypertrophied. Impression:               - Perianal skin tags found on perianal exam.                           - Patent end-to-side ileo-colonic anastomosis,                            characterized by edema, erosion and erythema.                            ileitis involving neoterminal ileum for 2 cm.                            Biopsied.                           - Diverticulosis in the sigmoid colon and in the                             transverse colon.                           - External hemorrhoids.                           - Anal papilla(e) were hypertrophied. Moderate Sedation:      Per Anesthesia Care Recommendation:           - Patient has a contact number available for                            emergencies. The signs and symptoms of potential                            delayed complications were discussed with the                            patient. Return to normal activities tomorrow.                            Written discharge instructions were provided to the                            patient.                           - Resume previous diet today.                           - Continue present medications.                           -  No aspirin, ibuprofen, naproxen, or other                            non-steroidal anti-inflammatory drugs.                           - Await pathology results.                           - Repeat colonoscopy in 5 years for surveillance. Procedure Code(s):        --- Professional ---                           [phone], Colonoscopy, flexible; with biopsy, single                            or multiple Diagnosis Code(s):        --- Professional ---                           K64.4, Residual hemorrhoidal skin tags                           K50.00, Crohn's disease of small intestine without                            complications                           K50.10, Crohn's disease of large intestine without                            complications                           Z98.0, Intestinal bypass and anastomosis status                           K62.89, Other specified diseases of anus and rectum                           K57.30, Diverticulosis of large intestine without                            perforation or abscess without bleeding CPT copyright 2019 [employer]. All rights reserved. The codes documented in this report are preliminary and upon coder  review may  be revised to meet current compliance requirements. Hildred Laser, MD Hildred Laser, MD 06/02/2020 8:21:28 AM This report has been signed electronically. Number of Addenda: 0

## 2020-06-02 NOTE — Anesthesia Postprocedure Evaluation (Signed)
Anesthesia Post Note  Patient: Gail Henry  Procedure(s) Performed: COLONOSCOPY WITH PROPOFOL (N/A ) ESOPHAGOGASTRODUODENOSCOPY (EGD) WITH PROPOFOL (N/A ) ESOPHAGEAL DILATION (N/A ) BIOPSY  Patient location during evaluation: Phase II Anesthesia Type: General Level of consciousness: awake and alert and oriented Pain management: pain level controlled Vital Signs Assessment: post-procedure vital signs reviewed and stable Respiratory status: spontaneous breathing, nonlabored ventilation and respiratory function stable Cardiovascular status: blood pressure returned to baseline and stable Postop Assessment: no apparent nausea or vomiting Anesthetic complications: no   No complications documented.   Last Vitals:  Vitals:   06/02/20 0815 06/02/20 0835  BP: (!) 93/55 117/89  Pulse: 68 69  Resp: 18 16  Temp:  36.9 C  SpO2: 100% 100%    Last Pain:  Vitals:   06/02/20 0835  TempSrc: Oral  PainSc: 0-No pain                 Orlie Dakin

## 2020-06-02 NOTE — Discharge Instructions (Signed)
No aspirin or NSAIDs. Resume usual medications as before. Resume usual diet. No driving for 24 hours. Physician will call with biopsy results.  PATIENT INSTRUCTIONS POST-ANESTHESIA  IMMEDIATELY FOLLOWING SURGERY:  Do not drive or operate machinery for the first twenty four hours after surgery.  Do not make any important decisions for twenty four hours after surgery or while taking narcotic pain medications or sedatives.  If you develop intractable nausea and vomiting or a severe headache please notify your doctor immediately.  FOLLOW-UP:  Please make an appointment with your surgeon as instructed. You do not need to follow up with anesthesia unless specifically instructed to do so.  WOUND CARE INSTRUCTIONS (if applicable):  Keep a dry clean dressing on the anesthesia/puncture wound site if there is drainage.  Once the wound has quit draining you may leave it open to air.  Generally you should leave the bandage intact for twenty four hours unless there is drainage.  If the epidural site drains for more than 36-48 hours please call the anesthesia department.  QUESTIONS?:  Please feel free to call your physician or the hospital operator if you have any questions, and they will be happy to assist you.      Upper Endoscopy, Adult, Care After This sheet gives you information about how to care for yourself after your procedure. Your health care provider may also give you more specific instructions. If you have problems or questions, contact your health care provider. What can I expect after the procedure? After the procedure, it is common to have:  A sore throat.  Mild stomach pain or discomfort.  Bloating.  Nausea. Follow these instructions at home:   Follow instructions from your health care provider about what to eat or drink after your procedure.  Return to your normal activities as told by your health care provider. Ask your health care provider what activities are safe for  you.  Take over-the-counter and prescription medicines only as told by your health care provider.  Do not drive for 24 hours if you were given a sedative during your procedure.  Keep all follow-up visits as told by your health care provider. This is important. Contact a health care provider if you have:  A sore throat that lasts longer than one day.  Trouble swallowing. Get help right away if:  You vomit blood or your vomit looks like coffee grounds.  You have: ? A fever. ? Bloody, black, or tarry stools. ? A severe sore throat or you cannot swallow. ? Difficulty breathing. ? Severe pain in your chest or abdomen. Summary  After the procedure, it is common to have a sore throat, mild stomach discomfort, bloating, and nausea.  Do not drive for 24 hours if you were given a sedative during the procedure.  Follow instructions from your health care provider about what to eat or drink after your procedure.  Return to your normal activities as told by your health care provider. This information is not intended to replace advice given to you by your health care provider. Make sure you discuss any questions you have with your health care provider. Document Revised: 01/08/2018 Document Reviewed: 12/17/2017 Elsevier Patient Education  [place].    Colonoscopy, Adult, Care After This sheet gives you information about how to care for yourself after your procedure. Your doctor may also give you more specific instructions. If you have problems or questions, call your doctor. What can I expect after the procedure? After the procedure, it is  common to have:  A small amount of blood in your poop (stool) for 24 hours.  Some gas.  Mild cramping or bloating in your belly (abdomen). Follow these instructions at home: Eating and drinking   Drink enough fluid to keep your pee (urine) pale yellow.  Follow instructions from your doctor about what you cannot eat or drink.  Return  to your normal diet as told by your doctor. Avoid heavy or fried foods that are hard to digest. Activity  Rest as told by your doctor.  Do not sit for a long time without moving. Get up to take short walks every 1-2 hours. This is important. Ask for help if you feel weak or unsteady.  Return to your normal activities as told by your doctor. Ask your doctor what activities are safe for you. To help cramping and bloating:   Try walking around.  Put heat on your belly as told by your doctor. Use the heat source that your doctor recommends, such as a moist heat pack or a heating pad. ? Put a towel between your skin and the heat source. ? Leave the heat on for 20-30 minutes. ? Remove the heat if your skin turns bright red. This is very important if you are unable to feel pain, heat, or cold. You may have a greater risk of getting burned. General instructions  For the first 24 hours after the procedure: ? Do not drive or use machinery. ? Do not sign important documents. ? Do not drink alcohol. ? Do your daily activities more slowly than normal. ? Eat foods that are soft and easy to digest.  Take over-the-counter or prescription medicines only as told by your doctor.  Keep all follow-up visits as told by your doctor. This is important. Contact a doctor if:  You have blood in your poop 2-3 days after the procedure. Get help right away if:  You have more than a small amount of blood in your poop.  You see large clumps of tissue (blood clots) in your poop.  Your belly is swollen.  You feel like you may vomit (nauseous).  You vomit.  You have a fever.  You have belly pain that gets worse, and medicine does not help your pain. Summary  After the procedure, it is common to have a small amount of blood in your poop. You may also have mild cramping and bloating in your belly.  For the first 24 hours after the procedure, do not drive or use machinery, do not sign important  documents, and do not drink alcohol.  Get help right away if you have a lot of blood in your poop, feel like you may vomit, have a fever, or have more belly pain. This information is not intended to replace advice given to you by your health care provider. Make sure you discuss any questions you have with your health care provider. Document Revised: 02/10/2019 Document Reviewed: 02/10/2019 Elsevier Patient Education  [place].   Hiatal Hernia  A hiatal hernia occurs when part of the stomach slides above the muscle that separates the abdomen from the chest (diaphragm). A person can be born with a hiatal hernia (congenital), or it may develop over time. In almost all cases of hiatal hernia, only the top part of the stomach pushes through the diaphragm. Many people have a hiatal hernia with no symptoms. The larger the hernia, the more likely it is that you will have symptoms. In some cases, a  hiatal hernia allows stomach acid to flow back into the tube that carries food from your mouth to your stomach (esophagus). This may cause heartburn symptoms. Severe heartburn symptoms may mean that you have developed a condition called gastroesophageal reflux disease (GERD). What are the causes? This condition is caused by a weakness in the opening (hiatus) where the esophagus passes through the diaphragm to attach to the upper part of the stomach. A person may be born with a weakness in the hiatus, or a weakness can develop over time. What increases the risk? This condition is more likely to develop in:  Older people. Age is a major risk factor for a hiatal hernia, especially if you are over the age of 44.  Pregnant women.  People who are overweight.  People who have frequent constipation. What are the signs or symptoms? Symptoms of this condition usually develop in the form of GERD symptoms. Symptoms include:  Heartburn.  Belching.  Indigestion.  Trouble swallowing.  Coughing or  wheezing.  Sore throat.  Hoarseness.  Chest pain.  Nausea and vomiting. How is this diagnosed? This condition may be diagnosed during testing for GERD. Tests that may be done include:  X-rays of your stomach or chest.  An upper gastrointestinal (GI) series. This is an X-ray exam of your GI tract that is taken after you swallow a chalky liquid that shows up clearly on the X-ray.  Endoscopy. This is a procedure to look into your stomach using a thin, flexible tube that has a tiny camera and light on the end of it. How is this treated? This condition may be treated by:  Dietary and lifestyle changes to help reduce GERD symptoms.  Medicines. These may include: ? Over-the-counter antacids. ? Medicines that make your stomach empty more quickly. ? Medicines that block the production of stomach acid (H2 blockers). ? Stronger medicines to reduce stomach acid (proton pump inhibitors).  Surgery to repair the hernia, if other treatments are not helping. If you have no symptoms, you may not need treatment. Follow these instructions at home: Lifestyle and activity  Do not use any products that contain nicotine or tobacco, such as cigarettes and e-cigarettes. If you need help quitting, ask your health care provider.  Try to achieve and maintain a healthy body weight.  Avoid putting pressure on your abdomen. Anything that puts pressure on your abdomen increases the amount of acid that may be pushed up into your esophagus. ? Avoid bending over, especially after eating. ? Raise the head of your bed by putting blocks under the legs. This keeps your head and esophagus higher than your stomach. ? Do not wear tight clothing around your chest or stomach. ? Try not to strain when having a bowel movement, when urinating, or when lifting heavy objects. Eating and drinking  Avoid foods that can worsen GERD symptoms. These may include: ? Fatty foods, like fried foods. ? Citrus fruits, like oranges  or lemon. ? Other foods and drinks that contain acid, like orange juice or tomatoes. ? Spicy food. ? Chocolate.  Eat frequent small meals instead of three large meals a day. This helps prevent your stomach from getting too full. ? Eat slowly. ? Do not lie down right after eating. ? Do not eat 1-2 hours before bed.  Do not drink beverages with caffeine. These include cola, coffee, cocoa, and tea.  Do not drink alcohol. General instructions  Take over-the-counter and prescription medicines only as told by your health care provider.  Keep all follow-up visits as told by your health care provider. This is important. Contact a health care provider if:  Your symptoms are not controlled with medicines or lifestyle changes.  You are having trouble swallowing.  You have coughing or wheezing that will not go away. Get help right away if:  Your pain is getting worse.  Your pain spreads to your arms, neck, jaw, teeth, or back.  You have shortness of breath.  You sweat for no reason.  You feel sick to your stomach (nauseous) or you vomit.  You vomit blood.  You have bright red blood in your stools.  You have black, tarry stools. This information is not intended to replace advice given to you by your health care provider. Make sure you discuss any questions you have with your health care provider. Document Revised: 06/29/2017 Document Reviewed: 02/19/2017 Elsevier Patient Education  [place].   Diverticulosis  Diverticulosis is a condition that develops when small pouches (diverticula) form in the wall of the large intestine (colon). The colon is where water is absorbed and stool (feces) is formed. The pouches form when the inside layer of the colon pushes through weak spots in the outer layers of the colon. You may have a few pouches or many of them. The pouches usually do not cause problems unless they become inflamed or infected. When this happens, the condition is  called diverticulitis. What are the causes? The cause of this condition is not known. What increases the risk? The following factors may make you more likely to develop this condition:  Being older than age 71. Your risk for this condition increases with age. Diverticulosis is rare among people younger than age 30. By age 64, many people have it.  Eating a low-fiber diet.  Having frequent constipation.  Being overweight.  Not getting enough exercise.  Smoking.  Taking over-the-counter pain medicines, like aspirin and ibuprofen.  Having a family history of diverticulosis. What are the signs or symptoms? In most people, there are no symptoms of this condition. If you do have symptoms, they may include:  Bloating.  Cramps in the abdomen.  Constipation or diarrhea.  Pain in the lower left side of the abdomen. How is this diagnosed? Because diverticulosis usually has no symptoms, it is most often diagnosed during an exam for other colon problems. The condition may be diagnosed by:  Using a flexible scope to examine the colon (colonoscopy).  Taking an X-ray of the colon after dye has been put into the colon (barium enema).  Having a CT scan. How is this treated? You may not need treatment for this condition. Your health care provider may recommend treatment to prevent problems. You may need treatment if you have symptoms or if you previously had diverticulitis. Treatment may include:  Eating a high-fiber diet.  Taking a fiber supplement.  Taking a live bacteria supplement (probiotic).  Taking medicine to relax your colon. Follow these instructions at home: Medicines  Take over-the-counter and prescription medicines only as told by your health care provider.  If told by your health care provider, take a fiber supplement or probiotic. Constipation prevention Your condition may cause constipation. To prevent or treat constipation, you may need to:  Drink enough fluid  to keep your urine pale yellow.  Take over-the-counter or prescription medicines.  Eat foods that are high in fiber, such as beans, whole grains, and fresh fruits and vegetables.  Limit foods that are high in fat and processed sugars,  such as fried or sweet foods.  General instructions  Try not to strain when you have a bowel movement.  Keep all follow-up visits as told by your health care provider. This is important. Contact a health care provider if you:  Have pain in your abdomen.  Have bloating.  Have cramps.  Have not had a bowel movement in 3 days. Get help right away if:  Your pain gets worse.  Your bloating becomes very bad.  You have a fever or chills, and your symptoms suddenly get worse.  You vomit.  You have bowel movements that are bloody or black.  You have bleeding from your rectum. Summary  Diverticulosis is a condition that develops when small pouches (diverticula) form in the wall of the large intestine (colon).  You may have a few pouches or many of them.  This condition is most often diagnosed during an exam for other colon problems.  Treatment may include increasing the fiber in your diet, taking supplements, or taking medicines. This information is not intended to replace advice given to you by your health care provider. Make sure you discuss any questions you have with your health care provider. Document Revised: 02/13/2019 Document Reviewed: 02/13/2019 Elsevier Patient Education  [place].   Hemorrhoids Hemorrhoids are swollen veins that may develop:  In the butt (rectum). These are called internal hemorrhoids.  Around the opening of the butt (anus). These are called external hemorrhoids. Hemorrhoids can cause pain, itching, or bleeding. Most of the time, they do not cause serious problems. They usually get better with diet changes, lifestyle changes, and other home treatments. What are the causes? This condition may be caused  by:  Having trouble pooping (constipation).  Pushing hard (straining) to poop.  Watery poop (diarrhea).  Pregnancy.  Being very overweight (obese).  Sitting for long periods of time.  Heavy lifting or other activity that causes you to strain.  Anal sex.  Riding a bike for a long period of time. What are the signs or symptoms? Symptoms of this condition include:  Pain.  Itching or soreness in the butt.  Bleeding from the butt.  Leaking poop.  Swelling in the area.  One or more lumps around the opening of your butt. How is this diagnosed? A doctor can often diagnose this condition by looking at the affected area. The doctor may also:  Do an exam that involves feeling the area with a gloved hand (digital rectal exam).  Examine the area inside your butt using a small tube (anoscope).  Order blood tests. This may be done if you have lost a lot of blood.  Have you get a test that involves looking inside the colon using a flexible tube with a camera on the end (sigmoidoscopy or colonoscopy). How is this treated? This condition can usually be treated at home. Your doctor may tell you to change what you eat, make lifestyle changes, or try home treatments. If these do not help, procedures can be done to remove the hemorrhoids or make them smaller. These may involve:  Placing rubber bands at the base of the hemorrhoids to cut off their blood supply.  Injecting medicine into the hemorrhoids to shrink them.  Shining a type of light energy onto the hemorrhoids to cause them to fall off.  Doing surgery to remove the hemorrhoids or cut off their blood supply. Follow these instructions at home: Eating and drinking   Eat foods that have a lot of fiber in them.  These include whole grains, beans, nuts, fruits, and vegetables.  Ask your doctor about taking products that have added fiber (fibersupplements).  Reduce the amount of fat in your diet. You can do this by: ? Eating  low-fat dairy products. ? Eating less red meat. ? Avoiding processed foods.  Drink enough fluid to keep your pee (urine) pale yellow. Managing pain and swelling   Take a warm-water bath (sitz bath) for 20 minutes to ease pain. Do this 3-4 times a day. You may do this in a bathtub or using a portable sitz bath that fits over the toilet.  If told, put ice on the painful area. It may be helpful to use ice between your warm baths. ? Put ice in a plastic bag. ? Place a towel between your skin and the bag. ? Leave the ice on for 20 minutes, 2-3 times a day. General instructions  Take over-the-counter and prescription medicines only as told by your doctor. ? Medicated creams and medicines may be used as told.  Exercise often. Ask your doctor how much and what kind of exercise is best for you.  Go to the bathroom when you have the urge to poop. Do not wait.  Avoid pushing too hard when you poop.  Keep your butt dry and clean. Use wet toilet paper or moist towelettes after pooping.  Do not sit on the toilet for a long time.  Keep all follow-up visits as told by your doctor. This is important. Contact a doctor if you:  Have pain and swelling that do not get better with treatment or medicine.  Have trouble pooping.  Cannot poop.  Have pain or swelling outside the area of the hemorrhoids. Get help right away if you have:  Bleeding that will not stop. Summary  Hemorrhoids are swollen veins in the butt or around the opening of the butt.  They can cause pain, itching, or bleeding.  Eat foods that have a lot of fiber in them. These include whole grains, beans, nuts, fruits, and vegetables.  Take a warm-water bath (sitz bath) for 20 minutes to ease pain. Do this 3-4 times a day. This information is not intended to replace advice given to you by your health care provider. Make sure you discuss any questions you have with your health care provider. Document Revised: 07/25/2018  Document Reviewed: 12/06/2017 Elsevier Patient Education  [place].

## 2020-06-02 NOTE — Anesthesia Procedure Notes (Signed)
Date/Time: 06/02/2020 7:37 AM Performed by: Orlie Dakin, CRNA Pre-anesthesia Checklist: Patient identified, Emergency Drugs available, Suction available and Patient being monitored Patient Re-evaluated:Patient Re-evaluated prior to induction Oxygen Delivery Method: Nasal cannula Induction Type: IV induction Placement Confirmation: positive ETCO2

## 2020-06-02 NOTE — Op Note (Signed)
[facility] Patient Name: Gail Henry Procedure Date: 06/02/2020 7:03 AM MRN: [id number] Date of Birth: February 22, 1946 Attending MD: Hildred Laser , MD CSN: [id number] Age: 74 Admit Type: Outpatient Procedure:                Upper GI endoscopy Indications:              Esophageal dysphagia, Gastro-esophageal reflux                            disease Providers:                Hildred Laser, MD, Caprice Kluver, Kristine L. Risa Grill, Technician, Aram Candela Referring MD:             Mitzie Na. Quillian Quince, MD Medicines:                Propofol per Anesthesia Complications:            No immediate complications. Estimated Blood Loss:     Estimated blood loss: none. Estimated blood loss                            was minimal. Procedure:                Pre-Anesthesia Assessment:                           - Prior to the procedure, a History and Physical                            was performed, and patient medications and                            allergies were reviewed. The patient's tolerance of                            previous anesthesia was also reviewed. The risks                            and benefits of the procedure and the sedation                            options and risks were discussed with the patient.                            All questions were answered, and informed consent                            was obtained. Prior Anticoagulants: The patient has                            taken no previous anticoagulant or antiplatelet                            agents except  for aspirin. ASA Grade Assessment: II                            - A patient with mild systemic disease. After                            reviewing the risks and benefits, the patient was                            deemed in satisfactory condition to undergo the                            procedure.                           After obtaining informed consent, the endoscope was                             passed under direct vision. Throughout the                            procedure, the patient's blood pressure, pulse, and                            oxygen saturations were monitored continuously. The                            [id number]-H190 ([id number]) scope was introduced through the                            mouth, and advanced to the second part of duodenum.                            The upper GI endoscopy was accomplished without                            difficulty. The patient tolerated the procedure                            well. Scope In: 7:32:23 AM Scope Out: 7:40:43 AM Total Procedure Duration: 0 hours 8 minutes 20 seconds  Findings:      The hypopharynx was normal.      The examined esophagus was normal.      The Z-line was irregular and was found 37 cm from the incisors.      A 2 cm hiatal hernia was present.      No endoscopic abnormality was evident in the esophagus to explain the       patient's complaint of dysphagia. It was decided, however, to proceed       with dilation of the entire esophagus. The scope was withdrawn. Dilation       was performed with a Maloney dilator with mild resistance at 48 Fr. The       dilation site was examined following endoscope reinsertion and showed no       bleeding, mucosal tear or perforation but  small linear ecchymosis noted       at UES.      There is no endoscopic evidence of any significant findings in the mid       esophagus. Five biopsies were obtained in the mid esophagus with cold       forceps for evaluation of eosinophilic esophagitis.      The entire examined stomach was normal.      The duodenal bulb and second portion of the duodenum were normal. Impression:               - Normal hypopharynx.                           - Normal esophagus.                           - Z-line irregular, 37 cm from the incisors.                           - 2 cm hiatal hernia.                           - No endoscopic  esophageal abnormality to explain                            patient's dysphagia. Esophagus dilated. Dilated.                           - Normal exeaminatiob of stomach and duodenum. Moderate Sedation:      Per Anesthesia Care Recommendation:           - Patient has a contact number available for                            emergencies. The signs and symptoms of potential                            delayed complications were discussed with the                            patient. Return to normal activities tomorrow.                            Written discharge instructions were provided to the                            patient.                           - Resume previous diet today.                           - Continue present medications.                           - No aspirin, ibuprofen, naproxen, or other  non-steroidal anti-inflammatory drugs.                           - Await pathology results.                           - Repeat upper endoscopy. Procedure Code(s):        --- Professional ---                           [phone], Esophagogastroduodenoscopy, flexible,                            transoral; with biopsy, single or multiple                           43450, Dilation of esophagus, by unguided sound or                            bougie, single or multiple passes Diagnosis Code(s):        --- Professional ---                           K22.8, Other specified diseases of esophagus                           K44.9, Diaphragmatic hernia without obstruction or                            gangrene                           R13.14, Dysphagia, pharyngoesophageal phase                           K21.9, Gastro-esophageal reflux disease without                            esophagitis CPT copyright 2019 American Medical Association. All rights reserved. The codes documented in this report are preliminary and upon coder review may  be revised to meet current compliance  requirements. Hildred Laser, MD Hildred Laser, MD 06/02/2020 8:14:59 AM This report has been signed electronically. Number of Addenda: 0

## 2020-06-03 LAB — SURGICAL PATHOLOGY

## 2020-06-07 ENCOUNTER — Encounter (HOSPITAL_COMMUNITY): Payer: Self-pay | Admitting: Internal Medicine

## 2020-06-07 ENCOUNTER — Other Ambulatory Visit (INDEPENDENT_AMBULATORY_CARE_PROVIDER_SITE_OTHER): Payer: Self-pay | Admitting: Internal Medicine

## 2020-06-07 MED ORDER — BUDESONIDE 3 MG PO CPEP: 9.0000 mg | ORAL_CAPSULE | Freq: Every day | ORAL | 0 refills | Status: DC

## 2020-08-31 ENCOUNTER — Encounter (INDEPENDENT_AMBULATORY_CARE_PROVIDER_SITE_OTHER): Payer: Self-pay

## 2020-10-13 ENCOUNTER — Other Ambulatory Visit (INDEPENDENT_AMBULATORY_CARE_PROVIDER_SITE_OTHER): Payer: Self-pay | Admitting: Internal Medicine

## 2020-10-26 ENCOUNTER — Ambulatory Visit (INDEPENDENT_AMBULATORY_CARE_PROVIDER_SITE_OTHER): Payer: Medicare Other | Admitting: Internal Medicine

## 2020-10-26 ENCOUNTER — Other Ambulatory Visit (INDEPENDENT_AMBULATORY_CARE_PROVIDER_SITE_OTHER): Payer: Self-pay | Admitting: *Deleted

## 2020-10-26 ENCOUNTER — Other Ambulatory Visit: Payer: Self-pay

## 2020-10-26 ENCOUNTER — Encounter (INDEPENDENT_AMBULATORY_CARE_PROVIDER_SITE_OTHER): Payer: Self-pay | Admitting: Internal Medicine

## 2020-10-26 VITALS — BP 107/69 | HR 78 | Temp 98.5°F | Ht 66.5 in | Wt 170.9 lb

## 2020-10-26 DIAGNOSIS — K219 Gastro-esophageal reflux disease without esophagitis: Secondary | ICD-10-CM | POA: Diagnosis not present

## 2020-10-26 DIAGNOSIS — R7989 Other specified abnormal findings of blood chemistry: Secondary | ICD-10-CM

## 2020-10-26 DIAGNOSIS — E559 Vitamin D deficiency, unspecified: Secondary | ICD-10-CM

## 2020-10-26 DIAGNOSIS — M8580 Other specified disorders of bone density and structure, unspecified site: Secondary | ICD-10-CM | POA: Diagnosis not present

## 2020-10-26 DIAGNOSIS — K5080 Crohn's disease of both small and large intestine without complications: Secondary | ICD-10-CM | POA: Diagnosis not present

## 2020-10-26 NOTE — Progress Notes (Addendum)
Presenting complaint;  Follow-up for Crohn's disease and GERD.  Database and subjective:  Patient is 75 year old Caucasian female who has history of small and large bowel Crohn's disease maintained on oral mesalamine as well as history of GERD who underwent EGD with dilation for dysphagia and surveillance colonoscopy in November 2021.  EGD revealed small sliding hiatal hernia but no structural abnormality noted to her esophagus and esophageal biopsy was negative for you eosinophilic esophagitis.  Colonoscopy revealed patent ileocolonic anastomosis.  There was active disease focally in the region of ileal colonic anastomosis.  She had scattered diverticula at transverse and sigmoid colon as well as anal papillae and external hemorrhoids.  Biopsy confirmed active disease.  She was treated with budesonide which she finished in January this year.  Patient states she could not tell any difference as far as Crohn's disease is concerned.  She is still having intermittent discomfort in right lower quadrant.  However she is not experiencing rectal bleeding nausea or vomiting.  On most days she has 1-2 stools and these are formed.  Occasionally she may have as many as 3-4 and then she may have a loose stool.  She denies fever or chills.  She says heartburn is well controlled with rebuffed resolved.  She has heartburn only with certain food.  She states her swallowing has improved great deal since esophageal dilation. She complains of pain in left leg and hip which occurs at night and she has tosses and turns in bed and not able to sleep well.  She denies paresthesias or weakness in lower extremities. Her appetite is good good.  She has gained 4 pounds since her last visit.  She is presently working part-time.  She works 20 hours a week.  She walks for 1 hour every day.  She is starting gym this week along with her granddaughter.  Current Medications: Outpatient Encounter Medications as of 10/26/2020  Medication  Sig   acetaminophen (TYLENOL) 500 MG tablet Take 500 mg by mouth every 6 (six) hours as needed for moderate pain or headache.   Calcium-Vitamin D-Vitamin K (VIACTIV PO) Take 1 tablet by mouth 3 (three) times daily.   Carboxymethylcellul-Glycerin (REFRESH RELIEVA OP) Place 1-2 drops into both eyes 3 (three) times daily as needed (dry eyes).    Cholecalciferol (VITAMIN D3) 2000 UNITS TABS Take 2,000 Units by mouth daily.   folic acid (FOLVITE) 920 MCG tablet Take 400 mcg by mouth daily.   mesalamine (PENTASA) 500 MG CR capsule Take 2 capsules (1,000 mg total) by mouth 4 (four) times daily.   Multiple Vitamins-Calcium (VIACTIV MULTI-VITAMIN) CHEW Chew 2 tablets by mouth daily.   RABEprazole (ACIPHEX) 20 MG tablet TAKE 1 TABLET BY MOUTH EVERY DAY BEFORE BREAKFAST   sodium chloride (OCEAN) 0.65 % SOLN nasal spray Place 1 spray into both nostrils daily.   tretinoin (RETIN-A) 0.025 % cream Apply 1 application topically at bedtime as needed (acne).    [DISCONTINUED] budesonide (ENTOCORT EC) 3 MG 24 hr capsule Take 3 capsules (9 mg total) by mouth daily. (Patient not taking: Reported on 10/26/2020)   No facility-administered encounter medications on file as of 10/26/2020.     Objective: Blood pressure 107/69, pulse 78, temperature 98.5 F (36.9 C), temperature source Oral, height 5' 6.5" (1.689 m), weight 170 lb 14.4 oz (77.5 kg). Patient is alert and in no acute distress. Patient is wearing a mask. Conjunctiva is pink. Sclera is nonicteric Oropharyngeal mucosa is normal. No neck masses or thyromegaly noted. Cardiac exam with  regular rhythm normal S1 and S2.  Faint systolic murmur noted at aortic area. Lungs are clear to auscultation. Abdomen is symmetrical with long midline scar and horizontal scar in right mid abdomen.  Bowel sounds are normal.  On palpation abdomen is soft.  She remains fullness and mild tenderness in the region of right lower quadrant just below the horizontal scar.  No  organomegaly or masses. No LE edema or clubbing noted.  Labs/studies Results: Lab data from 08/23/2020 WBC 5.1 H&H 13.4 and 39.5.  Platelet count 292K CRP 4(0-10) Glucose 96, BUN 14 and creatinine 0.76 Serum calcium 9.4 Bilirubin 0.4, AP 102, AST 22, ALT 17 total protein 6.6 and albumin 4.2.  Assessment:  #1.  Crohn's disease.  Colonoscopy in November 2021 revealed focal active disease at ileocolonic anastomosis.  She was treated with 6 weeks of the desonide but she did not notice any symptomatic improvement in abdominal pain which is not experienced daily.  Diarrhea is infrequent.  At best her symptoms would appear to be mild.  No indication for escalating therapy given her symptoms.  Will continue to monitor change in symptoms.    #2.  Chronic GERD.  She is doing well with therapy.  #3.  History of vitamin D deficiency.  Will check level.  #4.  Esophageal dysphagia.  She has responded to esophageal dilation even though no structural abnormality was noted.   Plan:  We will check CRP and fecal calprotectin. Check vitamin D2 level. Patient will continue Pentasa/mesalamine at current dose of 1 g 4 times a day. Office visit in 6 months.  Addendum: Assessment note should read budesonide instead of desonide.

## 2020-10-26 NOTE — Patient Instructions (Signed)
Physician will call with results of blood test and stool test when completed.

## 2020-10-27 LAB — C-REACTIVE PROTEIN: CRP: 3.7 mg/L (ref <8.0)

## 2020-10-27 LAB — VITAMIN D 25 HYDROXY (VIT D DEFICIENCY, FRACTURES): Vit D, 25-Hydroxy: 48 ng/mL (ref 30–100)

## 2020-11-01 LAB — CALPROTECTIN: Calprotectin: 156 mcg/g — ABNORMAL HIGH

## 2020-11-17 ENCOUNTER — Encounter (INDEPENDENT_AMBULATORY_CARE_PROVIDER_SITE_OTHER): Payer: Self-pay

## 2020-11-23 ENCOUNTER — Telehealth (INDEPENDENT_AMBULATORY_CARE_PROVIDER_SITE_OTHER): Payer: Self-pay

## 2020-11-23 NOTE — Telephone Encounter (Signed)
Patient is calling stating that Dr Quillian Quince did some lab test on her and she is stating that her Lupus test came back borderline positive and she understands that Crohns and lupus goes together sometimes, she states that Dr Quillian Quince will be sending her to Rheumatology and she is wondering if her medication that she takes for Crohns needs to be changed?

## 2020-11-24 NOTE — Telephone Encounter (Signed)
Positive test does not necessarily mean diagnosis of lupus. Will wait for rheumatology consultation before any changes made to her medication for Crohn's disease. Decision to change medication really depends on her symptoms and her disease irrespective of whether she has autoimmune disease or not.  Please call patient.

## 2020-11-24 NOTE — Telephone Encounter (Signed)
Gail Henry is aware and understands and will let us know what the Rheumatologist has to say

## 2021-01-16 ENCOUNTER — Telehealth (INDEPENDENT_AMBULATORY_CARE_PROVIDER_SITE_OTHER): Payer: Self-pay | Admitting: Internal Medicine

## 2021-01-16 NOTE — Telephone Encounter (Signed)
Call returned. Patient's insurance will not cover for Pentasa anymore. Patient did not answer. Will call tomorrow.

## 2021-01-27 ENCOUNTER — Telehealth (INDEPENDENT_AMBULATORY_CARE_PROVIDER_SITE_OTHER): Payer: Self-pay

## 2021-01-27 NOTE — Telephone Encounter (Signed)
Patient is aware of all and she says she will call [facility] and see if this is something she is able to use. She will call me back if not and if she wants the Asacol 400 mg Tablets 3 tablets po Bid called into her local drug store.

## 2021-01-27 NOTE — Telephone Encounter (Signed)
Per Dr. Laural Golden patient may try to reach in house pharmacy at [phone] to see if she can get her medication cheaper. This is the preferred way but if this is not something she is interested in we will switch her to Asacol 400 mg three tablets BID. If the switch is made she will need to monitor her symptoms for crohn flare.   Patient states her insurance will no longer cover her Pentasa starting 01/28/2021. She has (10 days left).

## 2021-01-28 ENCOUNTER — Other Ambulatory Visit (INDEPENDENT_AMBULATORY_CARE_PROVIDER_SITE_OTHER): Payer: Self-pay

## 2021-01-28 ENCOUNTER — Telehealth (INDEPENDENT_AMBULATORY_CARE_PROVIDER_SITE_OTHER): Payer: Self-pay | Admitting: *Deleted

## 2021-01-28 DIAGNOSIS — K5080 Crohn's disease of both small and large intestine without complications: Secondary | ICD-10-CM

## 2021-01-28 MED ORDER — MESALAMINE 400 MG PO CPDR: DELAYED_RELEASE_CAPSULE | ORAL | 11 refills | Status: DC

## 2021-01-28 NOTE — Telephone Encounter (Signed)
Spoke with Patient.

## 2021-01-28 NOTE — Telephone Encounter (Signed)
Wants Crystal to call regarding meds - [phone]

## 2021-01-28 NOTE — Telephone Encounter (Signed)
I shared this information with Dr. Laural Golden and he said to proceed with sending in the Asacol Patient aware we have sent rx to [facility] as she requested.  Patient called and states the medication would cost $344.80 for a one month supply through [place], so she wants [place] to send in the Asacol 400 mg three capsules po BID to [facility].

## 2021-02-01 ENCOUNTER — Other Ambulatory Visit (INDEPENDENT_AMBULATORY_CARE_PROVIDER_SITE_OTHER): Payer: Self-pay

## 2021-02-01 DIAGNOSIS — R899 Unspecified abnormal finding in specimens from other organs, systems and tissues: Secondary | ICD-10-CM

## 2021-02-01 NOTE — Progress Notes (Unsigned)
cap

## 2021-02-06 ENCOUNTER — Other Ambulatory Visit (INDEPENDENT_AMBULATORY_CARE_PROVIDER_SITE_OTHER): Payer: Self-pay | Admitting: Internal Medicine

## 2021-02-06 DIAGNOSIS — R197 Diarrhea, unspecified: Secondary | ICD-10-CM

## 2021-02-06 LAB — CALPROTECTIN: Calprotectin: 19 mcg/g

## 2021-02-15 LAB — GASTROINTESTINAL PATHOGEN PANEL PCR
C. difficile Tox A/B, PCR: NOT DETECTED
Campylobacter, PCR: NOT DETECTED
Cryptosporidium, PCR: NOT DETECTED
E coli (ETEC) LT/ST PCR: NOT DETECTED
E coli (STEC) stx1/stx2, PCR: NOT DETECTED
E coli 0157, PCR: NOT DETECTED
Giardia lamblia, PCR: NOT DETECTED
Norovirus, PCR: NOT DETECTED
Rotavirus A, PCR: NOT DETECTED
Salmonella, PCR: NOT DETECTED
Shigella, PCR: NOT DETECTED

## 2021-02-16 ENCOUNTER — Other Ambulatory Visit (INDEPENDENT_AMBULATORY_CARE_PROVIDER_SITE_OTHER): Payer: Self-pay

## 2021-02-16 DIAGNOSIS — R197 Diarrhea, unspecified: Secondary | ICD-10-CM

## 2021-02-16 DIAGNOSIS — K5080 Crohn's disease of both small and large intestine without complications: Secondary | ICD-10-CM

## 2021-02-18 ENCOUNTER — Other Ambulatory Visit (INDEPENDENT_AMBULATORY_CARE_PROVIDER_SITE_OTHER): Payer: Self-pay

## 2021-02-18 DIAGNOSIS — K5080 Crohn's disease of both small and large intestine without complications: Secondary | ICD-10-CM

## 2021-02-18 DIAGNOSIS — R197 Diarrhea, unspecified: Secondary | ICD-10-CM

## 2021-03-02 ENCOUNTER — Other Ambulatory Visit: Payer: Self-pay

## 2021-03-02 ENCOUNTER — Encounter (HOSPITAL_COMMUNITY): Payer: Self-pay

## 2021-03-02 ENCOUNTER — Ambulatory Visit (HOSPITAL_COMMUNITY)
Admission: RE | Admit: 2021-03-02 | Discharge: 2021-03-02 | Disposition: A | Discharge status: Home / Self Care | Payer: Medicare Other | Source: Ambulatory Visit | Attending: Internal Medicine | Admitting: Internal Medicine

## 2021-03-02 DIAGNOSIS — K5080 Crohn's disease of both small and large intestine without complications: Secondary | ICD-10-CM | POA: Insufficient documentation

## 2021-03-02 DIAGNOSIS — R197 Diarrhea, unspecified: Secondary | ICD-10-CM

## 2021-03-02 MED ORDER — GLUCAGON HCL RDNA (DIAGNOSTIC) 1 MG IJ SOLR
INTRAMUSCULAR | Status: AC
  Administered 2021-03-02: 1 mg via INTRAVENOUS
  Filled 2021-03-02: qty 1

## 2021-03-02 MED ORDER — GLUCAGON HCL RDNA (DIAGNOSTIC) 1 MG IJ SOLR: 1.0000 mg | Freq: Once | INTRAMUSCULAR | Status: AC

## 2021-03-02 MED ORDER — GADOBUTROL 1 MMOL/ML IV SOLN
7.0000 mL | Freq: Once | INTRAVENOUS | Status: AC | PRN
  Administered 2021-03-02: 7 mL via INTRAVENOUS

## 2021-03-03 ENCOUNTER — Other Ambulatory Visit (INDEPENDENT_AMBULATORY_CARE_PROVIDER_SITE_OTHER): Payer: Self-pay | Admitting: Internal Medicine

## 2021-03-03 MED ORDER — PREDNISONE 10 MG PO TABS: 30.0000 mg | ORAL_TABLET | Freq: Every day | ORAL | 0 refills | Status: DC

## 2021-03-23 ENCOUNTER — Telehealth (INDEPENDENT_AMBULATORY_CARE_PROVIDER_SITE_OTHER): Payer: Self-pay | Admitting: *Deleted

## 2021-03-23 NOTE — Telephone Encounter (Signed)
Pt called. States dr Laural Golden wanted her to give him an update when she finished prednisone. She is still taking prednisone but had to go to [facility] ER last night. States they increased prednisone to 25m for the next 5 days. She does not know how she is suppose to taper down. Was having a lot of pain abdominal distention, and nausea. Better today. No pain. Pt aware dr rLaural Goldenis out of office til tomorrow afternoon. Advised if problems between now and tomorrow afternoon to call primary care. Pt last ov with dr rLaural Goldenwas 3/29. Had mri ordered by dr rLaural Goldenon 8/4 and pt states she was put on prednisone on 8/5 for inflammation.   Pt's number [phone]

## 2021-03-24 ENCOUNTER — Other Ambulatory Visit (INDEPENDENT_AMBULATORY_CARE_PROVIDER_SITE_OTHER): Payer: Self-pay | Admitting: Internal Medicine

## 2021-03-24 DIAGNOSIS — K50018 Crohn's disease of small intestine with other complication: Secondary | ICD-10-CM

## 2021-03-24 MED ORDER — CIPROFLOXACIN HCL 500 MG PO TABS: 500.0000 mg | ORAL_TABLET | Freq: Two times a day (BID) | ORAL | 0 refills | Status: DC

## 2021-03-24 MED ORDER — METRONIDAZOLE 250 MG PO TABS: 250.0000 mg | ORAL_TABLET | Freq: Three times a day (TID) | ORAL | 0 refills | Status: DC

## 2021-03-24 NOTE — Telephone Encounter (Signed)
Called pt back today to get update before talking with Dr. Laural Golden. States she is still doing better. Needs to know how to taper off of prednisone 37m for 5 days. Today was first dose. Also wanted Dr. RLaural Goldento go over scan results. Pt notified Dr RLaural Goldento review the images and will call her back per Dr RLaural Golden   Pt's number [phone]

## 2021-03-24 NOTE — Telephone Encounter (Signed)
I reviewed CT films from [facility] compared with MRI done 3 weeks ago and CT a few years ago. She has dilated loops of small bowel.  She has small hernia containing part of the stomach which does not appear to be an issue. NP earlier this it appears to be fluid collection and not another hernia. She has dilated small bowel just proximal to anastomosis where there is narrowing and thickening and hyperenhancement of mucosa. Her symptoms are due to flareup of her Crohn's disease. She was doing so well with Pentasa until it was changed. Fecal calprotectin was nineteen 1 month ago.  Will start patient on Cipro 500 mg twice daily for 2 weeks and metronidazole to 50 mg 3 times a day for 2 weeks.  We will proceed with repeat fecal calprotectin testing, hepatitis B surface antigen, hepatitis B surface antibody  HCV antibody and when to from TB Gold plus. Patient will go to the lab tomorrow. Will send request for Humira as soon as results of these tests are available.

## 2021-03-25 ENCOUNTER — Other Ambulatory Visit (INDEPENDENT_AMBULATORY_CARE_PROVIDER_SITE_OTHER): Payer: Self-pay | Admitting: *Deleted

## 2021-03-25 DIAGNOSIS — K50019 Crohn's disease of small intestine with unspecified complications: Secondary | ICD-10-CM

## 2021-03-25 NOTE — Telephone Encounter (Signed)
I called pt and she had already spoke with Dr. Laural Golden and verbalized understanding of all.

## 2021-03-31 LAB — HEPATITIS B SURFACE ANTIBODY,QUALITATIVE: Hep B S Ab: REACTIVE — AB

## 2021-03-31 LAB — QUANTIFERON-TB GOLD PLUS
Mitogen-NIL: >10 IU/mL
NIL: 0.04 IU/mL
QuantiFERON-TB Gold Plus: NEGATIVE
TB1-NIL: 0.03 IU/mL
TB2-NIL: 0 IU/mL

## 2021-03-31 LAB — CALPROTECTIN: Calprotectin: 37 mcg/g

## 2021-03-31 LAB — HEPATITIS B SURFACE ANTIGEN: Hepatitis B Surface Ag: NONREACTIVE

## 2021-04-07 ENCOUNTER — Other Ambulatory Visit (INDEPENDENT_AMBULATORY_CARE_PROVIDER_SITE_OTHER): Payer: Self-pay

## 2021-04-07 DIAGNOSIS — K50018 Crohn's disease of small intestine with other complication: Secondary | ICD-10-CM

## 2021-04-07 DIAGNOSIS — K5080 Crohn's disease of both small and large intestine without complications: Secondary | ICD-10-CM

## 2021-04-07 DIAGNOSIS — K50019 Crohn's disease of small intestine with unspecified complications: Secondary | ICD-10-CM

## 2021-04-07 MED ORDER — HUMIRA (2 PEN) 40 MG/0.4ML ~~LOC~~ AJKT: AUTO-INJECTOR | SUBCUTANEOUS | 11 refills | Status: DC

## 2021-04-07 MED ORDER — HUMIRA (2 PEN) 80 MG/0.8ML ~~LOC~~ PNKT: PEN_INJECTOR | SUBCUTANEOUS | 0 refills | Status: DC

## 2021-04-14 ENCOUNTER — Other Ambulatory Visit (INDEPENDENT_AMBULATORY_CARE_PROVIDER_SITE_OTHER): Payer: Self-pay | Admitting: Internal Medicine

## 2021-04-19 ENCOUNTER — Ambulatory Visit (INDEPENDENT_AMBULATORY_CARE_PROVIDER_SITE_OTHER): Payer: Medicare Other | Admitting: Internal Medicine

## 2021-04-19 ENCOUNTER — Other Ambulatory Visit: Payer: Self-pay

## 2021-04-19 ENCOUNTER — Encounter (INDEPENDENT_AMBULATORY_CARE_PROVIDER_SITE_OTHER): Payer: Self-pay

## 2021-04-19 VITALS — BP 146/94 | HR 94 | Temp 99.1°F | Ht 66.5 in | Wt 172.4 lb

## 2021-04-19 DIAGNOSIS — K50018 Crohn's disease of small intestine with other complication: Secondary | ICD-10-CM

## 2021-04-19 NOTE — Progress Notes (Deleted)
Patient was observed for 30 minutes after Humira/adalimumab was administered and she experienced no skin rash or shortness of breath. I did examine her abdomen and she had fullness in mild to moderate tenderness in right lower quadrant without guarding.

## 2021-05-03 ENCOUNTER — Other Ambulatory Visit: Payer: Self-pay

## 2021-05-03 ENCOUNTER — Ambulatory Visit (INDEPENDENT_AMBULATORY_CARE_PROVIDER_SITE_OTHER): Payer: Medicare Other | Admitting: Internal Medicine

## 2021-05-03 ENCOUNTER — Encounter (INDEPENDENT_AMBULATORY_CARE_PROVIDER_SITE_OTHER): Payer: Self-pay | Admitting: Internal Medicine

## 2021-05-03 VITALS — BP 117/76 | HR 79 | Temp 98.7°F | Ht 66.5 in | Wt 173.5 lb

## 2021-05-03 DIAGNOSIS — K5000 Crohn's disease of small intestine without complications: Secondary | ICD-10-CM

## 2021-05-03 DIAGNOSIS — K219 Gastro-esophageal reflux disease without esophagitis: Secondary | ICD-10-CM

## 2021-05-03 MED ORDER — RABEPRAZOLE SODIUM 20 MG PO TBEC: 20.0000 mg | DELAYED_RELEASE_TABLET | ORAL | 3 refills | Status: DC

## 2021-05-03 NOTE — Patient Instructions (Signed)
Can take OTC famotidine on days when you do not take Aciphex/rabeprazole. Remember to take an activated flu vaccine.

## 2021-05-03 NOTE — Progress Notes (Signed)
Presenting complaint;  Follow-up for Crohn's disease and GERD.  Database and subjective:  Patient is 75 year old Caucasian female with several year history of Crohn's disease involving primarily small bowel but she also had some colonic disease with 2 remote surgeries.  Second surgery was in 1995 when she had perforation and ended up with right hemicolectomy.  Her first surgery was in late 1970s.  She has been treated with oral mesalamine's prednisone and budesonide and she was also on 6-MP which was discontinued because of bump in her transaminases. Her last colonoscopy was in November 2021 revealing patent ileocolonic anastomosis in focally active disease in the region of anastomosis.  She also had scattered diverticula at transverse and sigmoid colon along with external hemorrhoids.  Biopsy confirmed active disease.  She was treated with budesonide which she finished early part of this year.   She also has chronic GERD.  She underwent EGD with dilation also November 2021.  She had small sliding hiatal hernia and no structural abnormality to her esophagus which was nevertheless dilated.  Esophageal biopsy was negative for eosinophilic esophagitis.  She was experience diarrhea and back pain and underwent MR enterography on 03/02/2021 revealing irregular wall thickening to the neoterminal ileum with acute component.  She also had mild wall thickening to transverse colon and left colon consistent with mild colitis. She was treated with prednisone starting with 30 mg daily and dropping dose by 5 mg every week.  I felt flareup of her condition was most likely due to change in oral mesalamine prep.  She was on Pentasa which is bioavailable and small bowel and she was switched to is a call which is usually bioavailable in the colon. Patient presented to emergency room at [facility] on 03/23/2021 with worsening abdominal pain and nausea.  CT revealed ventral hernia containing small bowel and small bowel proximal to  the mildly dilated concerning for small bowel obstruction.  She also revealed evidence of prior colonic surgery with stranding in the region of ileocolonic anastomosis.  Study also revealed stones in left kidney without hydronephrosis.  Patient had NG tube placed and she was treated with IV fluids.  She improved and was discharged. Patient is advised to go back on higher dose of prednisone. She was screened for hepatitis B and TB and the studies were negative and biologic therapy was requested and Humira/adalimumab was improved. She received first dose of Humira on 04/19/2021.  She received second dose at a later and third dose this morning. She is not experience any side effects with biologic.  She has noted decrease in abdominal pain and her diarrhea has improved.  She is having 2-3 stools per day.  Consistency varies between formed and loose.  She denies melena or rectal bleeding.  She also denies nausea vomiting fever or chills.  Her appetite is good.  She is maintaining her weight. She took last dose of prednisone yesterday.  She has now noted left hip pain.  She states she did not have hip pain while she was on prednisone.  She feels heartburn is well controlled with PPI.  She is interested in trying it every other day. She does not take OTC NSAIDs.   Current Medications: Outpatient Encounter Medications as of 05/03/2021  Medication Sig   acetaminophen (TYLENOL) 500 MG tablet Take 500 mg by mouth every 6 (six) hours as needed for moderate pain or headache.   Adalimumab (HUMIRA PEN) 40 MG/0.4ML PNKT Start Humira with 80 mg Subcutaneously on day one, day two  and day fifteen. Then start 40 mg Subcutaneously on day 29 and every other week thereafter.   Adalimumab (HUMIRA PEN) 80 MG/0.8ML PNKT Start Humira with 80 mg Subcutaneously on day one, day two and day fifteen. Then start 40 mg Subcutaneously on day 29 and every other week thereafter.   Calcium-Vitamin D-Vitamin K (VIACTIV PO) Take 1 tablet by  mouth 3 (three) times daily.   Carboxymethylcellul-Glycerin (REFRESH RELIEVA OP) Place 1-2 drops into both eyes 3 (three) times daily as needed (dry eyes).    Cholecalciferol (VITAMIN D3) 2000 UNITS TABS Take 2,000 Units by mouth daily.   folic acid (FOLVITE) 379 MCG tablet Take 400 mcg by mouth daily.   Multiple Vitamins-Calcium (VIACTIV MULTI-VITAMIN) CHEW Chew 2 tablets by mouth daily.   RABEprazole (ACIPHEX) 20 MG tablet TAKE 1 TABLET BY MOUTH EVERY DAY BEFORE BREAKFAST   sodium chloride (OCEAN) 0.65 % SOLN nasal spray Place 1 spray into both nostrils daily.   [DISCONTINUED] predniSONE (DELTASONE) 10 MG tablet Take 3 tablets (30 mg total) by mouth daily with breakfast.   No facility-administered encounter medications on file as of 05/03/2021.     Objective: Blood pressure 117/76, pulse 79, temperature 98.7 F (37.1 C), temperature source Oral, height 5' 6.5" (1.689 m), weight 173 lb 8 oz (78.7 kg). Patient is alert and in no acute distress. Conjunctiva is pink. Sclera is nonicteric Oropharyngeal mucosa is normal. No neck masses or thyromegaly noted. Cardiac exam with regular rhythm normal S1 and S2. No murmur or gallop noted. Lungs are clear to auscultation. Abdomen is symmetrical.  She has worsening scar in upper midline and horizontal scar in right mid abdomen.  Bowel sounds are normal.  On palpation abdomen is soft.  She has fullness and mild tenderness in right mid abdomen without guarding or rebound.  No organomegaly or masses. No LE edema or clubbing noted.  Labs/studies Results:   CBC Latest Ref Rng & Units 04/17/2019 11/18/2015 11/03/2014  WBC 3.8 - 10.8 Thousand/uL 6.7 7.1 6.6  Hemoglobin 11.7 - 15.5 g/dL 13.5 13.4 14.2  Hematocrit 35.0 - 45.0 % 40.5 40.3 41.7  Platelets 140 - 400 Thousand/uL 284 307 333    CMP Latest Ref Rng & Units 04/17/2019 11/18/2015 02/17/2013  Glucose 65 - 99 mg/dL 87 - -  BUN 7 - 25 mg/dL 16 - -  Creatinine 0.60 - 0.93 mg/dL 0.79 0.70 -  Sodium 135  - 146 mmol/L 141 - -  Potassium 3.5 - 5.3 mmol/L 4.0 - -  Chloride 98 - 110 mmol/L 104 - -  CO2 20 - 32 mmol/L 31 - -  Calcium 8.6 - 10.4 mg/dL 9.7 - -  Total Protein 6.1 - 8.1 g/dL 6.6 - 7.3  Total Bilirubin 0.2 - 1.2 mg/dL 0.3 - 0.4  Alkaline Phos 39 - 117 U/L - - 88  AST 10 - 35 U/L 15 - 20  ALT 6 - 29 U/L 9 - 16    Hepatic Function Latest Ref Rng & Units 04/17/2019 02/17/2013 02/22/2012  Total Protein 6.1 - 8.1 g/dL 6.6 7.3 6.9  Albumin 3.5 - 5.2 g/dL - 4.1 3.9  AST 10 - 35 U/L _0 ALT 6 - 29 U/L _1 Alk Phosphatase 39 - 117 U/L - 88 96  Total Bilirubin 0.2 - 1.2 mg/dL 0.3 0.4 0.4  Bilirubin, Direct 0.0 - 0.3 mg/dL - 0.1 -    Lab Results  Component Value Date   CRP 3.7 10/26/2020  Assessment:  #1.  Flareup of Crohn's disease primarily involving neoterminal ileum.  She was bridged with prednisone and begun on Humira/adalimumab 2 weeks ago.  There appears to be symptomatic improvement.  Obviously she has not achieved full benefit of this medication.  Hopefully she will not have to go back on prednisone.  #2.  Chronic GERD.  Recent EGD was negative for erosive esophagitis or Barrett's esophagus.  She is watching her diet.  She can try taking PPI every other day.  On off days she can take OTC antacids or famotidine for breakthrough symptoms.   Plan:  She will continue Humira/adalimumab at a dose of 40 mg subcu every 14 days. Patient advised to take and activated flu vaccine since she is on biologic. She will try rabeprazole 20 mg every other day and use OTC famotidine or antacids on off days for breakthrough symptoms. Office visit in 3 months.       

## 2021-05-06 ENCOUNTER — Encounter (INDEPENDENT_AMBULATORY_CARE_PROVIDER_SITE_OTHER): Payer: Self-pay

## 2021-05-23 ENCOUNTER — Telehealth (INDEPENDENT_AMBULATORY_CARE_PROVIDER_SITE_OTHER): Payer: Self-pay | Admitting: *Deleted

## 2021-05-23 NOTE — Telephone Encounter (Signed)
Pt called states when she took humira on the 11th it left a large red area that was itchy. It went away after 2 days. I advised pt to just call back if it happened again. She also wanted to know if she should take covid booster while on humira.

## 2021-05-24 NOTE — Telephone Encounter (Signed)
Per dr Laural Golden - hold off on getting covid booster until he knows how she is doing with humira. Call back in 4 weeks and give dr Laural Golden an update.

## 2021-06-27 ENCOUNTER — Other Ambulatory Visit (INDEPENDENT_AMBULATORY_CARE_PROVIDER_SITE_OTHER): Payer: Self-pay | Admitting: Internal Medicine

## 2021-06-27 ENCOUNTER — Telehealth (INDEPENDENT_AMBULATORY_CARE_PROVIDER_SITE_OTHER): Payer: Self-pay | Admitting: *Deleted

## 2021-06-27 MED ORDER — LIDOCAINE VISCOUS HCL 2 % MT SOLN: 5.0000 mL | Freq: Four times a day (QID) | OROMUCOSAL | 1 refills | Status: DC

## 2021-06-27 NOTE — Telephone Encounter (Signed)
Pt is due for humira shot tomorrow. She started having fever and watery stools 4 days ago. States temp of 100 for 2 days then went to 99 for 2 days then no fever yesterday or today. Watery stools all day on the 23rd and 24th, then loose on the 25th, no stool on the 26th and yesterday and today had formed stools. Trying to eat solid foods but mouth is burning, tongue is white and inside of mouth is red. States she has used magic mouth wash in the past. Would like to see if she can get some more and if its ok to take humira shot tomorrow since no fever in 2 days.   [facility]  [phone]

## 2021-06-27 NOTE — Telephone Encounter (Signed)
Per dr Laural Golden - wait two more days before taking humira and magic mouth wash was sent to pharm.  Pt called and notified to wait two more days on humira and magic mouth was was sent to pharm. Pt verbalized understanding.

## 2021-07-11 ENCOUNTER — Telehealth (INDEPENDENT_AMBULATORY_CARE_PROVIDER_SITE_OTHER): Payer: Self-pay | Admitting: *Deleted

## 2021-07-11 NOTE — Telephone Encounter (Signed)
Did have white patches before starting mouthwash now gone and just red, had sore throat before mouthwash but that is also better now.

## 2021-07-11 NOTE — Telephone Encounter (Signed)
Pt seen 05/03/21. States she has had issues in the past with her mouth burning and being red. She thinks this is related to her crohn's. She just finished magic mouth wash dr Laural Golden sent in 2 days ago. States mouth is some better but usually after finishing mouthwash it is cleared up. She has red spots on tongue, gums are red, mouth burns when eating and has lossed her sense of taste, states she has had dry bumps in her scalp in the past as well but it seems worse this time. Not itching, started humira in October.   [phone]

## 2021-07-12 NOTE — Telephone Encounter (Signed)
error 

## 2021-07-12 NOTE — Telephone Encounter (Signed)
She is having burning pain in mouth

## 2021-07-13 NOTE — Telephone Encounter (Signed)
Pt wanted to follow up on message. States her scalp is getting really bad, and her mouth is about the same, not worse and not any better from her last call.

## 2021-07-13 NOTE — Telephone Encounter (Signed)
Pt called back and she made an appt with her pcp today at 4pm for issues she is having.

## 2021-08-08 ENCOUNTER — Telehealth (INDEPENDENT_AMBULATORY_CARE_PROVIDER_SITE_OTHER): Payer: Self-pay | Admitting: *Deleted

## 2021-08-08 NOTE — Telephone Encounter (Signed)
Patient states she is still having problems with bright red spots in mouth and tongue burns when eating anything spicy and salty. Gums red. Some better since using magic mouthwash. Takes 4 times a day. Swish and swallow. On 4th bottle of magic mouth wash. Has appt on 1/24. Wonders if coming from [place]. Started med on 04/19/21  [facility].   Pt's number [phone].

## 2021-08-09 ENCOUNTER — Other Ambulatory Visit (INDEPENDENT_AMBULATORY_CARE_PROVIDER_SITE_OTHER): Payer: Self-pay | Admitting: Internal Medicine

## 2021-08-09 NOTE — Telephone Encounter (Signed)
Per dr Laural Golden may stop humira and will discuss options on visit jan 24th

## 2021-08-23 ENCOUNTER — Other Ambulatory Visit: Payer: Self-pay

## 2021-08-23 ENCOUNTER — Ambulatory Visit (INDEPENDENT_AMBULATORY_CARE_PROVIDER_SITE_OTHER): Payer: Medicare Other | Admitting: Internal Medicine

## 2021-08-23 ENCOUNTER — Encounter (INDEPENDENT_AMBULATORY_CARE_PROVIDER_SITE_OTHER): Payer: Self-pay | Admitting: Internal Medicine

## 2021-08-23 VITALS — BP 126/75 | HR 77 | Temp 98.2°F | Ht 66.5 in | Wt 172.8 lb

## 2021-08-23 DIAGNOSIS — K219 Gastro-esophageal reflux disease without esophagitis: Secondary | ICD-10-CM | POA: Diagnosis not present

## 2021-08-23 DIAGNOSIS — K50019 Crohn's disease of small intestine with unspecified complications: Secondary | ICD-10-CM

## 2021-08-23 NOTE — Progress Notes (Signed)
Presenting complaint;  Follow-up for Crohn's disease  Database and subjective:  Patient is 76 year old Caucasian female with over 40-year history of Crohn's disease with for surgery in late 1970s and second surgery 1995 when she had right hemicolectomy.  She has been maintained on 6-MP for years but was discontinued because of transaminitis.  She has been maintained on oral mesalamine(Pentasa) with sporadic treatment with prednisone and/or budesonide. Last surveillance colonoscopy was in November 2021 revealing patent ileocolonic anastomosis and focally active disease in the region of anastomosis.  Biopsy confirmed active disease.  She was treated with budesonide for about 6 weeks. She developed diarrhea and back pain and MR enterography revealed active disease and neoterminal ileum.  She also have mild wall thickening to transverse and left colon.  She was treated with prednisone 30 mg daily with weekly taper of 5 mg. She developed worsening abdominal pain on 03/23/2021 and was seen in emergency room at [facility].  CT revealed ventral hernia containing small bowel with proximal dilation concerning for small bowel obstruction.  Patient was treated with NG tube and IV fluids she improved and was discharged on high-dose of prednisone. Hepatitis B surface antigen and QuantiFERON TB Gold plus were negative.  She was begun on Humira.  Induction therapy was on 04/19/2021, 04/20/2021 and 05/03/2021.  Thereafter she has been on 40 mg every 14 days.  Patient noted resolution of abdominal pain. Last month she developed fever blisters in her mouth as well as burning on her tongue.  She was treated with Magic mouthwash.  She she felt it 4 times but her symptoms did not improve.  She also developed flareup of seborrheic dermatitis not responding to medications that previously had works.  Therefore we asked her not to take any more Humira.  Last dose was on 07/28/2021. She says overall symptoms are better but she still having  issues with skin rash involving scalp.  She has been using shampoo for over a month but she cannot tell any difference.  She has an appointment with her dermatologist Dr. Denna Haggard in near future. Her appetite is good.  She has not lost any weight.  She is having 2-3 stools per day.  Stool consistency varies between formed to loose.  She denies melena or rectal bleeding.  She denies fever chills nausea or vomiting.  She is on PPI every other day and if it is working.  She reports her arthritis improved while she was on Humira.  She has been [facility] rheumatology and felt not to have rheumatoid arthritis.    Current Medications: Outpatient Encounter Medications as of 08/23/2021  Medication Sig   acetaminophen (TYLENOL) 500 MG tablet Take 500 mg by mouth every 6 (six) hours as needed for moderate pain or headache.   Calcium-Vitamin D-Vitamin K (VIACTIV PO) Take 1 tablet by mouth 3 (three) times daily.   Carboxymethylcellul-Glycerin (REFRESH RELIEVA OP) Place 1-2 drops into both eyes 3 (three) times daily as needed (dry eyes).    Cholecalciferol (VITAMIN D3) 2000 UNITS TABS Take 2,000 Units by mouth daily.   Clobetasol Propionate 0.05 % shampoo Apply topically daily.   folic acid (FOLVITE) 300 MCG tablet Take 400 mcg by mouth daily.   Multiple Vitamins-Calcium (VIACTIV MULTI-VITAMIN) CHEW Chew 2 tablets by mouth daily.   RABEprazole (ACIPHEX) 20 MG tablet Take 1 tablet (20 mg total) by mouth every other day.   sodium chloride (OCEAN) 0.65 % SOLN nasal spray Place 1 spray into both nostrils daily.   [DISCONTINUED] Adalimumab (  HUMIRA PEN) 40 MG/0.4ML PNKT Start Humira with 80 mg Subcutaneously on day one, day two and day fifteen. Then start 40 mg Subcutaneously on day 29 and every other week thereafter. (Patient not taking: Reported on 08/23/2021)   [DISCONTINUED] Adalimumab (HUMIRA PEN) 80 MG/0.8ML PNKT Start Humira with 80 mg Subcutaneously on day one, day two and day fifteen. Then start 40 mg  Subcutaneously on day 29 and every other week thereafter. (Patient not taking: Reported on 08/23/2021)   [DISCONTINUED] magic mouthwash (lidocaine, diphenhydrAMINE, alum & mag hydroxide) suspension Swish and swallow 5 mLs 4 (four) times daily.   No facility-administered encounter medications on file as of 08/23/2021.     Objective: Blood pressure 126/75, pulse 77, temperature 98.2 F (36.8 C), temperature source Oral, height 5' 6.5" (1.689 m), weight 172 lb 12.8 oz (78.4 kg). Patient is alert and in no acute distress. Conjunctiva is pink. Sclera is nonicteric Oropharyngeal mucosa is normal. No neck masses or thyromegaly noted. Cardiac exam with regular rhythm normal S1 and S2. No murmur or gallop noted. Lungs are clear to auscultation. Abdomen abdomen is symmetrical.  She has long midline scar along with horizontal scar in right lower quadrant.  Bowel sounds are normal.  On palpation abdomen is soft.  She has mild tenderness in both iliac fossae.  Fullness noted in right lower quadrant which is not as pronounced as it has been before. No LE edema or clubbing noted.  Labs/studies Results:   CBC Latest Ref Rng & Units 04/17/2019 11/18/2015 11/03/2014  WBC 3.8 - 10.8 Thousand/uL 6.7 7.1 6.6  Hemoglobin 11.7 - 15.5 g/dL 13.5 13.4 14.2  Hematocrit 35.0 - 45.0 % 40.5 40.3 41.7  Platelets 140 - 400 Thousand/uL 284 307 333    CMP Latest Ref Rng & Units 04/17/2019 11/18/2015 02/17/2013  Glucose 65 - 99 mg/dL 87 - -  BUN 7 - 25 mg/dL 16 - -  Creatinine 0.60 - 0.93 mg/dL 0.79 0.70 -  Sodium 135 - 146 mmol/L 141 - -  Potassium 3.5 - 5.3 mmol/L 4.0 - -  Chloride 98 - 110 mmol/L 104 - -  CO2 20 - 32 mmol/L 31 - -  Calcium 8.6 - 10.4 mg/dL 9.7 - -  Total Protein 6.1 - 8.1 g/dL 6.6 - 7.3  Total Bilirubin 0.2 - 1.2 mg/dL 0.3 - 0.4  Alkaline Phos 39 - 117 U/L - - 88  AST 10 - 35 U/L 15 - 20  ALT 6 - 29 U/L 9 - 16    Hepatic Function Latest Ref Rng & Units 04/17/2019 02/17/2013 02/22/2012  Total Protein  6.1 - 8.1 g/dL 6.6 7.3 6.9  Albumin 3.5 - 5.2 g/dL - 4.1 3.9  AST 10 - 35 U/L _0 ALT 6 - 29 U/L _1 Alk Phosphatase 39 - 117 U/L - 88 96  Total Bilirubin 0.2 - 1.2 mg/dL 0.3 0.4 0.4  Bilirubin, Direct 0.0 - 0.3 mg/dL - 0.1 -    Lab Results  Component Value Date   CRP 3.7 10/26/2020     Assessment:  #1.  Crohn's disease predominantly involving small bowel.  Disease duration 40 years.  She had flareup about 5 months ago and responded to Humira/adalimumab which was stopped after 13 weeks because of worsening seborrheic dermatitis and oral symptoms.  She will need another agent and since her symptoms are not severe and TVO/we are losing Mab would be the next step.  #2.  Chronic GERD.  She is  doing well with PPI every other day.  Plan:  Request approval for Entyvio/with looser mouth both for induction and maintenance of Crohn's disease.  Dose as follows 300 mg IV at weeks 0, 2 and 6 and thereafter 300 mg IV every 8 hours. CBC with differential, comprehensive chemistry panel and fecal calprotectin. Patient will call office if abdominal pain worsens. Office visit in 3 months.

## 2021-08-23 NOTE — Patient Instructions (Signed)
Notify if abdominal pain worsens. Will start induction therapy with Entyvio/vedolizumab as soon as it is approved.

## 2021-08-24 ENCOUNTER — Telehealth (INDEPENDENT_AMBULATORY_CARE_PROVIDER_SITE_OTHER): Payer: Self-pay | Admitting: *Deleted

## 2021-08-24 ENCOUNTER — Other Ambulatory Visit (INDEPENDENT_AMBULATORY_CARE_PROVIDER_SITE_OTHER): Payer: Self-pay | Admitting: *Deleted

## 2021-08-24 NOTE — Telephone Encounter (Signed)
Dr. Laural Golden wants to start patient on entyvio. I called her insurance medicare and spoke with [employer]. Reference number [id number]. No auth required for J code [id number] And CPT codes 35465 and [id number]. Called secondary insurance [employer] and they sent me forms to fill out. Forms filled out. Waiting for dr Olevia Perches signature and OV note from yesterday so I can fax back.

## 2021-08-25 NOTE — Telephone Encounter (Signed)
Dr Laural Golden did sign forms. Waiting for ov note to be completed to fax with forms.

## 2021-08-29 NOTE — Telephone Encounter (Signed)
Note completed. I have given the chart to Lelon Frohlich to pull the note and the last labs from 2022, so we may fax with the authorization.

## 2021-08-30 NOTE — Telephone Encounter (Signed)
Called pt to let her know we are working on prior [place] and info was submitted to insurance. She asked about a savings card like she had with humira. I let her know I would have to look into that and get back with her.

## 2021-08-30 NOTE — Telephone Encounter (Signed)
Forms faxed. Await decision from insurance.

## 2021-09-02 NOTE — Telephone Encounter (Signed)
Entivyo approved through 08/30/21 -11/28/21. Order filled out for [facility] and faxed. Pt notified she should get a call from [facility] and to give [place] a call if she does not hear back.

## 2021-09-07 NOTE — Telephone Encounter (Signed)
Hoyle Sauer called back and scheduled pt on 09/15/21 at 8 am. Register 7:45am. Called pt and she verbalized understanding. Pt emailed me forms for assistance with entyvio. Crystal filled out forms and I will have dr Laural Golden sign when back in office. Pt is aware of appt and that we are working on forms.

## 2021-09-07 NOTE — Telephone Encounter (Signed)
Left message to return call with carolyn at [facility] [phone]

## 2021-09-09 NOTE — Telephone Encounter (Signed)
Forms signed by dr Laural Golden and I faxed this morning and left pt a copy of forms at front window. Pt was notified.

## 2021-09-12 ENCOUNTER — Encounter: Payer: Self-pay | Admitting: Dermatology

## 2021-09-12 ENCOUNTER — Other Ambulatory Visit: Payer: Self-pay

## 2021-09-12 ENCOUNTER — Ambulatory Visit (INDEPENDENT_AMBULATORY_CARE_PROVIDER_SITE_OTHER): Payer: Medicare Other | Admitting: Dermatology

## 2021-09-12 DIAGNOSIS — L308 Other specified dermatitis: Secondary | ICD-10-CM | POA: Diagnosis not present

## 2021-09-12 MED ORDER — CLOBETASOL PROPIONATE 0.05 % EX FOAM: Freq: Two times a day (BID) | CUTANEOUS | 2 refills | Status: DC

## 2021-09-12 NOTE — Patient Instructions (Addendum)
CALL OR MESSAGE ON MYCHART AND UPDATE [place] ON THE PROGRESS OF YOUR SCALP IN ABOUT 2 MONTHS AND IF YOU NEED A CHANGE OF SCRIPT OR CREAM FOR THE FACE PLEASE.

## 2021-09-12 NOTE — Progress Notes (Signed)
° °  Follow-Up Visit   Subjective  Gail Henry is a 76 y.o. female who presents for the following: Hair/Scalp Problem (Pt states she is having a scalp issue and thinks its psoriasis. Pt states she was on humira and is off now because of side effects.  Has also used clobetasol shampoo in past, now currently using. Pt is currently on fluocinonide 0.05 % topical solution for her scalp./      ///      //).  Psoriasis-like rash on scalp Location:  Duration:  Quality:  Associated Signs/Symptoms: Modifying Factors:  Severity:  Timing: Context:   Objective  Well appearing patient in no apparent distress; mood and affect are within normal limits. Scalp History is that she had acute knee pain which was initially treated with a course of prednisone with transient improvement.  When this recurred along with GI symptoms (history of last Crohn's flare 20 years ago) she was begun on Humira with subsequent involvement of red spots and a very sore mouth and scaly spots on the scalp.  The doctor mention that Humira can paradoxically produce a psoriasis type or option so this was discontinued with incomplete but good improvement in her mouth but persistence of scaling on her scalp.  Examination showed 1-2 sonometer psoriasiform patches scattered throughout scalp.  No changes ears, central face, elbows, nails, hand joints.  The differential here is an endogenous autoimmune process like psoriasis or eczema versus a persistent drug-induced causation.  I know of no way of absolutely distinguish thing these and I think that biopsy at present would be a low yield procedure    A focused examination was performed including head, neck, arms, nails, joints. Relevant physical exam findings are noted in the Assessment and Plan.   Assessment & Plan    Psoriasiform dermatitis Scalp  She will initially try clobetasol foam daily for her scalp for up to 4 weeks.  Avoid use on face and body folds.  If there is  excellent improvement she may choose to cancel her follow-up visit.  Related Medications clobetasol (OLUX) 0.05 % topical foam Apply topically 2 (two) times daily.      I, Lavonna Monarch, MD, have reviewed all documentation for this visit.  The documentation on 09/12/21 for the exam, diagnosis, procedures, and orders are all accurate and complete.

## 2021-09-15 ENCOUNTER — Encounter (HOSPITAL_COMMUNITY)
Admission: RE | Admit: 2021-09-15 | Discharge: 2021-09-15 | Disposition: A | Discharge status: Home / Self Care | Payer: Medicare Other | Source: Ambulatory Visit | Attending: Internal Medicine | Admitting: Internal Medicine

## 2021-09-15 DIAGNOSIS — K5080 Crohn's disease of both small and large intestine without complications: Secondary | ICD-10-CM | POA: Diagnosis not present

## 2021-09-15 DIAGNOSIS — K5090 Crohn's disease, unspecified, without complications: Secondary | ICD-10-CM | POA: Diagnosis present

## 2021-09-15 MED ORDER — VEDOLIZUMAB 300 MG IV SOLR
300.0000 mg | Freq: Once | INTRAVENOUS | Status: AC
  Administered 2021-09-15: 300 mg via INTRAVENOUS
  Filled 2021-09-15: qty 5

## 2021-09-15 MED ORDER — SODIUM CHLORIDE 0.9 % IV SOLN: Freq: Once | INTRAVENOUS | Status: AC

## 2021-09-29 ENCOUNTER — Encounter (HOSPITAL_COMMUNITY)
Admission: RE | Admit: 2021-09-29 | Discharge: 2021-09-29 | Disposition: A | Discharge status: Home / Self Care | Payer: Medicare Other | Source: Ambulatory Visit | Attending: Internal Medicine | Admitting: Internal Medicine

## 2021-09-29 ENCOUNTER — Encounter (HOSPITAL_COMMUNITY): Payer: Self-pay

## 2021-09-29 DIAGNOSIS — K5080 Crohn's disease of both small and large intestine without complications: Secondary | ICD-10-CM | POA: Diagnosis not present

## 2021-09-29 DIAGNOSIS — K5090 Crohn's disease, unspecified, without complications: Secondary | ICD-10-CM | POA: Diagnosis present

## 2021-09-29 MED ORDER — SODIUM CHLORIDE 0.9 % IV SOLN: INTRAVENOUS | Status: DC

## 2021-09-29 MED ORDER — VEDOLIZUMAB 300 MG IV SOLR
300.0000 mg | Freq: Once | INTRAVENOUS | Status: AC
  Administered 2021-09-29: 300 mg via INTRAVENOUS
  Filled 2021-09-29: qty 5

## 2021-10-27 ENCOUNTER — Encounter (HOSPITAL_COMMUNITY): Payer: Self-pay

## 2021-10-27 ENCOUNTER — Encounter (HOSPITAL_COMMUNITY)
Admission: RE | Admit: 2021-10-27 | Discharge: 2021-10-27 | Disposition: A | Discharge status: Home / Self Care | Payer: Medicare Other | Source: Ambulatory Visit | Attending: Internal Medicine | Admitting: Internal Medicine

## 2021-10-27 DIAGNOSIS — K5080 Crohn's disease of both small and large intestine without complications: Secondary | ICD-10-CM | POA: Diagnosis not present

## 2021-10-27 MED ORDER — SODIUM CHLORIDE 0.9 % IV SOLN
Freq: Once | INTRAVENOUS | Status: AC
  Administered 2021-10-27: 250 mL via INTRAVENOUS

## 2021-10-27 MED ORDER — SODIUM CHLORIDE 0.9 % IV SOLN
300.0000 mg | Freq: Once | INTRAVENOUS | Status: AC
  Administered 2021-10-27: 300 mg via INTRAVENOUS
  Filled 2021-10-27: qty 5

## 2021-10-31 ENCOUNTER — Other Ambulatory Visit (INDEPENDENT_AMBULATORY_CARE_PROVIDER_SITE_OTHER): Payer: Self-pay | Admitting: Gastroenterology

## 2021-10-31 DIAGNOSIS — K50019 Crohn's disease of small intestine with unspecified complications: Secondary | ICD-10-CM

## 2021-10-31 DIAGNOSIS — K5000 Crohn's disease of small intestine without complications: Secondary | ICD-10-CM | POA: Insufficient documentation

## 2021-11-01 ENCOUNTER — Telehealth: Payer: Self-pay | Admitting: Pharmacy Technician

## 2021-11-01 NOTE — Telephone Encounter (Addendum)
Dr. Jenetta Downer ?Fyi note: ? ?Auth Submission: NO AUTH NEEDED ?Payer: MEDICARE A/B (primary) ?Medication & CPT/J Code(s) submitted: Entyvio (Vedolizumab) [id number] ?Route of submission (phone, fax, portal): PHONE: [phone] ?Auth type: Buy/Bill ?Units/visits requested: 300 MG Q28DAYS ?Reference number:  ?Approval from: 10/31/21 - 11/01/21 ? ?Auth Submission: APPROVED ?Payer: [place] (SECONDARY) ?Medication & CPT/J Code(s) submitted: Entyvio (Vedolizumab) [id number] ?Route of submission (phone, fax, portal): PHONE: [phone] ?FAX: [phone] ?Auth type: Buy/Bill ?Units/visits requested: 318m q28 days ?Reference number: [id number]?Approval:  11/01/21 - 10/31/22 ? ?Patient will be scheduled as soon as possible.  ?

## 2021-11-03 IMAGING — MR MR [PERSON_NAME] ABD W/CM
25 of 26 series · 45 of 48 positions shown · IV contrast (gadavist)
Comparison: Abdomen and pelvis evaluation from 0848. No recent CT
evaluation.

CLINICAL DATA: Diarrhea and back pain, concern for Crohn's disease.

EXAM:
MR ABDOMEN AND PELVIS WITHOUT AND WITH CONTRAST (MR ENTEROGRAPHY)
TECHNIQUE: Multiplanar, multisequence MRI of the abdomen and pelvis was
performed both before and during bolus administration of intravenous
contrast. Negative oral contrast VoLumen was given.
CONTRAST:  7mL GADAVIST GADOBUTROL 1 MMOL/ML IV SOLN

[Series 4: cor haste · coronal · 6.0mm · 1.56mm/px · 1 of 30 slices shown]
[im 1/30]
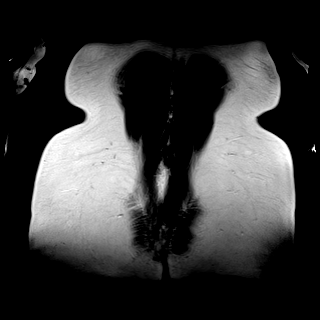

[Series 5: ax haste · axial · 6.0mm · 1.19mm/px · 1 of 57 slices shown]
[im 1/57]
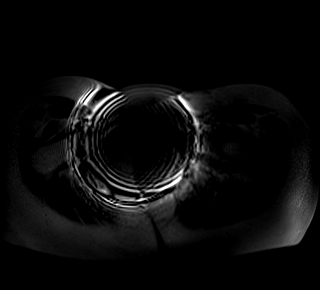

[Series 6: DWI · axial · 6.0mm · 1.42mm/px · 1 of 56 slices shown (1 of 4)]
[im 1/56]
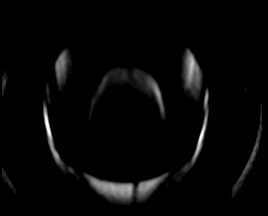

[Series 6: DWI · axial · 6.0mm · 1.42mm/px · 1 of 56 slices shown (2 of 4)]
[im 1/56]
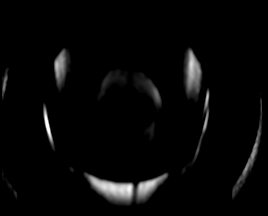

[Series 6: DWI · axial · 6.0mm · 1.42mm/px · 1 of 56 slices shown (3 of 4)]
[im 1/56]
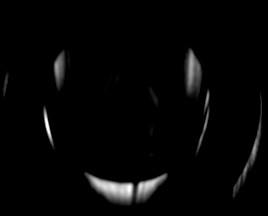

[Series 7: DWI · axial · 6.0mm · 1.42mm/px · 1 of 56 slices shown (4 of 4)]
[im 1/56]
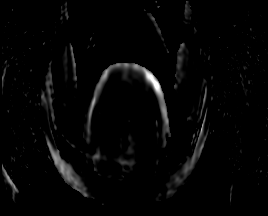

[Series 8: bSSFP · coronal · 8.0mm · 1.30mm/px · 1 of 20 slices shown (1 of 10)]
[im 1/20]
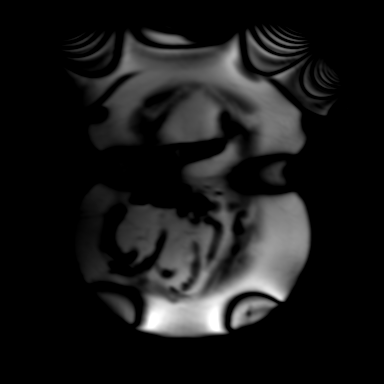

[Series 9: bSSFP · coronal · 8.0mm · 1.30mm/px · 1 of 20 slices shown (2 of 10)]
[im 1/20]
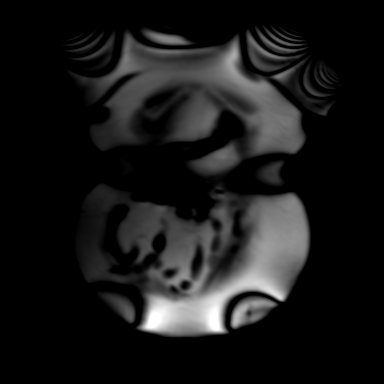

[Series 10: bSSFP · coronal · 8.0mm · 1.30mm/px · 1 of 20 slices shown (3 of 10)]
[im 1/20]
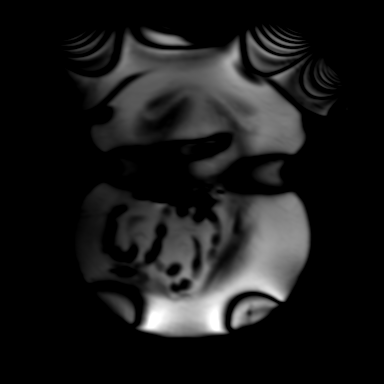

[Series 11: bSSFP · coronal · 8.0mm · 1.30mm/px · 1 of 20 slices shown (4 of 10)]
[im 1/20]
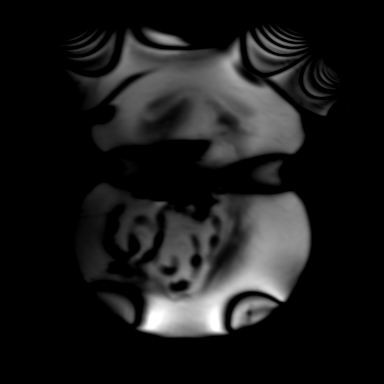

[Series 12: bSSFP · coronal · 8.0mm · 1.30mm/px · 1 of 20 slices shown (5 of 10)]
[im 1/20]
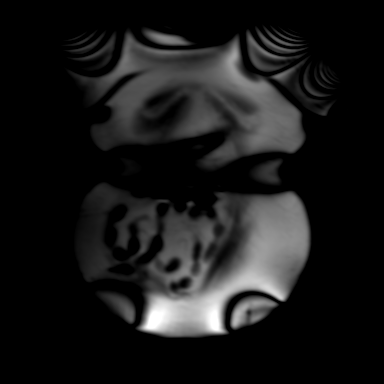

[Series 13: bSSFP · coronal · 8.0mm · 1.30mm/px · 1 of 20 slices shown (6 of 10)]
[im 1/20]
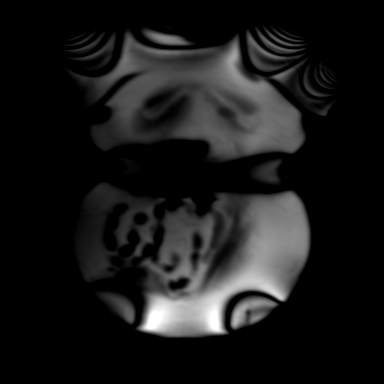

[Series 14: bSSFP · coronal · 8.0mm · 1.30mm/px · 1 of 20 slices shown (7 of 10)]
[im 1/20]
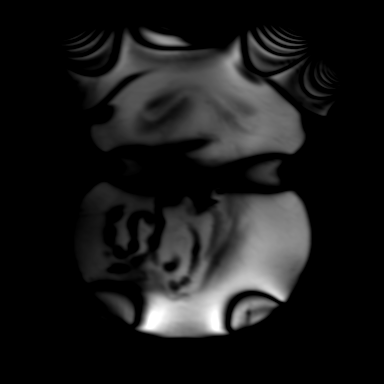

[Series 15: bSSFP · coronal · 8.0mm · 1.30mm/px · 1 of 20 slices shown (8 of 10)]
[im 1/20]
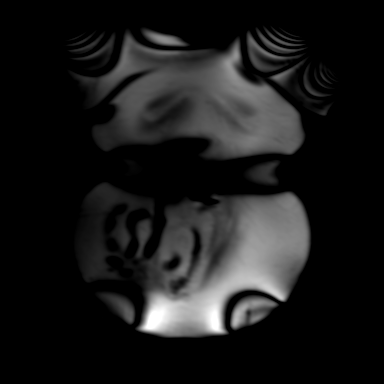

[Series 16: bSSFP · coronal · 8.0mm · 1.30mm/px · 1 of 20 slices shown (9 of 10)]
[im 1/20]
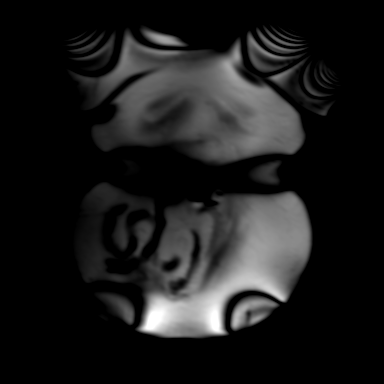

[Series 17: bSSFP · coronal · 8.0mm · 1.30mm/px · 1 of 20 slices shown (10 of 10)]
[im 1/20]
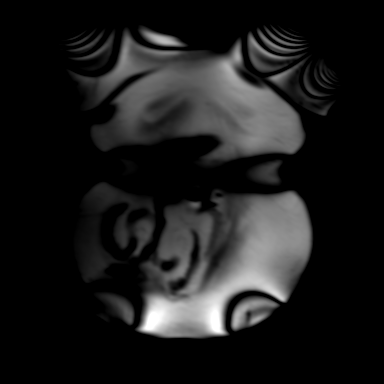

[Series 18: t1_vibe_fs_tra_p4_bh_pre · axial · 3.0mm · 1.19mm/px · z∈[-212,+265]mm · 3 of 160 slices shown]
[im 1/160]
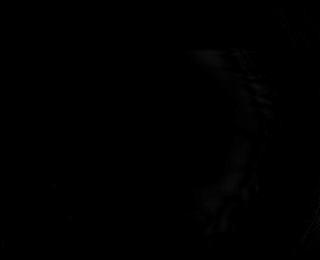
[im 80/160]
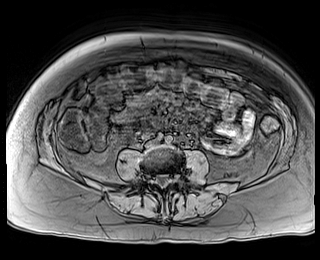
[im 160/160]
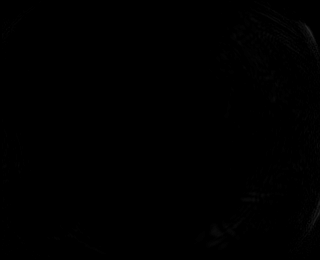

[Series 20: t1_vibe_fs_tra_p4_bh_post · axial · 3.0mm · 1.19mm/px · z∈[-212,+265]mm · 3 of 160 slices shown (1 of 4)]
[im 1/160]
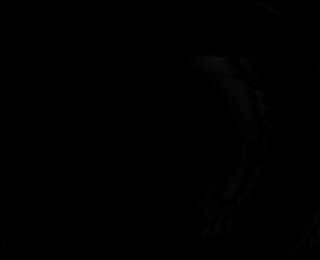
[im 80/160]
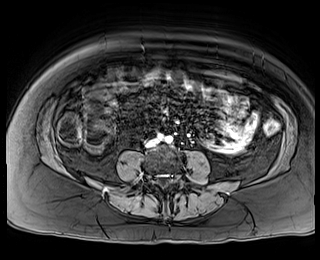
[im 160/160]
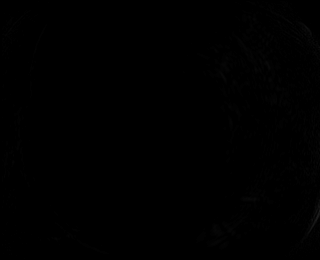

[Series 21: t1_vibe_fs_tra_p4_bh_post_sub · axial · 3.0mm · 1.19mm/px · z∈[-212,+265]mm · 3 of 160 slices shown (1 of 4)]
[im 1/160]
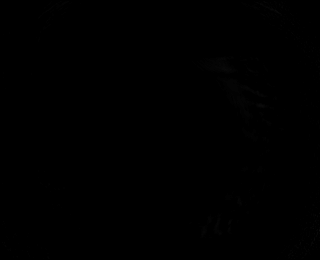
[im 80/160]
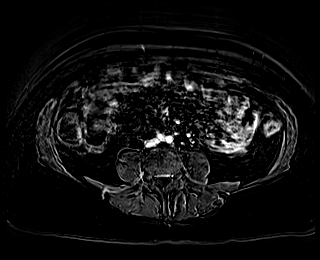
[im 160/160]
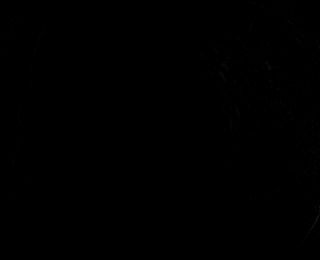

[Series 22: t1_vibe_fs_tra_p4_bh_post · axial · 3.0mm · 1.19mm/px · z∈[-212,+265]mm · 3 of 160 slices shown (2 of 4)]
[im 1/160]
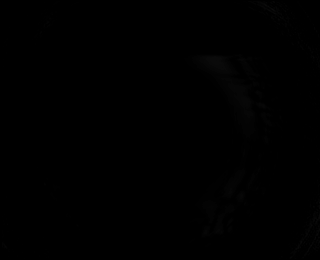
[im 80/160]
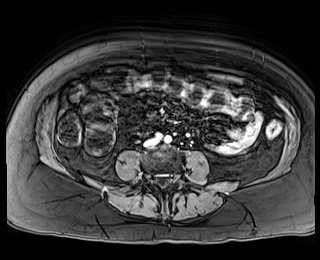
[im 160/160]
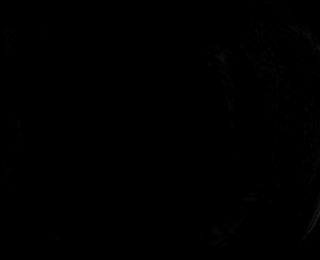

[Series 23: t1_vibe_fs_tra_p4_bh_post_sub · axial · 3.0mm · 1.19mm/px · z∈[-212,+265]mm · 3 of 160 slices shown (2 of 4)]
[im 1/160]
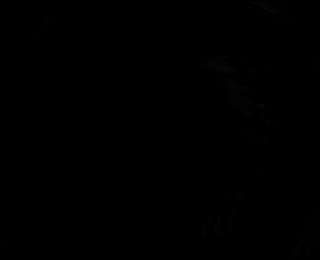
[im 80/160]
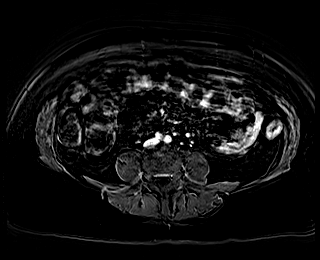
[im 160/160]
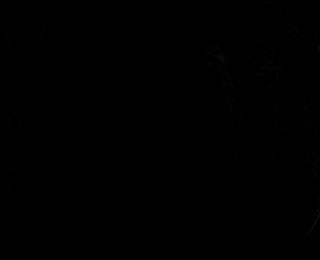

[Series 24: t1_vibe_fs_tra_p4_bh_post · axial · 3.0mm · 1.19mm/px · z∈[-212,+265]mm · 3 of 160 slices shown (3 of 4)]
[im 1/160]
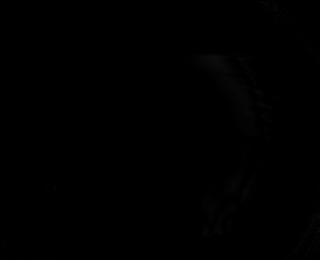
[im 80/160]
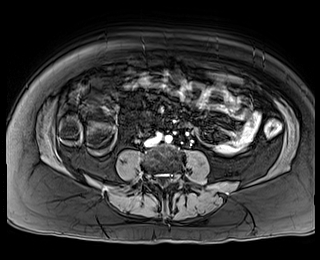
[im 160/160]
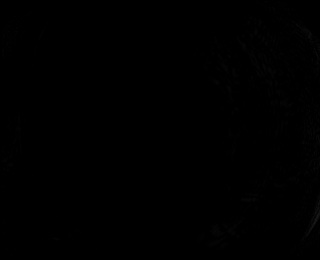

[Series 25: t1_vibe_fs_tra_p4_bh_post_sub · axial · 3.0mm · 1.19mm/px · z∈[-212,+265]mm · 4 of 160 slices shown (3 of 4)]
[im 1/160]
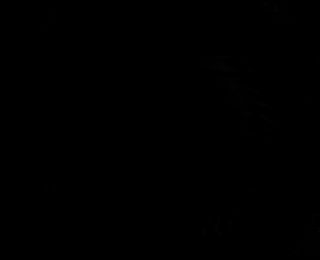
[im 54/160]
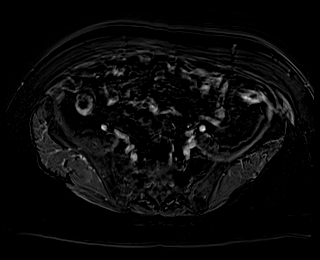
[im 107/160]
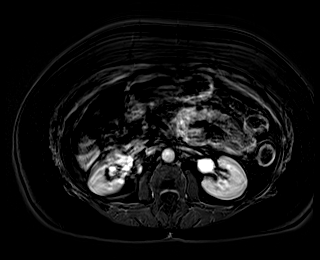
[im 160/160]
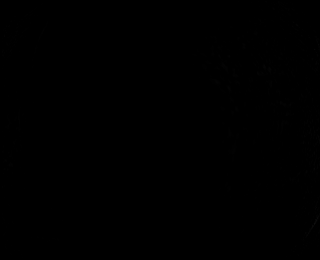

[Series 26: t1_vibe_fs_tra_p4_bh_post · axial · 3.0mm · 1.19mm/px · z∈[-212,+265]mm · 4 of 160 slices shown (4 of 4)]
[im 1/160]
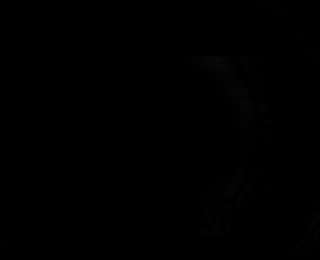
[im 54/160]
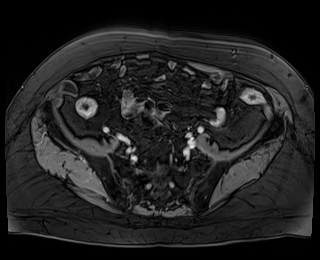
[im 107/160]
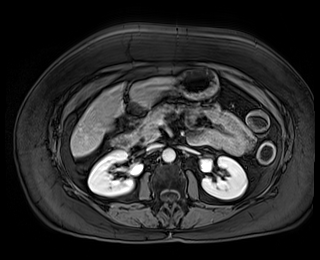
[im 160/160]
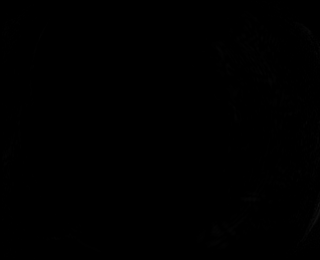

[Series 27: t1_vibe_fs_tra_p4_bh_post_sub · axial · 3.0mm · 1.19mm/px · z∈[-212,+106]mm · 3 of 160 slices shown (4 of 4)]
[im 1/160]
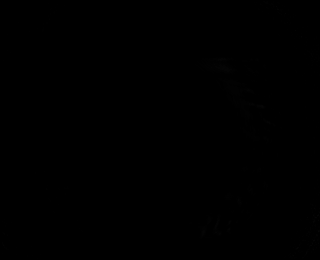
[im 54/160]
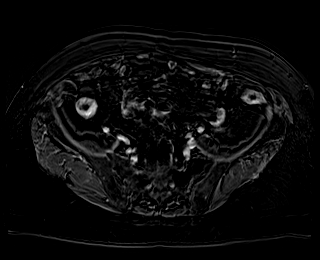
[im 107/160]
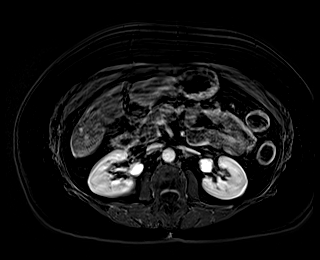

[45 of 48 positions shown; findings below may reference images not displayed]

FINDINGS: COMBINED FINDINGS FOR BOTH MR ABDOMEN AND PELVIS

Lower chest: Incidental imaging of the lung bases is unremarkable.

Hepatobiliary: Hepatic cysts as before. Limited assessment due to
respiratory motion on post-contrast images. Portal vein is patent.
No biliary duct dilation following cholecystectomy.

Pancreas: Pancreas without visible lesion, inflammation or contour
abnormality. No ductal dilation.

Spleen:  Spleen normal size and contour.

Adrenals/Urinary Tract:  Adrenal glands are normal.

Hemorrhagic cyst in the posterior LEFT kidney. No hydronephrosis. No
perinephric stranding. Urinary bladder with smooth contours. No
perivesical stranding.

Stomach/Bowel: Small bowel without signs of dilation. Small ventral
hernia several cm above the symphysis pubis without signs of acute
process.

Post ileocecal resection. Moderate irregular thickening of the
neoterminal ileum without surrounding inflammation, wall thickening
up to 7 mm. Increased thickening of the wall since prior imaging.
Best seen on image 43 of series 28 this displays low and
intermediate T2 signal. No upstream bowel dilation.

Mild wall thickening of the transverse colon and perhaps descending
colon. Sigmoid is collapsed completely limiting assessment.

Vascular/Lymphatic: Vascular structures in the abdomen are patent
without dilation. There is no gastrohepatic or hepatoduodenal
ligament lymphadenopathy. No retroperitoneal or mesenteric
lymphadenopathy.

No pelvic sidewall lymphadenopathy.

Reproductive: Visualized reproductive structures are unremarkable
without adnexal mass. Note that lower pelvic structures show limited
assessment due to field of view constraints

Other:  No ascites or abscess.  No signs of fistula formation.

Musculoskeletal: Unremarkable, no focal suspicious lesion.
IMPRESSION: 1. Irregular wall thickening of the neoterminal ileum likely related
to chronic stigmata of Crohn's disease perhaps with mild acute
component. Suggest correlation with prior endoscopy results or with
endoscopy if not recently performed to exclude any underlying
developing dysplasia given change and developing irregular
appearance since previous imaging.
2. Mild wall thickening of the transverse colon and perhaps
descending colon. Findings may represent mild colitis.
3. No signs of fistula formation, abscess or obstruction
4. Small ventral hernia several cm above the symphysis pubis without
signs of acute process.
5. Hemorrhagic cyst in the LEFT kidney.
6. Limited assessment due to respiratory motion on post-contrast
images.

## 2021-11-07 ENCOUNTER — Other Ambulatory Visit: Payer: Self-pay | Admitting: Pharmacy Technician

## 2021-11-07 NOTE — Telephone Encounter (Signed)
I called patient and let her know it was approved and she would get a call back about when appointment is scheduled. Her next infusion is due May 25th. Pt is aware when next infusion is due also. ?

## 2021-11-07 NOTE — Telephone Encounter (Signed)
Thanks for your help.

## 2021-11-10 ENCOUNTER — Telehealth (INDEPENDENT_AMBULATORY_CARE_PROVIDER_SITE_OTHER): Payer: Self-pay | Admitting: *Deleted

## 2021-11-10 ENCOUNTER — Telehealth: Payer: Self-pay | Admitting: Dermatology

## 2021-11-10 NOTE — Telephone Encounter (Signed)
Patient calling with 2 week update on scalp. Patient is using clobetasol foam as directed. Patient states that it has helped but issue not completely cleared. Patient has appointment on 4/26 for follow up and 2 lesions. ?

## 2021-11-10 NOTE — Telephone Encounter (Signed)
Patient called to let me know [facility] scheduled her for her next infusion of entyvio on may 25th. Has had 3 infusions at [facility]. She ate something spicy with tomatoes last week and mouth is broke out again in little red bumps, gums bleeding, has been doing salt rinse but not helping much. Wanted to see if [facility] could be called in again for her to [facility]. She wonders if this is from acid reflux? Had this happen while on humira.  ?

## 2021-11-11 ENCOUNTER — Other Ambulatory Visit (INDEPENDENT_AMBULATORY_CARE_PROVIDER_SITE_OTHER): Payer: Self-pay | Admitting: *Deleted

## 2021-11-11 MED ORDER — LIDOCAINE VISCOUS HCL 2 % MT SOLN: 5.0000 mL | Freq: Four times a day (QID) | OROMUCOSAL | 1 refills | Status: DC

## 2021-11-11 NOTE — Telephone Encounter (Signed)
Per Dr. Laural Golden - may refill [facility]'s mouthwash - same as last time with one additional refill. Pt should be seen in office if mouth is not better to rule out other causes. Does not feel this is reflux since she is on aciphex and not having heartburn ( patient states she is not having heartburn). Patient already has appt end of April. She states she will discuss further then and will call back if dukes does not help or if getting worse before appt. Also dr Laural Golden recommends seeing a dentist if dukes does not clear up. ?Patient verbalized understanding of all and I sent med to pharmacy.  ?

## 2021-11-23 ENCOUNTER — Encounter: Payer: Self-pay | Admitting: Gastroenterology

## 2021-11-23 ENCOUNTER — Ambulatory Visit (INDEPENDENT_AMBULATORY_CARE_PROVIDER_SITE_OTHER): Payer: Medicare Other | Admitting: Dermatology

## 2021-11-23 ENCOUNTER — Encounter: Payer: Self-pay | Admitting: Dermatology

## 2021-11-23 DIAGNOSIS — L659 Nonscarring hair loss, unspecified: Secondary | ICD-10-CM | POA: Diagnosis not present

## 2021-11-23 DIAGNOSIS — L308 Other specified dermatitis: Secondary | ICD-10-CM

## 2021-11-23 DIAGNOSIS — L720 Epidermal cyst: Secondary | ICD-10-CM | POA: Diagnosis not present

## 2021-11-24 ENCOUNTER — Encounter (INDEPENDENT_AMBULATORY_CARE_PROVIDER_SITE_OTHER): Payer: Self-pay | Admitting: Internal Medicine

## 2021-11-24 ENCOUNTER — Ambulatory Visit (INDEPENDENT_AMBULATORY_CARE_PROVIDER_SITE_OTHER): Payer: Medicare Other | Admitting: Internal Medicine

## 2021-11-24 VITALS — BP 127/79 | HR 76 | Temp 99.2°F | Ht 66.5 in | Wt 169.6 lb

## 2021-11-24 DIAGNOSIS — K120 Recurrent oral aphthae: Secondary | ICD-10-CM

## 2021-11-24 DIAGNOSIS — K5000 Crohn's disease of small intestine without complications: Secondary | ICD-10-CM

## 2021-11-24 DIAGNOSIS — K5900 Constipation, unspecified: Secondary | ICD-10-CM

## 2021-11-24 DIAGNOSIS — K219 Gastro-esophageal reflux disease without esophagitis: Secondary | ICD-10-CM | POA: Diagnosis not present

## 2021-11-24 MED ORDER — B COMPLEX-C PO TABS: 1.0000 | ORAL_TABLET | Freq: Every day | ORAL | Status: DC

## 2021-11-24 MED ORDER — RABEPRAZOLE SODIUM 20 MG PO TBEC: 20.0000 mg | DELAYED_RELEASE_TABLET | Freq: Every day | ORAL | 3 refills | Status: DC

## 2021-11-24 MED ORDER — DOCUSATE SODIUM 100 MG PO CAPS: 200.0000 mg | ORAL_CAPSULE | Freq: Every day | ORAL | 0 refills | Status: AC

## 2021-11-24 NOTE — Progress Notes (Signed)
duplicate

## 2021-11-24 NOTE — Patient Instructions (Signed)
Discontinue folic acid and Viactiv ?Begin B complex with vitamin C 1 tablet daily. ?Notify if oral symptoms get worse in which case will request ENT consultation. ?Notify if mouth ulcers or soreness get worse after next dose of Entyvio. ?

## 2021-11-24 NOTE — Progress Notes (Signed)
?Presenting complaint; ? ?Follow-up for Crohn's disease and GERD. ?Patient complains of constipation and recurrent mouth sores. ? ?Database and subjective: ? ?Patient is 76 year old Caucasian female who is here for scheduled visit.  Her last visit was on 08/23/2021. ?She has over 40-year history of Crohn's disease with first surgery about 13 years ago and second surgery 1995 when she had microperforation leading to right hemicolectomy([facility]) she has done well over the years with oral mesalamine and periodic course of steroids(prednisone and budesonide). ?Last surveillance colonoscopy was in November 2021 revealing normal mucosa of the colon patent ileocolonic anastomosis with ileal disease involving about 2 cm of neoterminal ileum.  Biopsy confirmed active disease.  There was no colonic disease.  She had diverticula at transverse and sigmoid colon. ?She developed symptoms of relapse in August 2022 when she was admitted to [facility].  She had small bowel dilation proximal to ileal stricture.  She was treated with NG tube and IV fluids and steroids. ?Patient was begun on Humira/adalimumab in September 2022 induction followed by maintenance therapy and her abdominal pain resolved. ?However she developed fever blisters and burning tongue.  She did not respond to Magic mouthwash.  I felt the symptoms could be due to biologic which was discontinued on July 28, 2021. ? ?On her last visit of 08/23/2021 oral symptoms are better.  She noted rash over her scalp.  She was seen by dermatologist and therapy recommended. ?She was begun on Entyvio/vedolizumab.  She received induction therapy on 09/15/2021, 09/29/2021 and 10/27/2021.  Her next dose is due on 12/22/2021. ? ?She states Aspers abdominal pain is concerned she is doing a lot better she is not having any abdominal pain.  She is having 1-2 formed stools per day.  Lately she has been constipated.  No history of vomiting but she says she is nauseated today.  She feels cold but no  history of rigors or fever. ?She is having heartburn once or twice every 14 days.  She feels she is doing all right with PPI alternating with H2B. ?She continues to complain of sore tongue and fever blisters.  She is not having odynophagia or dysphagia. ?Her appetite is good.  Her weight is down by 3 pounds.  No history of melena or rectal bleeding. ?She is also not having any side effects with anterior/vedolizumab. ? ?Hepatitis B surface antigen and QuantiFERON TB Gold plus were negative in August 2022. ? ?Patient brings a long copy of blood work performed on 08/26/2021 by Dr. Gar Ponto.  This is summarized under lab data. ? ? ? ?Current Medications: ?Outpatient Encounter Medications as of 11/24/2021  ?Medication Sig  ? acetaminophen (TYLENOL) 500 MG tablet Take 500 mg by mouth every 6 (six) hours as needed for moderate pain or headache.  ? Carboxymethylcellul-Glycerin (REFRESH RELIEVA OP) Place 1-2 drops into both eyes 3 (three) times daily as needed (dry eyes).   ? Cholecalciferol (VITAMIN D3) 2000 UNITS TABS Take 2,000 Units by mouth daily.  ? clobetasol (OLUX) 0.05 % topical foam Apply topically 2 (two) times daily.  ? famotidine (PEPCID) 10 MG tablet Take 10 mg by mouth 2 (two) times daily. Alternate days from Aciphex  ? folic acid (FOLVITE) 646 MCG tablet Take 400 mcg by mouth daily.  ? Multiple Vitamins-Calcium (VIACTIV MULTI-VITAMIN) CHEW Chew 2 tablets by mouth daily.  ? RABEprazole (ACIPHEX) 20 MG tablet Take 1 tablet (20 mg total) by mouth every other day.  ? sodium chloride (OCEAN) 0.65 % SOLN nasal spray Place 1 spray  into both nostrils daily.  ? vedolizumab (ENTYVIO) 300 MG injection Inject 300 mg into the vein every 8 (eight) weeks.  ? [DISCONTINUED] Calcium-Vitamin D-Vitamin K (VIACTIV PO) Take 1 tablet by mouth 3 (three) times daily.  ? Clobetasol Propionate 0.05 % shampoo Apply topically daily. (Patient not taking: Reported on 11/24/2021)  ? magic mouthwash (lidocaine, diphenhydrAMINE, alum & mag  hydroxide) suspension Swish and swallow 5 mLs 4 (four) times daily. (Patient not taking: Reported on 11/24/2021)  ? [DISCONTINUED] fluocinonide (LIDEX) 0.05 % external solution Apply 1 application topically 2 (two) times daily.  ? ?No facility-administered encounter medications on file as of 11/24/2021.  ? ? ?Objective: ?Blood pressure 127/79, pulse 76, temperature 99.2 ?F (37.3 ?C), temperature source Oral, height 5' 6.5" (1.689 m), weight 169 lb 9.6 oz (76.9 kg). ?Patient is alert and in no acute distress. ?Conjunctiva is pink. Sclera is nonicteric ?Oropharyngeal mucosa is normal. ?She has a patchy erythematous discoloration to tongue mucosa.  No erosions noted on today's exam ?No neck masses or thyromegaly noted. ?Cardiac exam with regular rhythm normal S1 and S2. No murmur or gallop noted. ?Lungs are clear to auscultation. ?Abdomen is symmetrical.  She has horizontal scar in right lower quadrant and midline scar.  Bowel sounds are normal.  On palpation abdomen is soft and nontender.  Vague fullness in right lower quadrant which is a chronic finding.  No organomegaly or masses. ?No LE edema or clubbing noted. ? ?Labs/studies Results: ? ?Lab data from 08/25/2021 ? ?WBC 6.9, H&H 14 and 42.2, MCV 89 and platelet count 310 K. ?Differential is normal. ? ?Glucose 91, BUN 14, creatinine 0.80 ?Serum sodium 149, potassium 4.3, chloride 102, CO2 23 serum calcium 9.8 ?Bilirubin 0.4, AP 92, AST 22, ALT 18 total protein 7.3 and albumin 4.2. ?Serum globulin 3.1. ?CRP is 3  (0-10). ? ?MR enterography on 03/03/2021 revealed localized wall thickening to neoterminal ileum but no small bowel dilation upstream.  Mild wall thickening noted to transverse colon.  Small ventral hernia ? ?Assessment: ? ?#1.  Small bowel Crohn's disease.  She has been on oral mesalamine for years.  Her disease relapsed 8 months ago requiring hospitalization for small bowel obstruction but she responded to medical therapy.  She responded to anti-TNF(adalimumab)  which was discontinued because she developed oral stomatitis.  She is now on alpha 4 beta 7 integrin antibody(vedolizumab) she has completed induction therapy and appears to be doing well.  She will continue maintenance therapy every 8 weeks. ? ?Last bone density study in July 2027 reveals osteopenia.  She is due for study this year.  She will make sure we get a copy of the report. ? ?#2.  Chronic GERD.  EGD and November 2021 revealed small sliding hiatal hernia but no evidence of erosive esophagitis or Barrett's.  Esophagus was dilated by passing 65 [place] dilator because she was having dysphagia.  Symptoms are well controlled with PPI alternating with H2B every other day but she remains with orolingual symptoms.  Remote possibility that the symptoms are due to GERD but nevertheless place her back on PPI daily for the time being. ? ?#3.  Constipation.  We will trial her on Colace and if it does not work will use polyethylene glycol.  Patient advised not to take stimulant OTC laxatives. ? ?#4.  Stomatitis.  Exam today reveals geographic tongue.  She has had aphthous ulcers as well.  Clearly stomatitis is not due to biologic therapy as stopping [place] did not lead to resolution.  She has been evaluated by dentist and no issues there.  I wonder if she is developing creatinine or autoimmune disease.  If she does not respond to B complex vitamin would consider referral to ENT specialist. ? ? ?Plan: ? ?Discontinue Viactiv and folic acid. ?Continue MTBE/vedolizumab at a dose of 300 mg IV every 8 hours for maintenance.  Next dose due on 12/22/2021. ?Begin B complex with vitamin C 1 tablet by mouth daily. ?Colace 200 mg by mouth daily at bedtime. ?Patient will return for follow-up visit  in 6 weeks. ? ? ? ? ?

## 2021-12-10 ENCOUNTER — Encounter: Payer: Self-pay | Admitting: Dermatology

## 2021-12-10 NOTE — Progress Notes (Signed)
? ?  Follow-Up Visit ?  ?Subjective  ?Gail Henry is a 76 y.o. female who presents for the following: Follow-up (F/u scalp- better- tx- clobetasol foam). ? ?Scalp with itch, rash, loss of hair ?Location:  ?Duration:  ?Quality:  ?Associated Signs/Symptoms: ?Modifying Factors:  ?Severity:  ?Timing: ?Context:  ? ?Objective  ?Well appearing patient in no apparent distress; mood and affect are within normal limits. ?Scalp ? Follow up on scalp: Great improvement in visible scale/inflammation and itching. ? ?Scalp ?Loss of density may be a combination of inflammatory hair loss plus female pattern.  If her inflammation control, she could certainly try minoxidil. ? ?Mid Back (3) ?3 inflamed cyst on back ? ? ? ?A focused examination was performed including head and neck.. Relevant physical exam findings are noted in the Assessment and Plan. ? ? ?Assessment & Plan  ? ? ?Psoriasiform dermatitis ?Scalp ? ?Patient reminded that if this is true psoriasis the medicine represents a treatment rather than a cure and she could try tapering.  Also told that scratching will exacerbate psoriasis and prevent it from healing. ? ?Related Medications ?clobetasol (OLUX) 0.05 % topical foam ?Apply topically 2 (two) times daily. ? ?Nonscarring hair loss ?Scalp ? ?Taper the clobetasol and get otc Rogaine or generic 5% minoxidil foam.  Minimal trial with this would be 6 months. ? ?Epidermal cyst (3) ?Mid Back ? ? ? ? ? ?I, Lavonna Monarch, MD, have reviewed all documentation for this visit.  The documentation on 12/10/21 for the exam, diagnosis, procedures, and orders are all accurate and complete. ?

## 2021-12-12 ENCOUNTER — Other Ambulatory Visit (INDEPENDENT_AMBULATORY_CARE_PROVIDER_SITE_OTHER): Payer: Self-pay | Admitting: Internal Medicine

## 2021-12-12 NOTE — Telephone Encounter (Signed)
Last seen 11/24/2021 has upcoming appt 01/05/2022. ?

## 2021-12-12 NOTE — Telephone Encounter (Signed)
I called patient and she states Dr. Laural Golden gave her a refill that she just picked up and med must be on auto refill. She states she is better but not completely healed. She will call back when she finishes med if still having symptoms.  ?

## 2021-12-22 ENCOUNTER — Telehealth (INDEPENDENT_AMBULATORY_CARE_PROVIDER_SITE_OTHER): Payer: Self-pay | Admitting: *Deleted

## 2021-12-22 ENCOUNTER — Ambulatory Visit (INDEPENDENT_AMBULATORY_CARE_PROVIDER_SITE_OTHER): Payer: Medicare Other

## 2021-12-22 ENCOUNTER — Other Ambulatory Visit (INDEPENDENT_AMBULATORY_CARE_PROVIDER_SITE_OTHER): Payer: Self-pay | Admitting: *Deleted

## 2021-12-22 VITALS — BP 128/72 | HR 60 | Temp 97.9°F | Resp 16 | Ht 66.0 in | Wt 171.0 lb

## 2021-12-22 DIAGNOSIS — K50019 Crohn's disease of small intestine with unspecified complications: Secondary | ICD-10-CM | POA: Diagnosis not present

## 2021-12-22 MED ORDER — FLUCONAZOLE 150 MG PO TABS: 150.0000 mg | ORAL_TABLET | Freq: Every day | ORAL | 0 refills | Status: DC

## 2021-12-22 MED ORDER — VEDOLIZUMAB 300 MG IV SOLR
300.0000 mg | INTRAVENOUS | Status: DC
  Administered 2021-12-22: 300 mg via INTRAVENOUS
  Filled 2021-12-22: qty 5

## 2021-12-22 NOTE — Telephone Encounter (Signed)
Pt had 4th entyvio infusion today. States tech giving infusion asked if she had tried diflucan and pt wanted to ask dr Laural Golden about it. She states her mouth is better but still having irritation when eating and tongue does have some white on it. Mouth states dry. She is taking bit b complex one daily as recommended at last visit.   [phone]

## 2021-12-22 NOTE — Telephone Encounter (Signed)
Per dr Laural Golden send in diflucan 177m take one daily for 7 days. I sent in rx and called and discussed with patient.

## 2021-12-22 NOTE — Progress Notes (Addendum)
Diagnosis: Crohn's Disease  Provider:  Marshell Garfinkel, MD  Procedure: Infusion  IV Type: Peripheral, IV Location: R Forearm  Entyvio (Vedolizumab), Dose: 300 mg  Infusion Start Time: 6948  Infusion Stop Time: 5462  Post Infusion IV Care: Peripheral IV Discontinued  Discharge: Condition: Good, Destination: Home . AVS provided to patient.   Performed by:  Paul Dykes, RN

## 2022-01-05 ENCOUNTER — Encounter (INDEPENDENT_AMBULATORY_CARE_PROVIDER_SITE_OTHER): Payer: Self-pay | Admitting: Internal Medicine

## 2022-01-05 ENCOUNTER — Ambulatory Visit (INDEPENDENT_AMBULATORY_CARE_PROVIDER_SITE_OTHER): Payer: Medicare Other | Admitting: Internal Medicine

## 2022-01-05 VITALS — BP 122/75 | HR 76 | Temp 97.9°F | Ht 66.0 in | Wt 170.4 lb

## 2022-01-05 DIAGNOSIS — K219 Gastro-esophageal reflux disease without esophagitis: Secondary | ICD-10-CM | POA: Diagnosis not present

## 2022-01-05 DIAGNOSIS — K5000 Crohn's disease of small intestine without complications: Secondary | ICD-10-CM | POA: Diagnosis not present

## 2022-01-05 DIAGNOSIS — R208 Other disturbances of skin sensation: Secondary | ICD-10-CM | POA: Diagnosis not present

## 2022-01-05 NOTE — Progress Notes (Unsigned)
Presenting complaint;  Follow-up for Crohn disease. Patient continues to complain of orolingual burning.  Database and subjective:  Patient is 76 year old Caucasian female who is here for scheduled visit.  She was last seen on 11/24/2021. She was diagnosed with Crohn disease when she was in early or mid 49s at the time of surgery.  Her second surgery was in 1995 for microperforation leading to right hemicolectomy performed by Dr.  Anthony Sar of [facility].  She has been maintained on oral mesalamine with sporadic use of prednisone and/or budesonide. Last colonoscopy was in November 2021 revealing mucosa of the colon patent ileocolonic anastomosis and ileal disease involving 2 cm of neoterminal ileum.  She also had diverticuli and transferred and sigmoid colon. She developed relapse in August 2022 when she was admitted to [facility] in [facility] for bowel obstruction and treated with NG tube IV fluids and steroids. She was therefore begun Humira in September 2022.  Patient developed fever blisters and burning tongue.  She had transient response to Magic mouthwash.  Orolingual symptoms persisted.  Therefore Humira was discontinued on July 28, 2021. She thought it also flared up her for some boric dermatitis.  Patient was begun on Entyvio/vedolizumab in February 2023.  She received 3 doses for induction and first maintenance dose was on 12/22/2021.  Patient feels she is doing much better as far as her Crohn's disease is concerned.  She is not having formed stools.  She generally has 1-2 stools daily.  She denies melena or rectal bleeding.  She may have occasional gas pain which is relieved with defecation or when she passes flatus.  She is not having nausea or vomiting.  Her appetite is good.  Her weight has been stable. She says heartburn is well controlled with PPI.  Only time she has regurgitation is when she bends after meals. She continues to complain of burning in her mouth and  tongue.  She she is using lozenges toothpaste and mouthwash but nothing provides complete relief.  She was seen by dentist and no abnormality noted to account for her symptoms.  She says she is not having dry eyes anymore. She did not notice worse symptoms after her last infusion of vedolizumab on 12/22/2021.  Only side effect she is experiences fatigue. She saw Dr. Denna Haggard and she is using topical and scalp rashes better.  Current Medications: Outpatient Encounter Medications as of 01/05/2022  Medication Sig   acetaminophen (TYLENOL) 500 MG tablet Take 500 mg by mouth every 6 (six) hours as needed for moderate pain or headache.   B Complex-C (B-COMPLEX WITH VITAMIN C) tablet Take 1 tablet by mouth daily.   Carboxymethylcellul-Glycerin (REFRESH RELIEVA OP) Place 1-2 drops into both eyes 3 (three) times daily as needed (dry eyes).    Cholecalciferol (VITAMIN D3) 2000 UNITS TABS Take 2,000 Units by mouth daily.   docusate sodium (COLACE) 100 MG capsule Take 2 capsules (200 mg total) by mouth at bedtime.   Multiple Vitamins-Minerals (MULTIVITAMIN WITH MINERALS) tablet Take 1 tablet by mouth. Takes 2 daily   RABEprazole (ACIPHEX) 20 MG tablet Take 1 tablet (20 mg total) by mouth daily before breakfast.   sodium chloride (OCEAN) 0.65 % SOLN nasal spray Place 1 spray into both nostrils daily.   vedolizumab (ENTYVIO) 300 MG injection Inject 300 mg into the vein every 8 (eight) weeks.   clobetasol (OLUX) 0.05 % topical foam Apply topically 2 (two) times daily. (Patient not taking: Reported on 01/05/2022)   magic mouthwash (lidocaine,  diphenhydrAMINE, alum & mag hydroxide) suspension Swish and swallow 5 mLs 4 (four) times daily. (Patient not taking: Reported on 11/24/2021)   [DISCONTINUED] Clobetasol Propionate 0.05 % shampoo Apply topically daily. (Patient not taking: Reported on 11/24/2021)   [DISCONTINUED] fluconazole (DIFLUCAN) 150 MG tablet Take 1 tablet (150 mg total) by mouth daily.   No  facility-administered encounter medications on file as of 01/05/2022.     Objective: Blood pressure 122/75, pulse 76, temperature 97.9 F (36.6 C), temperature source Oral, height 5' 6"  (1.676 m), weight 170 lb 6.4 oz (77.3 kg). Patient is alert and in no acute distress. Conjunctiva is pink. Sclera is nonicteric Oropharyngeal mucosa is normal.  There is erythema to tongue around the borders.  No erosions or ulcers noted. No neck masses or thyromegaly noted. Cardiac exam with regular rhythm normal S1 and S2. No murmur or gallop noted. Lungs are clear to auscultation. Abdomen is symmetrical she has long midline scar along with horizontal scar in right mid/lower abdomen.  On palpation abdomen is soft.  She has fullness and mild tenderness towards the lateral end of horizontal scar.  No organomegaly or masses. No LE edema or clubbing noted.  Labs/studies Results:      Latest Ref Rng & Units 04/17/2019    7:53 AM 11/18/2015    4:30 PM 11/03/2014    9:58 AM  CBC  WBC 3.8 - 10.8 Thousand/uL 6.7  7.1  6.6   Hemoglobin 11.7 - 15.5 g/dL 13.5  13.4  14.2   Hematocrit 35.0 - 45.0 % 40.5  40.3  41.7   Platelets 140 - 400 Thousand/uL 284  307  333        Latest Ref Rng & Units 04/17/2019    7:53 AM 11/18/2015    6:16 PM 02/17/2013   10:22 AM  CMP  Glucose 65 - 99 mg/dL 87     BUN 7 - 25 mg/dL 16     Creatinine 0.60 - 0.93 mg/dL 0.79  0.70    Sodium 135 - 146 mmol/L 141     Potassium 3.5 - 5.3 mmol/L 4.0     Chloride 98 - 110 mmol/L 104     CO2 20 - 32 mmol/L 31     Calcium 8.6 - 10.4 mg/dL 9.7     Total Protein 6.1 - 8.1 g/dL 6.6   7.3   Total Bilirubin 0.2 - 1.2 mg/dL 0.3   0.4   Alkaline Phos 39 - 117 U/L   88   AST 10 - 35 U/L 15   20   ALT 6 - 29 U/L 9   16        Latest Ref Rng & Units 04/17/2019    7:53 AM 02/17/2013   10:22 AM 02/22/2012    7:03 AM  Hepatic Function  Total Protein 6.1 - 8.1 g/dL 6.6  7.3  6.9   Albumin 3.5 - 5.2 g/dL  4.1  3.9   AST 10 - 35 U/L 15  20  17     ALT 6 - 29 U/L 9  16  16    Alk Phosphatase 39 - 117 U/L  88  96   Total Bilirubin 0.2 - 1.2 mg/dL 0.3  0.4  0.4   Bilirubin, Direct 0.0 - 0.3 mg/dL  0.1      Lab Results  Component Value Date   CRP 3.7 10/26/2020      Assessment:  #1.  Small bowel Crohn's disease.  She has received 4 doses  of Entyvio/vedolizumab and appears to be doing better.  She is not having diarrhea anymore and abdominal pain and tenderness has decreased significantly.  She will continue this medication as long as working and she is not having any side effects.  #2.  Orolingual burning.  She does not have fever blisters or aphthous ulcers anymore.  I do not believe the symptoms are due to Crohn disease or medication.  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  She would benefit from ENT evaluation.  #3.  Chronic GERD.  She is doing well with therapy.   Plan:  She will continue vedolizumab and Rabeprazole as before. Patient will go to the lab for CBC with differential, comprehensive chemistry panel, sed rate, CRP and ANA. ENT consultation. Office visit with Dr. Jenetta Downer in 3 months.

## 2022-01-05 NOTE — Patient Instructions (Signed)
Consultation with Dr. Benjamine Mola. Physician will call with results of blood test.

## 2022-01-09 LAB — ANTI-NUCLEAR AB-TITER (ANA TITER): ANA Titer 1: 1:320 {titer} — ABNORMAL HIGH

## 2022-01-09 LAB — COMPREHENSIVE METABOLIC PANEL
AG Ratio: 1.3 (calc) (ref 1.0–2.5)
ALT: 15 U/L (ref 6–29)
AST: 20 U/L (ref 10–35)
Albumin: 4 g/dL (ref 3.6–5.1)
Alkaline phosphatase (APISO): 90 U/L (ref 37–153)
BUN: 12 mg/dL (ref 7–25)
CO2: 29 mmol/L (ref 20–32)
Calcium: 9.7 mg/dL (ref 8.6–10.4)
Chloride: 104 mmol/L (ref 98–110)
Creat: 0.63 mg/dL (ref 0.60–1.00)
Globulin: 3.1 g/dL (calc) (ref 1.9–3.7)
Glucose, Bld: 84 mg/dL (ref 65–139)
Potassium: 4.5 mmol/L (ref 3.5–5.3)
Sodium: 140 mmol/L (ref 135–146)
Total Bilirubin: 0.3 mg/dL (ref 0.2–1.2)
Total Protein: 7.1 g/dL (ref 6.1–8.1)

## 2022-01-09 LAB — CBC WITH DIFFERENTIAL/PLATELET
Absolute Monocytes: 479 cells/uL (ref 200–950)
Basophils Absolute: 28 cells/uL (ref 0–200)
Basophils Relative: 0.5 %
Eosinophils Absolute: 88 cells/uL (ref 15–500)
Eosinophils Relative: 1.6 %
HCT: 39.2 % (ref 35.0–45.0)
Hemoglobin: 13.1 g/dL (ref 11.7–15.5)
Lymphs Abs: 2035 cells/uL (ref 850–3900)
MCH: 29.2 pg (ref 27.0–33.0)
MCHC: 33.4 g/dL (ref 32.0–36.0)
MCV: 87.3 fL (ref 80.0–100.0)
MPV: 10.2 fL (ref 7.5–12.5)
Monocytes Relative: 8.7 %
Neutro Abs: 2871 cells/uL (ref 1500–7800)
Neutrophils Relative %: 52.2 %
Platelets: 307 10*3/uL (ref 140–400)
RBC: 4.49 10*6/uL (ref 3.80–5.10)
RDW: 13.1 % (ref 11.0–15.0)
Total Lymphocyte: 37 %
WBC: 5.5 10*3/uL (ref 3.8–10.8)

## 2022-01-09 LAB — SEDIMENTATION RATE: Sed Rate: 33 mm/h — ABNORMAL HIGH (ref 0–30)

## 2022-01-09 LAB — ANA: Anti Nuclear Antibody (ANA): POSITIVE — AB

## 2022-01-09 LAB — C-REACTIVE PROTEIN: CRP: 5.5 mg/L (ref <8.0)

## 2022-01-12 ENCOUNTER — Other Ambulatory Visit (INDEPENDENT_AMBULATORY_CARE_PROVIDER_SITE_OTHER): Payer: Self-pay | Admitting: Internal Medicine

## 2022-01-12 DIAGNOSIS — K120 Recurrent oral aphthae: Secondary | ICD-10-CM

## 2022-01-13 ENCOUNTER — Other Ambulatory Visit (INDEPENDENT_AMBULATORY_CARE_PROVIDER_SITE_OTHER): Payer: Self-pay | Admitting: *Deleted

## 2022-01-19 LAB — ANA+ENA+DNA/DS+SCL 70+SJOSSA/B
ANA Titer 1: NEGATIVE
ENA RNP Ab: <0.2 AI (ref 0.0–0.9)
ENA SM Ab Ser-aCnc: <0.2 AI (ref 0.0–0.9)
ENA SSA (RO) Ab: <0.2 AI (ref 0.0–0.9)
ENA SSB (LA) Ab: <0.2 AI (ref 0.0–0.9)
Scleroderma (Scl-70) (ENA) Antibody, IgG: <0.2 AI (ref 0.0–0.9)
dsDNA Ab: 12 IU/mL — ABNORMAL HIGH (ref 0–9)

## 2022-02-02 ENCOUNTER — Telehealth (INDEPENDENT_AMBULATORY_CARE_PROVIDER_SITE_OTHER): Payer: Self-pay | Admitting: *Deleted

## 2022-02-02 NOTE — Telephone Encounter (Signed)
She may want to start seeing the rheumatologist first. Tell her to take pictures of her wounds and to show them to the rheumatologist. Thanks

## 2022-02-02 NOTE — Telephone Encounter (Signed)
Patient called because she got a call from [facility] rheumatology about referral Dr. Laural Golden sent over. She states he called her with results from labs on 01/16/22.   Copied Dr. Olevia Perches message below: Results reviewed with patient. Double-stranded DNA antibodies elevated.  Previously had been 25 and 11. She remains with oral symptoms although also aphthous ulcers have healed. She has been seen by rheumatologist last year.  I would recommend reevaluation. Please make an appointment with [facility] rheumatology. Abnormal blood test concerning for connective tissue disease.  History of aphthous ulcers which have healed but she remains with oral symptoms.  Patient states she talked with Dr. Laural Golden about results but he told her he was going to speak with his daughter who was a rheumatologist and he did not tell her he was doing a referral. She wanted me to check to see if she needed to see ENT also because at her OV on 6/8 that was discussed.   See Dr. Olevia Perches OV note copied below: #2.  Orolingual burning.  She does not have fever blisters or aphthous ulcers anymore.  I do not believe the symptoms are due to Crohn disease or medication.  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  She would benefit from ENT evaluation.   She states the sores are gone from her mouth. She still gets one from time to time. Inflammation is gone. Still has metal taste in mouth. She was wondering if this was all connected and just needs to see rheumatology or does she also need ENT referral.

## 2022-02-03 NOTE — Telephone Encounter (Signed)
Discussed with patient and she verbalized understanding.

## 2022-02-06 ENCOUNTER — Telehealth (INDEPENDENT_AMBULATORY_CARE_PROVIDER_SITE_OTHER): Payer: Self-pay | Admitting: *Deleted

## 2022-02-06 NOTE — Telephone Encounter (Signed)
Rec'd note back from [place] and after review by their providers, they have declined referral stating provider doesn't feel the need to re-evaluate the elevated antibodies again at this time. Patient is aware

## 2022-02-15 ENCOUNTER — Encounter (INDEPENDENT_AMBULATORY_CARE_PROVIDER_SITE_OTHER): Payer: Self-pay | Admitting: Gastroenterology

## 2022-02-15 ENCOUNTER — Telehealth: Payer: Self-pay | Admitting: *Deleted

## 2022-02-15 NOTE — Telephone Encounter (Signed)
Patient called and wanted to know why her appt on 9/14 had been canceled and she wanted to reschedule her appt. She does not like early morning or late afternoon due to having to take grandkids to school and pick them up.   [phone]

## 2022-02-16 ENCOUNTER — Ambulatory Visit (INDEPENDENT_AMBULATORY_CARE_PROVIDER_SITE_OTHER): Payer: Medicare Other

## 2022-02-16 VITALS — BP 130/81 | HR 61 | Temp 98.3°F | Resp 16 | Ht 66.5 in | Wt 171.4 lb

## 2022-02-16 DIAGNOSIS — K50019 Crohn's disease of small intestine with unspecified complications: Secondary | ICD-10-CM

## 2022-02-16 MED ORDER — VEDOLIZUMAB 300 MG IV SOLR
300.0000 mg | INTRAVENOUS | Status: DC
  Administered 2022-02-16: 300 mg via INTRAVENOUS
  Filled 2022-02-16: qty 5

## 2022-02-16 NOTE — Progress Notes (Signed)
Diagnosis: Crohn's Disease  Provider:  Marshell Garfinkel, MD  Procedure: Infusion  IV Type: Peripheral, IV Location: L Forearm  Entyvio (Vedolizumab), Dose: 300 mg  Infusion Start Time: 7953  Infusion Stop Time: 6922  Post Infusion IV Care: Peripheral IV Discontinued  Discharge: Condition: Good, Destination: Home . AVS provided to patient.   Performed by:  Koren Shiver, RN

## 2022-03-31 ENCOUNTER — Telehealth: Payer: Self-pay | Admitting: Pharmacy Technician

## 2022-03-31 ENCOUNTER — Telehealth: Payer: Self-pay | Admitting: *Deleted

## 2022-03-31 NOTE — Chronic Care Management (AMB) (Signed)
  Care Coordination   Note   03/31/2022 Name: Gail Henry MRN: [id number] DOB: [birth year]  [name] is a 76 y.o. year old female who sees Caryl Bis, MD for primary care. I reached out to [name] by phone today to offer care coordination services.  [name] was given information about Care Coordination services today including:   The Care Coordination services include support from the care team which includes your Nurse Coordinator, Clinical Social Worker, or Pharmacist.  The Care Coordination team is here to help remove barriers to the health concerns and goals most important to you. Care Coordination services are voluntary, and the patient may decline or stop services at any time by request to their care team member.   Care Coordination Consent Status: Patient agreed to services and verbal consent obtained.   Follow up plan:  Telephone appointment with care coordination team member scheduled for:  04/05/22  Encounter Outcome:  Pt. Scheduled  [place]  Direct Dial: [phone]

## 2022-03-31 NOTE — Telephone Encounter (Addendum)
Auth Submission: approved - new PA for AP SITE Payer: [employer]BS Medication & CPT/J Code(s) submitted: Entyvio (Vedolizumab) [id number] Route of submission (phone, fax, portal): COVER MY MEDS Phone # Fax # Auth type: Buy/Bill Units/visits requested: 300MG Q8WKS Reference number: [facility] Approval from: 03/31/22 to 03/30/23   Npi: [id number]  Tax: [phone] Has been confirmed on auth: [place] site

## 2022-04-05 ENCOUNTER — Encounter: Payer: Self-pay | Admitting: *Deleted

## 2022-04-05 ENCOUNTER — Ambulatory Visit: Payer: Self-pay | Admitting: *Deleted

## 2022-04-05 NOTE — Patient Instructions (Signed)
Visit Information  Thank you for taking time to visit with me today. Please don't hesitate to contact me if I can be of assistance to you.  Following are the goals we discussed today:  Consider water therapy exercises.  Please call the Suicide and Crisis Lifeline: 988 call the [place] National Suicide Prevention Lifeline: (331)601-6698 or TTY: 8657733857 TTY 254-588-6524) to talk to a trained counselor call 1-800-273-TALK (toll free, 24 hour hotline) call the [facility]: [phone]) [phone] call 911 if you are experiencing a Mental Health or [place] or need someone to talk to.  Patient verbalizes understanding of instructions and care plan provided today and agrees to view in [place]. Active MyChart status and patient understanding of how to access instructions and care plan via MyChart confirmed with patient.     The patient has been provided with contact information for the care management team and has been advised to call with any health related questions or concerns.   Valente David, RN, MSN, [place] Care Management Care Management Coordinator [phone]

## 2022-04-05 NOTE — Patient Outreach (Signed)
  Care Coordination   Initial Visit Note   04/05/2022 Name: Gail Henry MRN: [id number] DOB: 10/13/1945  Gail Henry is a 76 y.o. year old female who sees Caryl Bis, MD for primary care. I spoke with  [name] by phone today.  What matters to the patients health and wellness today?  Member has history of Crohn's, receives infusions at [facility] routinely.  Often develops ulcers in the mouth as complications, better now.  AWV was done in January this year.  Denies need for further education.     Goals Addressed             This Visit's Progress    COMPLETED: Care Coordination Activities - No follow up needed       Care Coordination Interventions: Patient interviewed about adult health maintenance status including  Colonoscopy    Depression screen    Falls risk assessment    Blood Pressure    Advised patient to discuss  Pneumonia Vaccine Influenza Vaccine COVID vaccination    with primary care provider  Provided education about Scheduling AWV with PCP SDOH reviewed         SDOH assessments and interventions completed:  Yes  SDOH Interventions Today    Flowsheet Row Most Recent Value  SDOH Interventions   Food Insecurity Interventions Intervention Not Indicated  Housing Interventions Intervention Not Indicated  Transportation Interventions Intervention Not Indicated  Utilities Interventions Intervention Not Indicated        Care Coordination Interventions Activated:  Yes  Care Coordination Interventions:  Yes, provided   Follow up plan: No further intervention required.   Encounter Outcome:  Pt. Visit Completed   Valente David, RN, MSN, [place] Care Management Care Management Coordinator [phone]

## 2022-04-12 ENCOUNTER — Encounter: Payer: Self-pay | Admitting: Gastroenterology

## 2022-04-12 ENCOUNTER — Other Ambulatory Visit: Payer: Self-pay

## 2022-04-12 DIAGNOSIS — K50019 Crohn's disease of small intestine with unspecified complications: Secondary | ICD-10-CM

## 2022-04-13 ENCOUNTER — Ambulatory Visit (INDEPENDENT_AMBULATORY_CARE_PROVIDER_SITE_OTHER): Payer: Medicare Other | Admitting: Gastroenterology

## 2022-04-13 ENCOUNTER — Ambulatory Visit: Payer: Medicare Other

## 2022-04-13 ENCOUNTER — Encounter (HOSPITAL_COMMUNITY)
Admission: RE | Admit: 2022-04-13 | Discharge: 2022-04-13 | Disposition: A | Discharge status: Home / Self Care | Payer: Medicare Other | Source: Ambulatory Visit | Attending: Gastroenterology | Admitting: Gastroenterology

## 2022-04-13 DIAGNOSIS — Z01818 Encounter for other preprocedural examination: Secondary | ICD-10-CM | POA: Insufficient documentation

## 2022-04-13 DIAGNOSIS — K50019 Crohn's disease of small intestine with unspecified complications: Secondary | ICD-10-CM | POA: Insufficient documentation

## 2022-04-13 MED ORDER — VEDOLIZUMAB 300 MG IV SOLR
300.0000 mg | Freq: Once | INTRAVENOUS | Status: AC
  Administered 2022-04-13: 300 mg via INTRAVENOUS
  Filled 2022-04-13: qty 5

## 2022-04-13 NOTE — Progress Notes (Signed)
Diagnosis: Crohn's Disease  Provider:  Hildred Laser MD  Procedure: Infusion  IV Type: Peripheral, IV Location: Right lower forearm Entyvio (Vedolizumab), Dose: 300 mg  Infusion Start Time: 8676  Infusion Stop Time: 1118  Post Infusion IV Care: Observation period completed  Discharge: Condition: Good, Destination: Home . AVS provided to patient.   Performed by:  Fraser Din [place], RN

## 2022-05-04 ENCOUNTER — Ambulatory Visit (INDEPENDENT_AMBULATORY_CARE_PROVIDER_SITE_OTHER): Payer: Medicare Other | Admitting: Gastroenterology

## 2022-06-05 ENCOUNTER — Other Ambulatory Visit: Payer: Self-pay

## 2022-06-08 ENCOUNTER — Encounter (HOSPITAL_COMMUNITY)
Admission: RE | Admit: 2022-06-08 | Discharge: 2022-06-08 | Disposition: A | Discharge status: Home / Self Care | Payer: Medicare Other | Source: Ambulatory Visit | Attending: Gastroenterology | Admitting: Gastroenterology

## 2022-06-08 VITALS — BP 115/73 | HR 69 | Temp 98.5°F | Resp 15

## 2022-06-08 DIAGNOSIS — K50019 Crohn's disease of small intestine with unspecified complications: Secondary | ICD-10-CM | POA: Diagnosis not present

## 2022-06-08 MED ORDER — VEDOLIZUMAB 300 MG IV SOLR
300.0000 mg | INTRAVENOUS | Status: DC
  Administered 2022-06-08: 300 mg via INTRAVENOUS
  Filled 2022-06-08: qty 5

## 2022-06-08 NOTE — Addendum Note (Signed)
Encounter addended by: Binnie Kand, RN on: 06/08/2022 11:52 AM  Actions taken: LDA properties accepted

## 2022-06-08 NOTE — Progress Notes (Signed)
Diagnosis: Crohn's Disease  Provider:  Harvel Quale MD  Procedure: Infusion  IV Type: Peripheral, IV Location: R Hand  Entyvio (Vedolizumab), Dose: 300 mg  Infusion Start Time: 7319  Infusion Stop Time: 1048  Post Infusion IV Care: Peripheral IV Discontinued  Discharge: Condition: Stable, Destination: Home . AVS provided to patient.   Performed by:  Binnie Kand, RN

## 2022-06-19 ENCOUNTER — Encounter (INDEPENDENT_AMBULATORY_CARE_PROVIDER_SITE_OTHER): Payer: Self-pay | Admitting: Gastroenterology

## 2022-06-19 ENCOUNTER — Ambulatory Visit (INDEPENDENT_AMBULATORY_CARE_PROVIDER_SITE_OTHER): Payer: Medicare Other | Admitting: Gastroenterology

## 2022-06-19 VITALS — BP 138/83 | HR 75 | Temp 97.5°F | Ht 66.5 in | Wt 175.2 lb

## 2022-06-19 DIAGNOSIS — Z7189 Other specified counseling: Secondary | ICD-10-CM

## 2022-06-19 DIAGNOSIS — K5000 Crohn's disease of small intestine without complications: Secondary | ICD-10-CM

## 2022-06-19 DIAGNOSIS — K219 Gastro-esophageal reflux disease without esophagitis: Secondary | ICD-10-CM

## 2022-06-19 DIAGNOSIS — Z1159 Encounter for screening for other viral diseases: Secondary | ICD-10-CM

## 2022-06-19 DIAGNOSIS — Z111 Encounter for screening for respiratory tuberculosis: Secondary | ICD-10-CM

## 2022-06-19 DIAGNOSIS — K5080 Crohn's disease of both small and large intestine without complications: Secondary | ICD-10-CM

## 2022-06-19 NOTE — Patient Instructions (Addendum)
Schedule colonoscopy Perform blood workup Continue Entyvio every 8 weeks Continue rabeprazole 20 mg qday Can chew gum or use peppermint as needed if presenting dry mouth episodes. Advised to receive flu shot

## 2022-06-19 NOTE — Progress Notes (Unsigned)
Gail Henry, M.D. Gastroenterology & Hepatology [facility] [address] [place], [place] [address]  Primary Care Physician: Caryl Bis, MD [place] [id number]  I will communicate my assessment and recommendations to the referring MD via EMR.  Problems: Ileocolonic Crohn's disease s/p colon resection History of microperforation s/p R hemicolectomy GERD  History of Present Illness: Gail Henry is a 76 y.o. female with a complex medical history of ileocolonic Crohn's disease status post colon resection x2 (once due to microperforation), GERD, who presents for follow up of Crohn's disease.  The patient was last seen on 01/05/2022. At that time, the patient was advised to continue [facility] of every 8 weeks.  She was continued on omeprazole for GERD.  Was also referred for ENT evaluation given burning sensation in her mouth.  The patient reports she is having 1-2 Bms per day with [facility] 4 consistency. Does not take antidiarrheals. The patient denies having any nausea, vomiting, fever, chills, hematochezia, melena, hematemesis, abdominal distention, abdominal pain, diarrhea, jaundice, pruritus or weight loss.  Patient was begun on Entyvio in February 2023.  She received 3 doses for induction and first maintenance dose was on 12/22/2021. Last Entyvio dose was on 06/08/2022.  She had a recent URI, was given a 5 day course of prednisone but has not taken more prednisone for multiple months.  She denies any heartburn or dysphagia. Takes rabeprazole 20 mg qday which she believes helps controlling her GERD.  Previous medications: Humira (had blisters, fever and burning tongue), budesonide, mesalamine  Patient reports her burning mouth symptoms have resolved and no further work-up was ordered by ENT.  Has been concerned about having dry mouth, especially in the morning.  Last Colonoscopy: 05/2020 Skin tags were found on perianal  exam. There was evidence of a prior end-to-side ileo-colonic anastomosis at the hepatic flexure. This was patent and was characterized by edema, erosion and erythema. The anastomosis was not traversed. This was biopsied with a cold forceps for histology. The pathology specimen was placed into Bottle Number 2. Scattered diverticula were found in the sigmoid colon and transverse colon. External hemorrhoids were found during retroflexion.  Path: SMALL BOWEL, BIOPSY:  - Severely active chronic enteritis with ulceration, consistent with  patient's clinical history of Crohn's disease  - Negative for granulomas or dysplasia   Last flu shot:2022 Last pneumonia shot - received in 2014, 2015 Last Pap smear: 2020 - normal, advised not to repeat per GYN Last evaluation by dermatology: <1 year ago Last zoster vaccine: 2020 DEXA scan: scheduled in December   Past Medical History: Past Medical History:  Diagnosis Date   Abdominal pain 10/18/2010   Constipation 10/18/2010   Crohn's disease ([place]) 10/18/2010   SMALL BOWELS   Diarrhea 10/18/2010    Past Surgical History: Past Surgical History:  Procedure Laterality Date   BIOPSY  06/02/2020   Procedure: BIOPSY;  Surgeon: Rogene Houston, MD;  [facility];  Service: Endoscopy;;   COLONOSCOPY  10/16/2001   COLONOSCOPY  03/15/2012   Procedure: COLONOSCOPY;  Surgeon: Rogene Houston, MD;  [facility];  Service: Endoscopy;  Laterality: N/A;  730   COLONOSCOPY WITH PROPOFOL N/A 06/02/2020   Procedure: COLONOSCOPY WITH PROPOFOL;  Surgeon: Rogene Houston, MD;  [facility];  Service: Endoscopy;  Laterality: N/A;  730   ESOPHAGEAL DILATION N/A 06/02/2020   Procedure: ESOPHAGEAL DILATION;  Surgeon: Rogene Houston, MD;  [facility];  Service: Endoscopy;  Laterality: N/A;   ESOPHAGOGASTRODUODENOSCOPY (EGD) WITH PROPOFOL N/A 06/02/2020   Procedure: ESOPHAGOGASTRODUODENOSCOPY (EGD) WITH PROPOFOL;  Surgeon:  Rogene Houston, MD;  [facility];  Service: Endoscopy;  Laterality: N/A;   LAPAROSCOPIC CHOLECYSTECTOMY  12/17/96   DEMASON    Family History: Family History  Problem Relation Age of Onset   Heart disease Father    Healthy Sister    Healthy Brother    Healthy Sister    Healthy Daughter    Healthy Son     Social History: Social History   Tobacco Use  Smoking Status Never   Passive exposure: Never  Smokeless Tobacco Never   Social History   Substance and Sexual Activity  Alcohol Use No   Alcohol/week: 0.0 standard drinks of alcohol   Social History   Substance and Sexual Activity  Drug Use No    Allergies: Allergies  Allergen Reactions   Penicillins Hives   Dexlansoprazole Other (See Comments)    Muscle pain   Esomeprazole Magnesium Nausea Only    Medications: Current Outpatient Medications  Medication Sig Dispense Refill   acetaminophen (TYLENOL) 500 MG tablet Take 500 mg by mouth every 6 (six) hours as needed for moderate pain or headache.     B Complex-C (B-COMPLEX WITH VITAMIN C) tablet Take 1 tablet by mouth daily.     Carboxymethylcellul-Glycerin (REFRESH RELIEVA OP) Place 1-2 drops into both eyes 3 (three) times daily as needed (dry eyes).      Cholecalciferol (VITAMIN D3) 2000 UNITS TABS Take 2,000 Units by mouth daily.     docusate sodium (COLACE) 100 MG capsule Take 2 capsules (200 mg total) by mouth at bedtime. (Patient taking differently: Take 200 mg by mouth as needed.)  0   Multiple Vitamins-Minerals (MULTIVITAMIN WITH MINERALS) tablet Take 1 tablet by mouth. Takes 2 daily     pseudoephedrine-guaifenesin (MUCINEX D) 60-600 MG 12 hr tablet Take 1 tablet by mouth daily at 6 (six) AM.     RABEprazole (ACIPHEX) 20 MG tablet Take 1 tablet (20 mg total) by mouth daily before breakfast. 90 tablet 3   sodium chloride (OCEAN) 0.65 % SOLN nasal spray Place 1 spray into both nostrils daily.     vedolizumab (ENTYVIO) 300 MG injection Inject 300  mg into the vein every 8 (eight) weeks.     No current facility-administered medications for this visit.    Review of Systems: GENERAL: negative for malaise, night sweats HEENT: No changes in hearing or vision, no nose bleeds or other nasal problems. NECK: Negative for lumps, goiter, pain and significant neck swelling RESPIRATORY: Negative for cough, wheezing CARDIOVASCULAR: Negative for chest pain, leg swelling, palpitations, orthopnea GI: SEE HPI MUSCULOSKELETAL: Negative for joint pain or swelling, back pain, and muscle pain. SKIN: Negative for lesions, rash PSYCH: Negative for sleep disturbance, mood disorder and recent psychosocial stressors. HEMATOLOGY Negative for prolonged bleeding, bruising easily, and swollen nodes. ENDOCRINE: Negative for cold or heat intolerance, polyuria, polydipsia and goiter. NEURO: negative for tremor, gait imbalance, syncope and seizures. The remainder of the review of systems is noncontributory.   Physical Exam: BP 138/83 (BP Location: Left Arm, Patient Position: Sitting, Cuff Size: Large)   Pulse 75   Temp (!) 97.5 F (36.4 C) (Temporal)   Ht 5' 6.5" (1.689 m)   Wt 175 lb 3.2 oz (79.5 kg)   BMI 27.85 kg/m  GENERAL: The patient is AO x3, in no acute distress. HEENT: Head is normocephalic  and atraumatic. EOMI are intact. Mouth is well hydrated and without lesions. NECK: Supple. No masses LUNGS: Clear to auscultation. No presence of rhonchi/wheezing/rales. Adequate chest expansion HEART: RRR, normal s1 and s2. ABDOMEN: Soft, nontender, no guarding, no peritoneal signs, and nondistended. BS +. No masses. EXTREMITIES: Without any cyanosis, clubbing, rash, lesions or edema. NEUROLOGIC: AOx3, no focal motor deficit. SKIN: no jaundice, no rashes  Imaging/Labs: as above  I personally reviewed and interpreted the available labs, imaging and endoscopic files.  Impression and Plan: VELISA REGNIER is a 76 y.o. female with a complex medical  history of ileocolonic Crohn's disease status post colon resection x2 (once due to microperforation), GERD, who presents for follow up of Crohn's disease and GERD.  Regarding her Crohn's disease, patient has presented significant improvement of her symptoms after starting Entyvio.  It seems that she has achieved clinical remission based on her current presentation and has not required to use any courses of prednisone for her gastrointestinal disease.  We discussed the importance of making sure she achieves endoscopic remission with a colonoscopy, which will be scheduled today.  She should continue on Entyvio every 8 weeks for now.  I also advised her to obtain her flu shot as part of the preventative measures.  The patient has presented adequate control of her GERD while on low-dose omeprazole which she should continue on a daily basis.  I do not consider her dry mouth and cough episodes are related to GERD as she has achieved control of her esophageal symptoms.  I advised her to use gum or peppermint to improve her dry mouth episodes.  - Schedule colonoscopy -Check CBC, CMP, CRP, QuantiFERON, hepatitis B surface antigen - Continue Entyvio every 8 weeks - Continue rabeprazole 20 mg qday - Can chew gum or use peppermint as needed if presenting dry mouth episodes. - Advised to receive flu shot  - RTC 6 months  All questions were answered.      Gail Peppers, MD Gastroenterology and Hepatology [facility]

## 2022-06-20 ENCOUNTER — Encounter: Payer: Self-pay | Admitting: *Deleted

## 2022-06-20 MED ORDER — SUTAB 1479-225-188 MG PO TABS: ORAL_TABLET | ORAL | 0 refills | Status: DC

## 2022-07-12 LAB — CBC WITH DIFFERENTIAL/PLATELET
Absolute Monocytes: 422 cells/uL (ref 200–950)
Basophils Absolute: 20 cells/uL (ref 0–200)
Basophils Relative: 0.3 %
Eosinophils Absolute: 73 cells/uL (ref 15–500)
Eosinophils Relative: 1.1 %
HCT: 38.1 % (ref 35.0–45.0)
Hemoglobin: 12.9 g/dL (ref 11.7–15.5)
Lymphs Abs: 2006 cells/uL (ref 850–3900)
MCH: 30.1 pg (ref 27.0–33.0)
MCHC: 33.9 g/dL (ref 32.0–36.0)
MCV: 88.8 fL (ref 80.0–100.0)
MPV: 10.1 fL (ref 7.5–12.5)
Monocytes Relative: 6.4 %
Neutro Abs: 4079 cells/uL (ref 1500–7800)
Neutrophils Relative %: 61.8 %
Platelets: 316 10*3/uL (ref 140–400)
RBC: 4.29 10*6/uL (ref 3.80–5.10)
RDW: 13.1 % (ref 11.0–15.0)
Total Lymphocyte: 30.4 %
WBC: 6.6 10*3/uL (ref 3.8–10.8)

## 2022-07-12 LAB — COMPREHENSIVE METABOLIC PANEL
AG Ratio: 1.4 (calc) (ref 1.0–2.5)
ALT: 16 U/L (ref 6–29)
AST: 19 U/L (ref 10–35)
Albumin: 4 g/dL (ref 3.6–5.1)
Alkaline phosphatase (APISO): 91 U/L (ref 37–153)
BUN/Creatinine Ratio: 18 (calc) (ref 6–22)
BUN: 10 mg/dL (ref 7–25)
CO2: 28 mmol/L (ref 20–32)
Calcium: 9.5 mg/dL (ref 8.6–10.4)
Chloride: 105 mmol/L (ref 98–110)
Creat: 0.55 mg/dL — ABNORMAL LOW (ref 0.60–1.00)
Globulin: 2.8 g/dL (calc) (ref 1.9–3.7)
Glucose, Bld: 73 mg/dL (ref 65–99)
Potassium: 4.1 mmol/L (ref 3.5–5.3)
Sodium: 144 mmol/L (ref 135–146)
Total Bilirubin: 0.4 mg/dL (ref 0.2–1.2)
Total Protein: 6.8 g/dL (ref 6.1–8.1)

## 2022-07-12 LAB — C-REACTIVE PROTEIN: CRP: 3 mg/L (ref <8.0)

## 2022-07-12 LAB — QUANTIFERON-TB GOLD PLUS
Mitogen-NIL: >10 IU/mL
NIL: 0.01 IU/mL
QuantiFERON-TB Gold Plus: NEGATIVE
TB1-NIL: 0.01 IU/mL
TB2-NIL: 0.01 IU/mL

## 2022-07-12 LAB — HEPATITIS B SURFACE ANTIGEN: Hepatitis B Surface Ag: NONREACTIVE

## 2022-07-21 ENCOUNTER — Encounter (HOSPITAL_COMMUNITY)
Admission: RE | Disposition: A | Discharge status: Home / Self Care | Payer: Self-pay | Source: Home / Self Care | Attending: Gastroenterology

## 2022-07-21 ENCOUNTER — Ambulatory Visit (HOSPITAL_COMMUNITY): Payer: Medicare Other | Admitting: Certified Registered"

## 2022-07-21 ENCOUNTER — Encounter (HOSPITAL_COMMUNITY): Payer: Self-pay | Admitting: Gastroenterology

## 2022-07-21 ENCOUNTER — Other Ambulatory Visit: Payer: Self-pay

## 2022-07-21 ENCOUNTER — Ambulatory Visit (HOSPITAL_COMMUNITY)
Admission: RE | Admit: 2022-07-21 | Discharge: 2022-07-21 | Disposition: A | Discharge status: Home / Self Care | Payer: Medicare Other | Attending: Gastroenterology | Admitting: Gastroenterology

## 2022-07-21 DIAGNOSIS — K648 Other hemorrhoids: Secondary | ICD-10-CM | POA: Insufficient documentation

## 2022-07-21 DIAGNOSIS — D125 Benign neoplasm of sigmoid colon: Secondary | ICD-10-CM | POA: Diagnosis not present

## 2022-07-21 DIAGNOSIS — K6389 Other specified diseases of intestine: Secondary | ICD-10-CM | POA: Diagnosis not present

## 2022-07-21 DIAGNOSIS — K219 Gastro-esophageal reflux disease without esophagitis: Secondary | ICD-10-CM | POA: Diagnosis not present

## 2022-07-21 DIAGNOSIS — Z9049 Acquired absence of other specified parts of digestive tract: Secondary | ICD-10-CM | POA: Diagnosis not present

## 2022-07-21 DIAGNOSIS — K649 Unspecified hemorrhoids: Secondary | ICD-10-CM

## 2022-07-21 DIAGNOSIS — K56699 Other intestinal obstruction unspecified as to partial versus complete obstruction: Secondary | ICD-10-CM | POA: Diagnosis not present

## 2022-07-21 DIAGNOSIS — Z980 Intestinal bypass and anastomosis status: Secondary | ICD-10-CM | POA: Diagnosis not present

## 2022-07-21 DIAGNOSIS — K633 Ulcer of intestine: Secondary | ICD-10-CM | POA: Diagnosis not present

## 2022-07-21 DIAGNOSIS — K635 Polyp of colon: Secondary | ICD-10-CM | POA: Diagnosis not present

## 2022-07-21 DIAGNOSIS — K50819 Crohn's disease of both small and large intestine with unspecified complications: Secondary | ICD-10-CM

## 2022-07-21 DIAGNOSIS — K5080 Crohn's disease of both small and large intestine without complications: Secondary | ICD-10-CM | POA: Insufficient documentation

## 2022-07-21 DIAGNOSIS — Z1211 Encounter for screening for malignant neoplasm of colon: Secondary | ICD-10-CM | POA: Diagnosis not present

## 2022-07-21 DIAGNOSIS — K9189 Other postprocedural complications and disorders of digestive system: Secondary | ICD-10-CM

## 2022-07-21 DIAGNOSIS — K5730 Diverticulosis of large intestine without perforation or abscess without bleeding: Secondary | ICD-10-CM | POA: Insufficient documentation

## 2022-07-21 HISTORY — PX: HEMOSTASIS CLIP PLACEMENT: SHX6857

## 2022-07-21 HISTORY — PX: COLONOSCOPY WITH PROPOFOL: SHX5780

## 2022-07-21 HISTORY — PX: POLYPECTOMY: SHX149

## 2022-07-21 LAB — HM COLONOSCOPY

## 2022-07-21 SURGERY — COLONOSCOPY WITH PROPOFOL
Anesthesia: General

## 2022-07-21 MED ORDER — PHENYLEPHRINE 80 MCG/ML (10ML) SYRINGE FOR IV PUSH (FOR BLOOD PRESSURE SUPPORT)
PREFILLED_SYRINGE | INTRAVENOUS | Status: DC | PRN
  Administered 2022-07-21: 80 ug via INTRAVENOUS

## 2022-07-21 MED ORDER — LACTATED RINGERS IV SOLN: INTRAVENOUS | Status: DC | PRN

## 2022-07-21 MED ORDER — PROPOFOL 10 MG/ML IV BOLUS
INTRAVENOUS | Status: DC | PRN
  Administered 2022-07-21: 90 mg via INTRAVENOUS

## 2022-07-21 MED ORDER — PROPOFOL 500 MG/50ML IV EMUL
INTRAVENOUS | Status: DC | PRN
  Administered 2022-07-21: 200 ug/kg/min via INTRAVENOUS

## 2022-07-21 MED ORDER — LIDOCAINE HCL (CARDIAC) PF 100 MG/5ML IV SOSY
PREFILLED_SYRINGE | INTRAVENOUS | Status: DC | PRN
  Administered 2022-07-21: 50 mg via INTRATRACHEAL

## 2022-07-21 MED ORDER — LACTATED RINGERS IV SOLN: INTRAVENOUS | Status: DC

## 2022-07-21 MED ORDER — STERILE WATER FOR IRRIGATION IR SOLN
Status: DC | PRN
  Administered 2022-07-21: 60 mL

## 2022-07-21 NOTE — Transfer of Care (Signed)
Immediate Anesthesia Transfer of Care Note  Patient: Gail Henry  Procedure(s) Performed: COLONOSCOPY WITH PROPOFOL POLYPECTOMY INTESTINAL HEMOSTASIS CLIP PLACEMENT  Patient Location: Endoscopy Unit  Anesthesia Type:General  Level of Consciousness: awake, alert , oriented, and patient cooperative  Airway & Oxygen Therapy: Patient Spontanous Breathing  Post-op Assessment: Report given to RN, Post -op Vital signs reviewed and stable, and Patient moving all extremities  Post vital signs: Reviewed and stable  Last Vitals:  Vitals Value Taken Time  BP    Temp 36.4 C 07/21/22 0946  Pulse 69 07/21/22 0946  Resp 16 07/21/22 0946  SpO2 100 % 07/21/22 0946    Last Pain:  Vitals:   07/21/22 0946  TempSrc: Oral  PainSc: 0-No pain      Patients Stated Pain Goal: 9 (65/48/68 8520)  Complications: No notable events documented.

## 2022-07-21 NOTE — Anesthesia Postprocedure Evaluation (Signed)
Anesthesia Post Note  Patient: CAMBRE MATSON  Procedure(s) Performed: COLONOSCOPY WITH PROPOFOL POLYPECTOMY INTESTINAL HEMOSTASIS CLIP PLACEMENT  Patient location during evaluation: Phase II Anesthesia Type: General Level of consciousness: awake Pain management: pain level controlled Vital Signs Assessment: post-procedure vital signs reviewed and stable Respiratory status: spontaneous breathing and respiratory function stable Cardiovascular status: blood pressure returned to baseline and stable Postop Assessment: no headache and no apparent nausea or vomiting Anesthetic complications: no Comments: Late entry   No notable events documented.   Last Vitals:  Vitals:   07/21/22 0810 07/21/22 0946  BP: (!) 149/78 (!) 98/56  Pulse: 87 69  Resp: 16 16  Temp: 37 C (!) 36.4 C  SpO2: 97% 100%    Last Pain:  Vitals:   07/21/22 0946  TempSrc: Oral  PainSc: 0-No pain                 Louann Sjogren

## 2022-07-21 NOTE — H&P (Signed)
Gail Henry is an 76 y.o. female.   Chief Complaint: Crohn's ileocolitis HPI: Gail Henry is a 76 y.o. female with a complex medical history of ileocolonic Crohn's disease status post colon resection x2 (once due to microperforation), GERD, who presents for follow up of Crohn's disease.   The patient denies having any nausea, vomiting, fever, chills, hematochezia, melena, hematemesis, abdominal distention, abdominal pain, diarrhea, jaundice, pruritus or weight loss.  Past Medical History:  Diagnosis Date   Abdominal pain 10/18/2010   Constipation 10/18/2010   Crohn's disease ([place]) 10/18/2010   SMALL BOWELS   Diarrhea 10/18/2010    Past Surgical History:  Procedure Laterality Date   BIOPSY  06/02/2020   Procedure: BIOPSY;  Surgeon: Rogene Houston, MD;  [facility];  Service: Endoscopy;;   COLONOSCOPY  10/16/2001   COLONOSCOPY  03/15/2012   Procedure: COLONOSCOPY;  Surgeon: Rogene Houston, MD;  [facility];  Service: Endoscopy;  Laterality: N/A;  730   COLONOSCOPY WITH PROPOFOL N/A 06/02/2020   Procedure: COLONOSCOPY WITH PROPOFOL;  Surgeon: Rogene Houston, MD;  [facility];  Service: Endoscopy;  Laterality: N/A;  730   ESOPHAGEAL DILATION N/A 06/02/2020   Procedure: ESOPHAGEAL DILATION;  Surgeon: Rogene Houston, MD;  [facility];  Service: Endoscopy;  Laterality: N/A;   ESOPHAGOGASTRODUODENOSCOPY (EGD) WITH PROPOFOL N/A 06/02/2020   Procedure: ESOPHAGOGASTRODUODENOSCOPY (EGD) WITH PROPOFOL;  Surgeon: Rogene Houston, MD;  [facility];  Service: Endoscopy;  Laterality: N/A;   LAPAROSCOPIC CHOLECYSTECTOMY  12/17/96   DEMASON    Family History  Problem Relation Age of Onset   Heart disease Father    Healthy Sister    Healthy Brother    Healthy Sister    Healthy Daughter    Healthy Son    Social History:  reports that she has never smoked. She has never been exposed to tobacco smoke. She has never used  smokeless tobacco. She reports that she does not drink alcohol and does not use drugs.  Allergies:  Allergies  Allergen Reactions   Penicillins Hives   Dexilant [Dexlansoprazole] Other (See Comments)    Muscle pain   Nexium [Esomeprazole Magnesium] Nausea Only    Medications Prior to Admission  Medication Sig Dispense Refill   B Complex-C (B-COMPLEX WITH VITAMIN C) tablet Take 1 tablet by mouth daily.     Carboxymethylcellul-Glycerin (REFRESH RELIEVA OP) Place 1-2 drops into both eyes 3 (three) times daily as needed (dry/irritated eyes.).     Cholecalciferol (VITAMIN D3) 2000 UNITS TABS Take 2,000 Units by mouth in the morning.     Multiple Vitamins-Minerals (MULTIVITAMIN WITH MINERALS) tablet Take 2 tablets by mouth in the morning. forbia     nystatin-triamcinolone (MYCOLOG II) cream Apply 1 Application topically 2 (two) times daily as needed (skin irritation (skin folds)).     RABEprazole (ACIPHEX) 20 MG tablet Take 1 tablet (20 mg total) by mouth daily before breakfast. 90 tablet 3   sodium chloride (OCEAN) 0.65 % SOLN nasal spray Place 1 spray into both nostrils in the morning and at bedtime.     Sodium Sulfate-Mag Sulfate-KCl (SUTAB) [id number] MG TABS As directed 24 tablet 0   vedolizumab (ENTYVIO) 300 MG injection Inject 300 mg into the vein every 8 (eight) weeks.     acetaminophen (TYLENOL) 500 MG tablet Take 500 mg by mouth every 6 (six) hours as needed for moderate pain or headache.     docusate sodium (COLACE)  100 MG capsule Take 2 capsules (200 mg total) by mouth at bedtime. (Patient taking differently: Take 200 mg by mouth daily as needed (constipation.).)  0    No results found for this or any previous visit (from the past 48 hour(s)). No results found.  Review of Systems  All other systems reviewed and are negative.   Blood pressure (!) 149/78, pulse 87, temperature 98.6 F (37 C), temperature source Oral, resp. rate 16, height 5' 6.5" (1.689 m), weight 77.1 kg,  SpO2 97 %. Physical Exam  GENERAL: The patient is AO x3, in no acute distress. HEENT: Head is normocephalic and atraumatic. EOMI are intact. Mouth is well hydrated and without lesions. NECK: Supple. No masses LUNGS: Clear to auscultation. No presence of rhonchi/wheezing/rales. Adequate chest expansion HEART: RRR, normal s1 and s2. ABDOMEN: Soft, nontender, no guarding, no peritoneal signs, and nondistended. BS +. No masses. EXTREMITIES: Without any cyanosis, clubbing, rash, lesions or edema. NEUROLOGIC: AOx3, no focal motor deficit. SKIN: no jaundice, no rashes  Assessment/Plan Gail Henry is a 76 y.o. female with a complex medical history of ileocolonic Crohn's disease status post colon resection x2 (once due to microperforation), GERD, who presents for follow up of Crohn's disease.  Will proceed with colonoscopy.  Harvel Quale, MD 07/21/2022, 8:56 AM

## 2022-07-21 NOTE — Op Note (Signed)
[facility] Patient Name: Gail Henry Procedure Date: 07/21/2022 9:01 AM MRN: [id number] Date of Birth: 11-Jun-1946 Attending MD: [name] , , [id number] CSN: [id number] Age: 76 Admit Type: Outpatient Procedure:                Colonoscopy Indications:              Follow-up of Crohn's disease of the small bowel Providers:                [name], Crystal Page, Raphael Gibney,                            Technician Referring MD:              Medicines:                Monitored Anesthesia Care Complications:            No immediate complications. Estimated Blood Loss:     Estimated blood loss: none. Procedure:                Pre-Anesthesia Assessment:                           - Prior to the procedure, a History and Physical                            was performed, and patient medications, allergies                            and sensitivities were reviewed. The patient's                            tolerance of previous anesthesia was reviewed.                           - The risks and benefits of the procedure and the                            sedation options and risks were discussed with the                            patient. All questions were answered and informed                            consent was obtained.                           - ASA Grade Assessment: III - A patient with severe                            systemic disease.                           After obtaining informed consent, the colonoscope                            was passed under direct vision. Throughout the  procedure, the patient's blood pressure, pulse, and                            oxygen saturations were monitored continuously. The                            [id number]-HQ190L ([id number]) scope was introduced through                            the anus and advanced to the the ileocolonic                            anastomosis. The colonoscopy was performed  without                            difficulty. The patient tolerated the procedure                            well. The quality of the bowel preparation was                            excellent. Scope In: 9:12:55 AM Scope Out: 9:44:04 AM Scope Withdrawal Time: 0 hours 25 minutes 34 seconds  Total Procedure Duration: 0 hours 31 minutes 9 seconds  Findings:      The perianal and digital rectal examinations were normal.      The neo-terminal ileum contained five small 3-4 mm ulcers. No bleeding       was present. This was consistent with Rutgeers i1 classification.       Inspection of more proximal involvement was limited by anastomotic       stricture.      There was evidence of a prior end-to-side ileo-colonic anastomosis in       the ascending colon. This was non-patent and was characterized by       stenosis 1 cm (inner diameter) x 0.5 cm (length). The anastomosis could       not be traversed. This likely corresponds to an anastomotic stricture.      An 8 mm polyp was found in the anastomosis. The polyp was sessile. The       polyp was removed with a cold snare. Resection and retrieval were       complete. For hemostasis, one hemostatic clip was successfully placed.       Clip manufacturer: [employer]. There was no bleeding at the end       of the procedure.      A 12 mm polyp was found in the sigmoid colon. The polyp was sessile. The       polyp was removed with a hot snare. Resection and retrieval were       complete. To prevent bleeding after the polypectomy, one hemostatic clip       was successfully placed. Clip manufacturer: [employer]. There was       no bleeding at the end of the procedure.      Scattered large-mouthed and small-mouthed diverticula were found in the       sigmoid colon and descending colon.      An area of a mucosal bridge with intraluminal fistula was present in the  sigmoid. This was completely healed and no inflammation was present.       Non-bleeding hemorrhoids were found during retroflexion. The hemorrhoids       were small.      Note: Patient in endoscopic remission at the moment, Rutgeers i1. Impression:               - Five small 3-4 mm ulcers in the neo-terminal                            ileum.                           - Non-patent end-to-side ileo-colonic anastomosis,                            characterized by stenosis.                           - One 8 mm polyp at the anastomosis, removed with a                            cold snare. Resected and retrieved. Clip was                            placed. Clip manufacturer: [employer].                           - One 12 mm polyp in the sigmoid colon, removed                            with a hot snare. Resected and retrieved. Clip was                            placed. Clip manufacturer: [employer].                           - Diverticulosis in the sigmoid colon and in the                            descending colon.                           - Nodular mucosa in the sigmoid colon.                           - Non-bleeding hemorrhoids. Moderate Sedation:      Per Anesthesia Care Recommendation:           - Discharge patient to home (ambulatory).                           - Resume previous diet.                           - Await pathology results.                           -  Repeat colonoscopy in 2 years for surveillance.                           - Continue Entyvio every 8 weeks.                           - If worsening symptoms, will need to check Entyvio                            levels in the future. Procedure Code(s):        --- Professional ---                           [phone], Colonoscopy, flexible; with removal of                            tumor(s), polyp(s), or other lesion(s) by snare                            technique Diagnosis Code(s):        --- Professional ---                           K63.3, Ulcer of intestine                            K91.89, Other postprocedural complications and                            disorders of digestive system                           D12.6, Benign neoplasm of colon, unspecified                           D12.5, Benign neoplasm of sigmoid colon                           K64.9, Unspecified hemorrhoids                           K63.89, Other specified diseases of intestine                           K50.00, Crohn's disease of small intestine without                            complications                           K57.30, Diverticulosis of large intestine without                            perforation or abscess without bleeding CPT copyright 2022 American [employer] Association. All rights reserved. The codes documented in this report are preliminary and upon coder review may  be revised to meet current compliance requirements. [name], MD Quillian Quince  Jenetta Downer,  07/21/2022 10:02:14 AM This report has been signed electronically. Number of Addenda: 0

## 2022-07-21 NOTE — Discharge Instructions (Addendum)
You are being discharged to home.  Resume your previous diet.  We are waiting for your pathology results.  Your physician has recommended a repeat colonoscopy in two years for surveillance.  Continue Entyvio every 8 weeks. If worsening symptoms, will need to check Entyvio levels in the future.

## 2022-07-21 NOTE — Anesthesia Preprocedure Evaluation (Signed)
Anesthesia Evaluation  Patient identified by MRN, date of birth, ID band Patient awake    Reviewed: Allergy & Precautions, H&P , NPO status , Patient's Chart, lab work & pertinent test results, reviewed documented beta blocker date and time   Airway Mallampati: II  TM Distance: >3 FB Neck ROM: full    Dental no notable dental hx.    Pulmonary neg pulmonary ROS   Pulmonary exam normal breath sounds clear to auscultation       Cardiovascular Exercise Tolerance: Good negative cardio ROS  Rhythm:regular Rate:Normal     Neuro/Psych negative neurological ROS  negative psych ROS   GI/Hepatic Neg liver ROS,GERD  Medicated,,  Endo/Other  negative endocrine ROS    Renal/GU negative Renal ROS  negative genitourinary   Musculoskeletal   Abdominal   Peds  Hematology negative hematology ROS (+)   Anesthesia Other Findings   Reproductive/Obstetrics negative OB ROS                             Anesthesia Physical Anesthesia Plan  ASA: 2  Anesthesia Plan: General   Post-op Pain Management:    Induction:   PONV Risk Score and Plan: Propofol infusion  Airway Management Planned:   Additional Equipment:   Intra-op Plan:   Post-operative Plan:   Informed Consent: I have reviewed the patients History and Physical, chart, labs and discussed the procedure including the risks, benefits and alternatives for the proposed anesthesia with the patient or authorized representative who has indicated his/her understanding and acceptance.     Dental Advisory Given  Plan Discussed with: CRNA  Anesthesia Plan Comments:        Anesthesia Quick Evaluation

## 2022-07-25 LAB — SURGICAL PATHOLOGY

## 2022-07-26 ENCOUNTER — Encounter (INDEPENDENT_AMBULATORY_CARE_PROVIDER_SITE_OTHER): Payer: Self-pay | Admitting: *Deleted

## 2022-08-02 ENCOUNTER — Other Ambulatory Visit: Payer: Self-pay | Admitting: Pharmacy Technician

## 2022-08-02 ENCOUNTER — Encounter (HOSPITAL_COMMUNITY): Payer: Self-pay | Admitting: Gastroenterology

## 2022-08-03 ENCOUNTER — Encounter (HOSPITAL_COMMUNITY)
Admission: RE | Admit: 2022-08-03 | Discharge: 2022-08-03 | Disposition: A | Discharge status: Home / Self Care | Payer: Medicare Other | Source: Ambulatory Visit | Attending: Gastroenterology | Admitting: Gastroenterology

## 2022-08-03 VITALS — BP 122/76 | HR 58 | Temp 98.5°F

## 2022-08-03 DIAGNOSIS — K50019 Crohn's disease of small intestine with unspecified complications: Secondary | ICD-10-CM | POA: Diagnosis present

## 2022-08-03 MED ORDER — VEDOLIZUMAB 300 MG IV SOLR
300.0000 mg | INTRAVENOUS | Status: DC
  Administered 2022-08-03: 300 mg via INTRAVENOUS
  Filled 2022-08-03: qty 5

## 2022-08-03 NOTE — Progress Notes (Signed)
Diagnosis: Crohn's Disease  Provider:  Harvel Quale MD  Procedure: Infusion  IV Type: Peripheral, IV Location: L Hand  Entyvio (Vedolizumab), Dose: 300 mg  Infusion Start Time: 10:22  Infusion Stop Time: 3343  Post Infusion IV Care: Peripheral IV Discontinued  Discharge: Condition: Good, Destination: Home . AVS provided to patient.   Performed by:  Grayland Ormond, RN

## 2022-09-27 ENCOUNTER — Other Ambulatory Visit: Payer: Self-pay

## 2022-09-28 ENCOUNTER — Encounter (HOSPITAL_COMMUNITY)
Admission: RE | Admit: 2022-09-28 | Discharge: 2022-09-28 | Disposition: A | Discharge status: Home / Self Care | Payer: Medicare Other | Source: Ambulatory Visit | Attending: Gastroenterology | Admitting: Gastroenterology

## 2022-09-28 VITALS — BP 135/74 | HR 62 | Temp 98.0°F | Resp 16

## 2022-09-28 DIAGNOSIS — K5080 Crohn's disease of both small and large intestine without complications: Secondary | ICD-10-CM | POA: Diagnosis present

## 2022-09-28 DIAGNOSIS — K50019 Crohn's disease of small intestine with unspecified complications: Secondary | ICD-10-CM

## 2022-09-28 MED ORDER — VEDOLIZUMAB 300 MG IV SOLR
300.0000 mg | INTRAVENOUS | Status: DC
  Administered 2022-09-28: 300 mg via INTRAVENOUS
  Filled 2022-09-28: qty 5

## 2022-09-28 NOTE — Progress Notes (Signed)
Diagnosis: Crohn's Disease  Provider:  Harvel Quale MD  Procedure: Infusion  IV Type: Peripheral, IV Location: R Hand  Entyvio (Vedolizumab), Dose: 300 mg  Infusion Start Time: [id number]  Infusion Stop Time: [id number]  Post Infusion IV Care: Patient declined observation and Peripheral IV Discontinued  Discharge: Condition: Stable, Destination: Home . AVS Provided  Performed by:  Binnie Kand, RN

## 2022-10-30 ENCOUNTER — Emergency Department (HOSPITAL_COMMUNITY): Payer: Medicare Other

## 2022-10-30 ENCOUNTER — Encounter (HOSPITAL_COMMUNITY): Payer: Self-pay

## 2022-10-30 ENCOUNTER — Inpatient Hospital Stay (HOSPITAL_COMMUNITY)
Admission: EM | Admit: 2022-10-30 | Discharge: 2022-11-01 | DRG: 661 | Disposition: A | Discharge status: Home / Self Care | Payer: Medicare Other | Attending: Family Medicine | Admitting: Family Medicine

## 2022-10-30 ENCOUNTER — Other Ambulatory Visit: Payer: Self-pay

## 2022-10-30 DIAGNOSIS — Z9049 Acquired absence of other specified parts of digestive tract: Secondary | ICD-10-CM

## 2022-10-30 DIAGNOSIS — D72828 Other elevated white blood cell count: Secondary | ICD-10-CM | POA: Diagnosis present

## 2022-10-30 DIAGNOSIS — E876 Hypokalemia: Secondary | ICD-10-CM | POA: Diagnosis present

## 2022-10-30 DIAGNOSIS — N200 Calculus of kidney: Secondary | ICD-10-CM | POA: Diagnosis not present

## 2022-10-30 DIAGNOSIS — Z8249 Family history of ischemic heart disease and other diseases of the circulatory system: Secondary | ICD-10-CM

## 2022-10-30 DIAGNOSIS — K219 Gastro-esophageal reflux disease without esophagitis: Secondary | ICD-10-CM | POA: Diagnosis present

## 2022-10-30 DIAGNOSIS — N179 Acute kidney failure, unspecified: Secondary | ICD-10-CM | POA: Diagnosis present

## 2022-10-30 DIAGNOSIS — N132 Hydronephrosis with renal and ureteral calculous obstruction: Secondary | ICD-10-CM | POA: Diagnosis not present

## 2022-10-30 DIAGNOSIS — Z880 Allergy status to penicillin: Secondary | ICD-10-CM

## 2022-10-30 HISTORY — DX: Calculus of kidney: N20.0

## 2022-10-30 HISTORY — DX: Diverticulosis of intestine, part unspecified, without perforation or abscess without bleeding: K57.90

## 2022-10-30 LAB — CBC WITH DIFFERENTIAL/PLATELET
Abs Immature Granulocytes: 0.06 10*3/uL (ref 0.00–0.07)
Basophils Absolute: 0 10*3/uL (ref 0.0–0.1)
Basophils Relative: 0 %
Eosinophils Absolute: 0.1 10*3/uL (ref 0.0–0.5)
Eosinophils Relative: 0 %
HCT: 39.9 % (ref 36.0–46.0)
Hemoglobin: 13.4 g/dL (ref 12.0–15.0)
Immature Granulocytes: 1 %
Lymphocytes Relative: 14 %
Lymphs Abs: 1.6 10*3/uL (ref 0.7–4.0)
MCH: 29.9 pg (ref 26.0–34.0)
MCHC: 33.6 g/dL (ref 30.0–36.0)
MCV: 89.1 fL (ref 80.0–100.0)
Monocytes Absolute: 0.7 10*3/uL (ref 0.1–1.0)
Monocytes Relative: 6 %
Neutro Abs: 8.9 10*3/uL — ABNORMAL HIGH (ref 1.7–7.7)
Neutrophils Relative %: 79 %
Platelets: 301 10*3/uL (ref 150–400)
RBC: 4.48 MIL/uL (ref 3.87–5.11)
RDW: 13.1 % (ref 11.5–15.5)
WBC: 11.3 10*3/uL — ABNORMAL HIGH (ref 4.0–10.5)
nRBC: 0 % (ref 0.0–0.2)

## 2022-10-30 LAB — COMPREHENSIVE METABOLIC PANEL
ALT: 30 U/L (ref 0–44)
AST: 44 U/L — ABNORMAL HIGH (ref 15–41)
Albumin: 3.8 g/dL (ref 3.5–5.0)
Alkaline Phosphatase: 87 U/L (ref 38–126)
Anion gap: 9 (ref 5–15)
BUN: 12 mg/dL (ref 8–23)
CO2: 24 mmol/L (ref 22–32)
Calcium: 8.8 mg/dL — ABNORMAL LOW (ref 8.9–10.3)
Chloride: 104 mmol/L (ref 98–111)
Creatinine, Ser: 0.88 mg/dL (ref 0.44–1.00)
GFR, Estimated: >60 mL/min (ref >60)
Glucose, Bld: 127 mg/dL — ABNORMAL HIGH (ref 70–99)
Potassium: 3.4 mmol/L — ABNORMAL LOW (ref 3.5–5.1)
Sodium: 137 mmol/L (ref 135–145)
Total Bilirubin: 0.6 mg/dL (ref 0.3–1.2)
Total Protein: 7.2 g/dL (ref 6.5–8.1)

## 2022-10-30 LAB — SEDIMENTATION RATE: Sed Rate: 28 mm/hr — ABNORMAL HIGH (ref 0–22)

## 2022-10-30 MED ORDER — IOHEXOL 300 MG/ML  SOLN
100.0000 mL | Freq: Once | INTRAMUSCULAR | Status: AC | PRN
  Administered 2022-10-30: 100 mL via INTRAVENOUS

## 2022-10-30 MED ORDER — MORPHINE SULFATE (PF) 4 MG/ML IV SOLN
4.0000 mg | Freq: Once | INTRAVENOUS | Status: AC
  Administered 2022-10-30: 4 mg via INTRAVENOUS
  Filled 2022-10-30: qty 1

## 2022-10-30 MED ORDER — ONDANSETRON HCL 4 MG/2ML IJ SOLN
4.0000 mg | Freq: Once | INTRAMUSCULAR | Status: AC
  Administered 2022-10-30: 4 mg via INTRAVENOUS
  Filled 2022-10-30: qty 2

## 2022-10-30 MED ORDER — KETOROLAC TROMETHAMINE 15 MG/ML IJ SOLN
15.0000 mg | Freq: Once | INTRAMUSCULAR | Status: AC
  Administered 2022-10-30: 15 mg via INTRAVENOUS
  Filled 2022-10-30: qty 1

## 2022-10-30 NOTE — ED Triage Notes (Signed)
Left flank pain since last week.

## 2022-10-30 NOTE — ED Provider Notes (Signed)
[facility] Provider Note  CSN: [id number] Arrival date & time: 10/30/22 1905  Chief Complaint(s) Flank Pain  HPI Gail Henry is a 77 y.o. female with PMH Crohn's disease, diverticulitis, nephrolithiasis who presents emergency room for evaluation of left flank and abdominal pain.  Patient states that 4 days ago she started to feel significant pain in the left flank and left lower quadrant and transitioned herself to a liquid diet because she thought that this was secondary to diverticulitis.  She states that this initially improved but today at 4 PM her symptoms significantly worsened abruptly and are associated with nausea.  Denies chest pain, shortness of breath, headache, fever or other systemic symptoms.   Past Medical History Past Medical History:  Diagnosis Date   Abdominal pain 10/18/2010   Constipation 10/18/2010   Crohn's disease 10/18/2010   SMALL BOWELS   Diarrhea 10/18/2010   Diverticulosis    Kidney stone on left side    Patient Active Problem List   Diagnosis Date Noted   Burning sensation of mouth 01/05/2022   Crohn's disease, small intestine 10/31/2021   Osteopenia 10/26/2020   Crohn's disease of both small and large intestine 04/27/2020   Esophageal dysphagia 04/27/2020   History of colonic polyps 10/21/2019   Abdominal pain, epigastric 02/17/2013   Nausea 02/17/2013   GERD (gastroesophageal reflux disease) 08/22/2011   Fracture of rib of right side 08/22/2011   Constipation 10/18/2010   Home Medication(s) Prior to Admission medications   Medication Sig Start Date End Date Taking? Authorizing Provider  acetaminophen (TYLENOL) 500 MG tablet Take 500 mg by mouth every 6 (six) hours as needed for moderate pain or headache.    [provider]  B Complex-C (B-COMPLEX WITH VITAMIN C) tablet Take 1 tablet by mouth daily. 11/24/21   Rehman, Mechele Dawley, MD  Carboxymethylcellul-Glycerin (REFRESH RELIEVA OP) Place  1-2 drops into both eyes 3 (three) times daily as needed (dry/irritated eyes.).    [provider]  Cholecalciferol (VITAMIN D3) 2000 UNITS TABS Take 2,000 Units by mouth in the morning.    [provider]  docusate sodium (COLACE) 100 MG capsule Take 2 capsules (200 mg total) by mouth at bedtime. Patient taking differently: Take 200 mg by mouth daily as needed (constipation.). 11/24/21   Rogene Houston, MD  Multiple Vitamins-Minerals (MULTIVITAMIN WITH MINERALS) tablet Take 2 tablets by mouth in the morning. forbia    [provider]  nystatin-triamcinolone (MYCOLOG II) cream Apply 1 Application topically 2 (two) times daily as needed (skin irritation (skin folds)).    [provider]  RABEprazole (ACIPHEX) 20 MG tablet Take 1 tablet (20 mg total) by mouth daily before breakfast. 11/24/21   Rehman, Mechele Dawley, MD  sodium chloride (OCEAN) 0.65 % SOLN nasal spray Place 1 spray into both nostrils in the morning and at bedtime.    [provider]  vedolizumab (ENTYVIO) 300 MG injection Inject 300 mg into the vein every 8 (eight) weeks.    [provider]  Past Surgical History Past Surgical History:  Procedure Laterality Date   BIOPSY  06/02/2020   Procedure: BIOPSY;  Surgeon: Rogene Houston, MD;  [facility];  Service: Endoscopy;;   COLONOSCOPY  10/16/2001   COLONOSCOPY  03/15/2012   Procedure: COLONOSCOPY;  Surgeon: Rogene Houston, MD;  [facility];  Service: Endoscopy;  Laterality: N/A;  730   COLONOSCOPY WITH PROPOFOL N/A 06/02/2020   Procedure: COLONOSCOPY WITH PROPOFOL;  Surgeon: Rogene Houston, MD;  [facility];  Service: Endoscopy;  Laterality: N/A;  730   COLONOSCOPY WITH PROPOFOL N/A 07/21/2022   Procedure: COLONOSCOPY WITH PROPOFOL;  Surgeon: Harvel Quale,  MD;  [facility];  Service: Gastroenterology;  Laterality: N/A;  9:45am, asa 1-2   ESOPHAGEAL DILATION N/A 06/02/2020   Procedure: ESOPHAGEAL DILATION;  Surgeon: Rogene Houston, MD;  [facility];  Service: Endoscopy;  Laterality: N/A;   ESOPHAGOGASTRODUODENOSCOPY (EGD) WITH PROPOFOL N/A 06/02/2020   Procedure: ESOPHAGOGASTRODUODENOSCOPY (EGD) WITH PROPOFOL;  Surgeon: Rogene Houston, MD;  [facility];  Service: Endoscopy;  Laterality: N/A;   HEMOSTASIS CLIP PLACEMENT  07/21/2022   Procedure: HEMOSTASIS CLIP PLACEMENT;  Surgeon: Harvel Quale, MD;  [facility];  Service: Gastroenterology;;   LAPAROSCOPIC CHOLECYSTECTOMY  12/17/96   DEMASON   POLYPECTOMY  07/21/2022   Procedure: POLYPECTOMY INTESTINAL;  Surgeon: Harvel Quale, MD;  [facility];  Service: Gastroenterology;;   Family History Family History  Problem Relation Age of Onset   Heart disease Father    Healthy Sister    Healthy Brother    Healthy Sister    Healthy Daughter    Healthy Son     Social History Social History   Tobacco Use   Smoking status: Never    Passive exposure: Never   Smokeless tobacco: Never  Vaping Use   Vaping Use: Never used  Substance Use Topics   Alcohol use: No    Alcohol/week: 0.0 standard drinks of alcohol   Drug use: No   Allergies Penicillins, Dexilant [dexlansoprazole], and Nexium [esomeprazole magnesium]  Review of Systems Review of Systems  Gastrointestinal:  Positive for abdominal pain.  Genitourinary:  Positive for flank pain.    Physical Exam Vital Signs  I have reviewed the triage vital signs BP (!) 157/76 (BP Location: Right Arm)   Pulse 80   Temp 99.1 F (37.3 C)   Resp 20   SpO2 98%   Physical Exam Vitals and nursing note reviewed.  Constitutional:      General: She is not in acute distress.    Appearance: She is well-developed.  HENT:     Head: Normocephalic and atraumatic.   Eyes:     Conjunctiva/sclera: Conjunctivae normal.  Cardiovascular:     Rate and Rhythm: Normal rate and regular rhythm.     Heart sounds: No murmur heard. Pulmonary:     Effort: Pulmonary effort is normal. No respiratory distress.     Breath sounds: Normal breath sounds.  Abdominal:     Palpations: Abdomen is soft.     Tenderness: There is abdominal tenderness. There is left CVA tenderness.  Musculoskeletal:        General: No swelling.     Cervical back: Neck supple.  Skin:    General: Skin is warm and dry.     Capillary Refill: Capillary refill takes less than 2 seconds.  Neurological:     Mental Status: She is  alert.  Psychiatric:        Mood and Affect: Mood normal.     ED Results and Treatments Labs (all labs ordered are listed, but only abnormal results are displayed) Labs Reviewed  COMPREHENSIVE METABOLIC PANEL - Abnormal; Notable for the following components:      Result Value   Potassium 3.4 (*)    Glucose, Bld 127 (*)    Calcium 8.8 (*)    AST 44 (*)    All other components within normal limits  CBC WITH DIFFERENTIAL/PLATELET - Abnormal; Notable for the following components:   WBC 11.3 (*)    Neutro Abs 8.9 (*)    All other components within normal limits  URINALYSIS, ROUTINE W REFLEX MICROSCOPIC  SEDIMENTATION RATE  C-REACTIVE PROTEIN                                                                                                                          Radiology No results found.  Pertinent labs & imaging results that were available during my care of the patient were reviewed by me and considered in my medical decision making (see MDM for details).  Medications Ordered in ED Medications  ketorolac (TORADOL) 15 MG/ML injection 15 mg (has no administration in time range)  morphine (PF) 4 MG/ML injection 4 mg (has no administration in time range)  ondansetron (ZOFRAN) injection 4 mg (has no administration in time range)                                                                                                                                      Procedures Procedures  (including critical care time)  Medical Decision Making / ED Course   This patient presents to the ED for concern of flank and abdominal pain, this involves an extensive number of treatment options, and is a complaint that carries with it a high risk of complications and morbidity.  The differential diagnosis includes nephrolithiasis, pyelonephritis, Crohn's flare, diverticulitis, obstruction, abscess  MDM: Patient seen emergency room for evaluation of flank and abdominal pain.  Physical exam with significant left-sided CVA tenderness and left lower quadrant tenderness to palpation.  Laboratory evaluation with a mild leukocytosis to 11.3, hypokalemia to 3.4, CRP normal, sed rate 28.  Patient pain controlled and at time of signout is pending CT abdomen pelvis.  Please see provider signout for continuation of workup.  Additional history obtained: -Additional history obtained from granddaughter -External records from outside source obtained and reviewed including: Chart review including previous notes, labs, imaging, consultation notes   Lab Tests: -I ordered, reviewed, and interpreted labs.   The pertinent results include:   Labs Reviewed  COMPREHENSIVE METABOLIC PANEL - Abnormal; Notable for the following components:      Result Value   Potassium 3.4 (*)    Glucose, Bld 127 (*)    Calcium 8.8 (*)    AST 44 (*)    All other components within normal limits  CBC WITH DIFFERENTIAL/PLATELET - Abnormal; Notable for the following components:   WBC 11.3 (*)    Neutro Abs 8.9 (*)    All other components within normal limits  URINALYSIS, ROUTINE W REFLEX MICROSCOPIC  SEDIMENTATION RATE  C-REACTIVE PROTEIN       Imaging Studies ordered: I ordered imaging studies including CTAP and this study is pending   Medicines ordered and prescription drug  management: Meds ordered this encounter  Medications   ketorolac (TORADOL) 15 MG/ML injection 15 mg   morphine (PF) 4 MG/ML injection 4 mg   ondansetron (ZOFRAN) injection 4 mg    -I have reviewed the patients home medicines and have made adjustments as needed  Critical interventions none    Cardiac Monitoring: The patient was maintained on a cardiac monitor.  I personally viewed and interpreted the cardiac monitored which showed an underlying rhythm of: NSR  Social Determinants of Health:  Factors impacting patients care include: none   Reevaluation: After the interventions noted above, I reevaluated the patient and found that they have :improved  Co morbidities that complicate the patient evaluation  Past Medical History:  Diagnosis Date   Abdominal pain 10/18/2010   Constipation 10/18/2010   Crohn's disease 10/18/2010   SMALL BOWELS   Diarrhea 10/18/2010   Diverticulosis    Kidney stone on left side       Dispostion: I considered admission for this patient, and disposition pending completion of imaging studies.  Please see provider signout for continuation of workup.     Final Clinical Impression(s) / ED Diagnoses Final diagnoses:  None     @PCDICTATION @    Teressa Lower, MD 10/31/22 1158

## 2022-10-30 NOTE — ED Notes (Signed)
Pt. States she is unable to give urine at this time. She states she is feeling very dehydrated.

## 2022-10-31 ENCOUNTER — Encounter (INDEPENDENT_AMBULATORY_CARE_PROVIDER_SITE_OTHER): Payer: Medicare Other | Admitting: Gastroenterology

## 2022-10-31 ENCOUNTER — Inpatient Hospital Stay (HOSPITAL_COMMUNITY): Payer: Medicare Other | Admitting: Anesthesiology

## 2022-10-31 ENCOUNTER — Encounter (HOSPITAL_COMMUNITY): Payer: Self-pay | Admitting: Family Medicine

## 2022-10-31 ENCOUNTER — Encounter (HOSPITAL_COMMUNITY)
Admission: EM | Disposition: A | Discharge status: Home / Self Care | Payer: Self-pay | Source: Home / Self Care | Attending: Family Medicine

## 2022-10-31 ENCOUNTER — Inpatient Hospital Stay (HOSPITAL_COMMUNITY): Payer: Medicare Other

## 2022-10-31 DIAGNOSIS — E876 Hypokalemia: Secondary | ICD-10-CM | POA: Insufficient documentation

## 2022-10-31 DIAGNOSIS — Z8249 Family history of ischemic heart disease and other diseases of the circulatory system: Secondary | ICD-10-CM | POA: Diagnosis not present

## 2022-10-31 DIAGNOSIS — N132 Hydronephrosis with renal and ureteral calculous obstruction: Secondary | ICD-10-CM | POA: Diagnosis present

## 2022-10-31 DIAGNOSIS — K219 Gastro-esophageal reflux disease without esophagitis: Secondary | ICD-10-CM | POA: Diagnosis not present

## 2022-10-31 DIAGNOSIS — D72828 Other elevated white blood cell count: Secondary | ICD-10-CM | POA: Diagnosis not present

## 2022-10-31 DIAGNOSIS — Z9049 Acquired absence of other specified parts of digestive tract: Secondary | ICD-10-CM | POA: Diagnosis not present

## 2022-10-31 DIAGNOSIS — Z880 Allergy status to penicillin: Secondary | ICD-10-CM | POA: Diagnosis not present

## 2022-10-31 DIAGNOSIS — N179 Acute kidney failure, unspecified: Secondary | ICD-10-CM | POA: Insufficient documentation

## 2022-10-31 DIAGNOSIS — N200 Calculus of kidney: Secondary | ICD-10-CM | POA: Diagnosis present

## 2022-10-31 HISTORY — PX: CYSTOSCOPY W/ URETERAL STENT PLACEMENT: SHX1429

## 2022-10-31 LAB — CBC WITH DIFFERENTIAL/PLATELET
Abs Immature Granulocytes: 0.05 10*3/uL (ref 0.00–0.07)
Basophils Absolute: 0 10*3/uL (ref 0.0–0.1)
Basophils Relative: 0 %
Eosinophils Absolute: 0 10*3/uL (ref 0.0–0.5)
Eosinophils Relative: 0 %
HCT: 35.8 % — ABNORMAL LOW (ref 36.0–46.0)
Hemoglobin: 12.3 g/dL (ref 12.0–15.0)
Immature Granulocytes: 1 %
Lymphocytes Relative: 13 %
Lymphs Abs: 1.2 10*3/uL (ref 0.7–4.0)
MCH: 30.2 pg (ref 26.0–34.0)
MCHC: 34.4 g/dL (ref 30.0–36.0)
MCV: 88 fL (ref 80.0–100.0)
Monocytes Absolute: 0.6 10*3/uL (ref 0.1–1.0)
Monocytes Relative: 7 %
Neutro Abs: 7.4 10*3/uL (ref 1.7–7.7)
Neutrophils Relative %: 79 %
Platelets: 255 10*3/uL (ref 150–400)
RBC: 4.07 MIL/uL (ref 3.87–5.11)
RDW: 13.2 % (ref 11.5–15.5)
WBC: 9.3 10*3/uL (ref 4.0–10.5)
nRBC: 0 % (ref 0.0–0.2)

## 2022-10-31 LAB — URINALYSIS, ROUTINE W REFLEX MICROSCOPIC
Bacteria, UA: NONE SEEN
Bilirubin Urine: NEGATIVE
Glucose, UA: NEGATIVE mg/dL
Ketones, ur: 20 mg/dL — AB
Leukocytes,Ua: NEGATIVE
Nitrite: NEGATIVE
Protein, ur: NEGATIVE mg/dL
Specific Gravity, Urine: 1.03 (ref 1.005–1.030)
pH: 6 (ref 5.0–8.0)

## 2022-10-31 LAB — COMPREHENSIVE METABOLIC PANEL
ALT: 25 U/L (ref 0–44)
AST: 33 U/L (ref 15–41)
Albumin: 3.3 g/dL — ABNORMAL LOW (ref 3.5–5.0)
Alkaline Phosphatase: 81 U/L (ref 38–126)
Anion gap: 9 (ref 5–15)
BUN: 11 mg/dL (ref 8–23)
CO2: 22 mmol/L (ref 22–32)
Calcium: 8.3 mg/dL — ABNORMAL LOW (ref 8.9–10.3)
Chloride: 107 mmol/L (ref 98–111)
Creatinine, Ser: 1.11 mg/dL — ABNORMAL HIGH (ref 0.44–1.00)
GFR, Estimated: 52 mL/min — ABNORMAL LOW (ref >60)
Glucose, Bld: 126 mg/dL — ABNORMAL HIGH (ref 70–99)
Potassium: 3.2 mmol/L — ABNORMAL LOW (ref 3.5–5.1)
Sodium: 138 mmol/L (ref 135–145)
Total Bilirubin: 0.8 mg/dL (ref 0.3–1.2)
Total Protein: 6.4 g/dL — ABNORMAL LOW (ref 6.5–8.1)

## 2022-10-31 LAB — C-REACTIVE PROTEIN: CRP: 0.7 mg/dL (ref <1.0)

## 2022-10-31 LAB — MAGNESIUM: Magnesium: 1.7 mg/dL (ref 1.7–2.4)

## 2022-10-31 SURGERY — CYSTOSCOPY, WITH RETROGRADE PYELOGRAM AND URETERAL STENT INSERTION
Anesthesia: General | Laterality: Left

## 2022-10-31 MED ORDER — POTASSIUM CHLORIDE 10 MEQ/100ML IV SOLN
10.0000 meq | INTRAVENOUS | Status: AC
  Administered 2022-10-31 (×3): 10 meq via INTRAVENOUS
  Filled 2022-10-31 (×2): qty 100

## 2022-10-31 MED ORDER — SODIUM CHLORIDE 0.9 % IV SOLN: INTRAVENOUS | Status: DC

## 2022-10-31 MED ORDER — LIDOCAINE HCL (PF) 2 % IJ SOLN
INTRAMUSCULAR | Status: AC
  Filled 2022-10-31: qty 5

## 2022-10-31 MED ORDER — ONDANSETRON HCL 4 MG/2ML IJ SOLN
4.0000 mg | Freq: Once | INTRAMUSCULAR | Status: AC
  Administered 2022-10-31: 4 mg via INTRAVENOUS
  Filled 2022-10-31: qty 2

## 2022-10-31 MED ORDER — DOCUSATE SODIUM 100 MG PO CAPS: 200.0000 mg | ORAL_CAPSULE | Freq: Every day | ORAL | Status: DC | PRN

## 2022-10-31 MED ORDER — DEXAMETHASONE SODIUM PHOSPHATE 10 MG/ML IJ SOLN
INTRAMUSCULAR | Status: AC
  Filled 2022-10-31: qty 1

## 2022-10-31 MED ORDER — OXYCODONE HCL 5 MG PO TABS
5.0000 mg | ORAL_TABLET | ORAL | Status: DC | PRN
  Administered 2022-10-31 – 2022-11-01 (×3): 5 mg via ORAL
  Filled 2022-10-31 (×4): qty 1

## 2022-10-31 MED ORDER — ONDANSETRON HCL 4 MG PO TABS
4.0000 mg | ORAL_TABLET | Freq: Four times a day (QID) | ORAL | Status: DC | PRN
  Administered 2022-10-31: 4 mg via ORAL
  Filled 2022-10-31: qty 1

## 2022-10-31 MED ORDER — ONDANSETRON HCL 4 MG/2ML IJ SOLN
INTRAMUSCULAR | Status: DC | PRN
  Administered 2022-10-31: 4 mg via INTRAVENOUS

## 2022-10-31 MED ORDER — CIPROFLOXACIN IN D5W 400 MG/200ML IV SOLN
400.0000 mg | Freq: Once | INTRAVENOUS | Status: AC
  Administered 2022-10-31: 400 mg via INTRAVENOUS
  Filled 2022-10-31: qty 200

## 2022-10-31 MED ORDER — PROPOFOL 10 MG/ML IV BOLUS
INTRAVENOUS | Status: AC
  Filled 2022-10-31: qty 20

## 2022-10-31 MED ORDER — CHLORHEXIDINE GLUCONATE 0.12 % MT SOLN
15.0000 mL | Freq: Once | OROMUCOSAL | Status: AC
  Administered 2022-10-31: 15 mL via OROMUCOSAL

## 2022-10-31 MED ORDER — SODIUM CHLORIDE 0.9 % IV SOLN
12.5000 mg | Freq: Once | INTRAVENOUS | Status: AC
  Administered 2022-10-31: 12.5 mg via INTRAVENOUS
  Filled 2022-10-31: qty 0.5

## 2022-10-31 MED ORDER — HYDROMORPHONE HCL 1 MG/ML IJ SOLN
1.0000 mg | Freq: Once | INTRAMUSCULAR | Status: AC
  Administered 2022-10-31: 1 mg via INTRAVENOUS
  Filled 2022-10-31: qty 1

## 2022-10-31 MED ORDER — LIDOCAINE 2% (20 MG/ML) 5 ML SYRINGE
INTRAMUSCULAR | Status: DC | PRN
  Administered 2022-10-31: 70 mg via INTRAVENOUS

## 2022-10-31 MED ORDER — ACETAMINOPHEN 325 MG PO TABS
650.0000 mg | ORAL_TABLET | Freq: Four times a day (QID) | ORAL | Status: DC | PRN
  Administered 2022-10-31: 650 mg via ORAL
  Filled 2022-10-31: qty 2

## 2022-10-31 MED ORDER — DEXAMETHASONE SODIUM PHOSPHATE 10 MG/ML IJ SOLN
INTRAMUSCULAR | Status: DC | PRN
  Administered 2022-10-31: 10 mg via INTRAVENOUS

## 2022-10-31 MED ORDER — FAMOTIDINE 20 MG PO TABS
20.0000 mg | ORAL_TABLET | Freq: Every day | ORAL | Status: DC
  Administered 2022-10-31: 20 mg via ORAL
  Filled 2022-10-31: qty 1

## 2022-10-31 MED ORDER — ONDANSETRON HCL 4 MG/2ML IJ SOLN: 4.0000 mg | Freq: Four times a day (QID) | INTRAMUSCULAR | Status: DC | PRN

## 2022-10-31 MED ORDER — FENTANYL CITRATE (PF) 100 MCG/2ML IJ SOLN
INTRAMUSCULAR | Status: DC | PRN
  Administered 2022-10-31: 25 ug via INTRAVENOUS

## 2022-10-31 MED ORDER — IOHEXOL 300 MG/ML  SOLN
INTRAMUSCULAR | Status: DC | PRN
  Administered 2022-10-31: 8 mL

## 2022-10-31 MED ORDER — SODIUM CHLORIDE 0.9 % IV BOLUS
500.0000 mL | Freq: Once | INTRAVENOUS | Status: AC
  Administered 2022-10-31: 500 mL via INTRAVENOUS

## 2022-10-31 MED ORDER — FENTANYL CITRATE (PF) 100 MCG/2ML IJ SOLN
INTRAMUSCULAR | Status: AC
  Filled 2022-10-31: qty 2

## 2022-10-31 MED ORDER — MORPHINE SULFATE (PF) 2 MG/ML IV SOLN: 2.0000 mg | INTRAVENOUS | Status: DC | PRN

## 2022-10-31 MED ORDER — PROPOFOL 10 MG/ML IV BOLUS
INTRAVENOUS | Status: DC | PRN
  Administered 2022-10-31: 150 ug/kg/min via INTRAVENOUS
  Administered 2022-10-31: 150 mg via INTRAVENOUS

## 2022-10-31 MED ORDER — POTASSIUM CHLORIDE 10 MEQ/100ML IV SOLN
10.0000 meq | INTRAVENOUS | Status: AC
  Administered 2022-10-31 (×2): 10 meq via INTRAVENOUS
  Filled 2022-10-31 (×2): qty 100

## 2022-10-31 MED ORDER — ONDANSETRON HCL 4 MG/2ML IJ SOLN
INTRAMUSCULAR | Status: AC
  Filled 2022-10-31: qty 2

## 2022-10-31 MED ORDER — PROMETHAZINE HCL 25 MG/ML IJ SOLN
INTRAMUSCULAR | Status: AC
  Filled 2022-10-31: qty 1

## 2022-10-31 MED ORDER — HEPARIN SODIUM (PORCINE) 5000 UNIT/ML IJ SOLN
5000.0000 [IU] | Freq: Three times a day (TID) | INTRAMUSCULAR | Status: DC
  Administered 2022-10-31 – 2022-11-01 (×3): 5000 [IU] via SUBCUTANEOUS
  Filled 2022-10-31 (×3): qty 1

## 2022-10-31 MED ORDER — LACTATED RINGERS IV SOLN: INTRAVENOUS | Status: DC

## 2022-10-31 MED ORDER — ACETAMINOPHEN 650 MG RE SUPP: 650.0000 mg | Freq: Four times a day (QID) | RECTAL | Status: DC | PRN

## 2022-10-31 SURGICAL SUPPLY — 18 items
BAG URO CATCHER STRL LF (MISCELLANEOUS) ×2 IMPLANT
BASKET ZERO TIP NITINOL 2.4FR (BASKET) IMPLANT
BSKT STON RTRVL ZERO TP 2.4FR (BASKET)
CATH URETL OPEN END 6FR 70 (CATHETERS) IMPLANT
CLOTH BEACON ORANGE TIMEOUT ST (SAFETY) ×2 IMPLANT
GLOVE SURG LX STRL 7.5 STRW (GLOVE) ×2 IMPLANT
GOWN STRL REUS W/ TWL XL LVL3 (GOWN DISPOSABLE) ×2 IMPLANT
GOWN STRL REUS W/TWL XL LVL3 (GOWN DISPOSABLE) ×1
GUIDEWIRE ANG ZIPWIRE 038X150 (WIRE) ×2 IMPLANT
GUIDEWIRE STR DUAL SENSOR (WIRE) IMPLANT
KIT TURNOVER KIT A (KITS) IMPLANT
MANIFOLD NEPTUNE II (INSTRUMENTS) ×2 IMPLANT
PACK CYSTO (CUSTOM PROCEDURE TRAY) ×2 IMPLANT
STENT POLARIS 5FRX24 (STENTS) IMPLANT
STENT POLARIS 5FRX26 (STENTS) IMPLANT
TUBE PU 8FR 16IN ENFIT (TUBING) IMPLANT
TUBING CONNECTING 10 (TUBING) ×2 IMPLANT
TUBING UROLOGY SET (TUBING) IMPLANT

## 2022-10-31 NOTE — H&P (Signed)
History and Physical    Patient: Gail Henry DOB: 1946-01-12 DOA: 10/30/2022 DOS: the patient was seen and examined on 10/31/2022 PCP: Caryl Bis, MD  Patient coming from: Home  Chief Complaint:  Chief Complaint  Patient presents with   Flank Pain   HPI: Gail Henry is a 77 y.o. female with medical history significant of abdominal pain, Crohn's disease, diverticulosis, and more presents the ED with a chief complaint of flank pain.  Patient reports she had left leg pain that started on Thursday.  She thought it was diverticulitis and she called her PCP.  He told her to go on a liquid diet for 3 days.  She reports she did that the pain got better.  She started eating on the first, and the pain seemed to be okay, so she continued to eat and then when she was sitting down at her computer her pain got acutely worse.  She describes it as sharp pain and burning pain.  It is in her left flank.  It was associated with bloating throughout her abdomen.  She reports she had to take off her bra on lower her waistline because she felt so bloated.  She had nausea but no vomiting.  Patient denies dysuria, hematuria.  She denies urinary frequency or urgency.  She denies constipation.  She reports she had loose stools but she had been on a liquid diet for 3 days so she expected that.  She denies any fever.  She has never had a kidney stone before.  Up until that she was feeling in her normal state of health.  Patient has no other complaints at this time.  Patient does not smoke, does not drink, does not use illicit drugs.  She is vaccinated for flu but not COVID.  Patient is a full code. Review of Systems: As mentioned in the history of present illness. All other systems reviewed and are negative. Past Medical History:  Diagnosis Date   Abdominal pain 10/18/2010   Constipation 10/18/2010   Crohn's disease 10/18/2010   SMALL BOWELS   Diarrhea 10/18/2010   Diverticulosis    Kidney  stone on left side    Past Surgical History:  Procedure Laterality Date   BIOPSY  06/02/2020   Procedure: BIOPSY;  Surgeon: Rogene Houston, MD;  [facility];  Service: Endoscopy;;   COLONOSCOPY  10/16/2001   COLONOSCOPY  03/15/2012   Procedure: COLONOSCOPY;  Surgeon: Rogene Houston, MD;  [facility];  Service: Endoscopy;  Laterality: N/A;  730   COLONOSCOPY WITH PROPOFOL N/A 06/02/2020   Procedure: COLONOSCOPY WITH PROPOFOL;  Surgeon: Rogene Houston, MD;  [facility];  Service: Endoscopy;  Laterality: N/A;  730   COLONOSCOPY WITH PROPOFOL N/A 07/21/2022   Procedure: COLONOSCOPY WITH PROPOFOL;  Surgeon: Harvel Quale, MD;  [facility];  Service: Gastroenterology;  Laterality: N/A;  9:45am, asa 1-2   ESOPHAGEAL DILATION N/A 06/02/2020   Procedure: ESOPHAGEAL DILATION;  Surgeon: Rogene Houston, MD;  [facility];  Service: Endoscopy;  Laterality: N/A;   ESOPHAGOGASTRODUODENOSCOPY (EGD) WITH PROPOFOL N/A 06/02/2020   Procedure: ESOPHAGOGASTRODUODENOSCOPY (EGD) WITH PROPOFOL;  Surgeon: Rogene Houston, MD;  [facility];  Service: Endoscopy;  Laterality: N/A;   HEMOSTASIS CLIP PLACEMENT  07/21/2022   Procedure: HEMOSTASIS CLIP PLACEMENT;  Surgeon: Harvel Quale, MD;  [facility];  Service: Gastroenterology;;   LAPAROSCOPIC CHOLECYSTECTOMY  12/17/96  DEMASON   POLYPECTOMY  07/21/2022   Procedure: POLYPECTOMY INTESTINAL;  Surgeon: Harvel Quale, MD;  [facility];  Service: Gastroenterology;;   Social History:  reports that she has never smoked. She has never been exposed to tobacco smoke. She has never used smokeless tobacco. She reports that she does not drink alcohol and does not use drugs.  Allergies  Allergen Reactions   Penicillins Hives   Dexilant [Dexlansoprazole] Other (See Comments)    Muscle pain   Nexium [Esomeprazole Magnesium] Nausea Only     Family History  Problem Relation Age of Onset   Heart disease Father    Healthy Sister    Healthy Brother    Healthy Sister    Healthy Daughter    Healthy Son     Prior to Admission medications   Medication Sig Start Date End Date Taking? Authorizing Provider  acetaminophen (TYLENOL) 500 MG tablet Take 500 mg by mouth every 6 (six) hours as needed for moderate pain or headache.    [provider]  B Complex-C (B-COMPLEX WITH VITAMIN C) tablet Take 1 tablet by mouth daily. 11/24/21   Rehman, Mechele Dawley, MD  Carboxymethylcellul-Glycerin (REFRESH RELIEVA OP) Place 1-2 drops into both eyes 3 (three) times daily as needed (dry/irritated eyes.).    [provider]  Cholecalciferol (VITAMIN D3) 2000 UNITS TABS Take 2,000 Units by mouth in the morning.    [provider]  docusate sodium (COLACE) 100 MG capsule Take 2 capsules (200 mg total) by mouth at bedtime. Patient taking differently: Take 200 mg by mouth daily as needed (constipation.). 11/24/21   Rogene Houston, MD  Multiple Vitamins-Minerals (MULTIVITAMIN WITH MINERALS) tablet Take 2 tablets by mouth in the morning. forbia    [provider]  nystatin-triamcinolone (MYCOLOG II) cream Apply 1 Application topically 2 (two) times daily as needed (skin irritation (skin folds)).    [provider]  RABEprazole (ACIPHEX) 20 MG tablet Take 1 tablet (20 mg total) by mouth daily before breakfast. 11/24/21   Rehman, Mechele Dawley, MD  sodium chloride (OCEAN) 0.65 % SOLN nasal spray Place 1 spray into both nostrils in the morning and at bedtime.    [provider]  vedolizumab (ENTYVIO) 300 MG injection Inject 300 mg into the vein every 8 (eight) weeks.    [provider]    Physical Exam: Vitals:   10/31/22 0115 10/31/22 0230 10/31/22 0400 10/31/22 0530  BP:  130/76 133/71 (!) 154/82  Pulse: 72 71 70 84  Resp: 18 12 17 19   Temp:   99.3 F (37.4 C)   TempSrc:   Oral   SpO2: 97% 95%  94% 96%   1.  General: Patient lying supine in bed,  no acute distress   2. Psychiatric: Alert and oriented x 3, mood and behavior normal for situation, pleasant and cooperative with exam   3. Neurologic: Speech and language are normal, face is symmetric, moves all 4 extremities voluntarily, at baseline without acute deficits on limited exam   4. HEENMT:  Head is atraumatic, normocephalic, pupils reactive to light, neck is supple, trachea is midline, mucous membranes are moist   5. Respiratory : Lungs are clear to auscultation bilaterally without wheezing, rhonchi, rales, no cyanosis, no increase in work of breathing or accessory muscle use   6. Cardiovascular : Heart rate normal, rhythm is regular, no murmurs, rubs or gallops, no peripheral edema, peripheral pulses palpated   7. Gastrointestinal:  Abdomen is soft, nondistended,  nontender to palpation bowel sounds active, no masses or organomegaly palpated   8. Skin:  Skin is warm, dry and intact without rashes, acute lesions, or ulcers on limited exam   9.Musculoskeletal:  No acute deformities or trauma, no asymmetry in tone, no peripheral edema, peripheral pulses palpated, no tenderness to palpation in the extremities   Data Reviewed: In the ED Temp 98-90 9.1, heart rate 69-80, respiratory rate 11-20, blood pressure 128/71-157/76, satting 94-98% Slight leukocytosis 11.3, hemoglobin 13.4 Chemistry reveals a hypokalemia at 3.4 Sed rate is 28 UA is not negative UTI CT shows 1 cm stone Patient was given Toradol, Dilaudid, morphine, Zofran x 2, and 500 mL bolus in the ED Urology was not consulted from ER but has been notified of consult at admission Admission requested for renal calculus with intractable pain  Assessment and Plan: * Hydronephrosis concurrent with and due to calculi of kidney and ureter - CT scan showed a 1 cm stone in proximal left ureter with mild hydronephrosis - Consult urology - N.p.o. for possible  urologic procedure - Pain control with pain scale - Associated nausea, treat with Zofran and Phenergan - Continue to monitor  Hypokalemia - Potassium 3.4 - Replace and recheck  GERD (gastroesophageal reflux disease) - Continue Pepcid      Advance Care Planning:   Code Status: Full Code  Consults:  Urology Family Communication: Granddaughter at bedside  Severity of Illness: The appropriate patient status for this patient is OBSERVATION. Observation status is judged to be reasonable and necessary in order to provide the required intensity of service to ensure the patient's safety. The patient's presenting symptoms, physical exam findings, and initial radiographic and laboratory data in the context of their medical condition is felt to place them at decreased risk for further clinical deterioration. Furthermore, it is anticipated that the patient will be medically stable for discharge from the hospital within 2 midnights of admission.   Author: Rolla Plate, DO 10/31/2022 6:16 AM  For on call review [email or web address].

## 2022-10-31 NOTE — Consult Note (Signed)
Reason for Consult:Left Ureteral / Renal Stones  Referring Physician: Darliss Cheney MD  Gail Henry is an 77 y.o. female.   HPI:   1 - Left Ureteral / Renal Stones - 1cm left prox ureteral stone with moderate hydro by ER CT at [facility] 10/30/22. Cr 1.1, WBC 9, UA without infectious parameters but colic difficult to control.   PMH sig for Chron's s/p surgeyr x 2. Her PCP is Kern Alberta MD in [place].   Today "Gail Henry" is seen for urgent consult for above. She in NPO. She was unfortunately admitted to [facility] overnight but we have no Urol coverage there today due to Spring Break week.   Past Medical History:  Diagnosis Date   Abdominal pain 10/18/2010   Constipation 10/18/2010   Crohn's disease 10/18/2010   SMALL BOWELS   Diarrhea 10/18/2010   Diverticulosis    Kidney stone on left side     Past Surgical History:  Procedure Laterality Date   BIOPSY  06/02/2020   Procedure: BIOPSY;  Surgeon: Rogene Houston, MD;  [facility];  Service: Endoscopy;;   COLONOSCOPY  10/16/2001   COLONOSCOPY  03/15/2012   Procedure: COLONOSCOPY;  Surgeon: Rogene Houston, MD;  [facility];  Service: Endoscopy;  Laterality: N/A;  730   COLONOSCOPY WITH PROPOFOL N/A 06/02/2020   Procedure: COLONOSCOPY WITH PROPOFOL;  Surgeon: Rogene Houston, MD;  [facility];  Service: Endoscopy;  Laterality: N/A;  730   COLONOSCOPY WITH PROPOFOL N/A 07/21/2022   Procedure: COLONOSCOPY WITH PROPOFOL;  Surgeon: Harvel Quale, MD;  [facility];  Service: Gastroenterology;  Laterality: N/A;  9:45am, asa 1-2   ESOPHAGEAL DILATION N/A 06/02/2020   Procedure: ESOPHAGEAL DILATION;  Surgeon: Rogene Houston, MD;  [facility];  Service: Endoscopy;  Laterality: N/A;   ESOPHAGOGASTRODUODENOSCOPY (EGD) WITH PROPOFOL N/A 06/02/2020   Procedure: ESOPHAGOGASTRODUODENOSCOPY (EGD) WITH PROPOFOL;  Surgeon: Rogene Houston, MD;  [facility];   Service: Endoscopy;  Laterality: N/A;   HEMOSTASIS CLIP PLACEMENT  07/21/2022   Procedure: HEMOSTASIS CLIP PLACEMENT;  Surgeon: Harvel Quale, MD;  [facility];  Service: Gastroenterology;;   LAPAROSCOPIC CHOLECYSTECTOMY  12/17/96   DEMASON   POLYPECTOMY  07/21/2022   Procedure: POLYPECTOMY INTESTINAL;  Surgeon: Harvel Quale, MD;  [facility];  Service: Gastroenterology;;    Family History  Problem Relation Age of Onset   Heart disease Father    Healthy Sister    Healthy Brother    Healthy Sister    Healthy Daughter    Healthy Son     Social History:  reports that she has never smoked. She has never been exposed to tobacco smoke. She has never used smokeless tobacco. She reports that she does not drink alcohol and does not use drugs.  Allergies:  Allergies  Allergen Reactions   Penicillins Hives   Dexilant [Dexlansoprazole] Other (See Comments)    Muscle pain   Nexium [Esomeprazole Magnesium] Nausea Only    Medications: I have reviewed the patient's current medications.  Results for orders placed or performed during the hospital encounter of 10/30/22 (from the past 48 hour(s))  Urinalysis, Routine w reflex microscopic -Urine, Clean Catch     Status: Abnormal   Collection Time: 10/30/22  1:45 AM  Result Value Ref Range   Color, Urine YELLOW YELLOW   APPearance CLEAR CLEAR   Specific Gravity, Urine 1.030 1.005 - 1.030  pH 6.0 5.0 - 8.0   Glucose, UA NEGATIVE NEGATIVE mg/dL   Hgb urine dipstick SMALL (A) NEGATIVE   Bilirubin Urine NEGATIVE NEGATIVE   Ketones, ur 20 (A) NEGATIVE mg/dL   Protein, ur NEGATIVE NEGATIVE mg/dL   Nitrite NEGATIVE NEGATIVE   Leukocytes,Ua NEGATIVE NEGATIVE   RBC / HPF 0-5 0 - 5 RBC/hpf   WBC, UA 0-5 0 - 5 WBC/hpf   Bacteria, UA NONE SEEN NONE SEEN   Squamous Epithelial / HPF 0-5 0 - 5 /HPF   Mucus PRESENT     Comment: Performed at [facility], [address], [place], [place] [address]   Comprehensive metabolic panel     Status: Abnormal   Collection Time: 10/30/22  8:00 PM  Result Value Ref Range   Sodium 137 135 - 145 mmol/L   Potassium 3.4 (L) 3.5 - 5.1 mmol/L   Chloride 104 98 - 111 mmol/L   CO2 24 22 - 32 mmol/L   Glucose, Bld 127 (H) 70 - 99 mg/dL    Comment: Glucose reference range applies only to samples taken after fasting for at least 8 hours.   BUN 12 8 - 23 mg/dL   Creatinine, Ser 0.88 0.44 - 1.00 mg/dL   Calcium 8.8 (L) 8.9 - 10.3 mg/dL   Total Protein 7.2 6.5 - 8.1 g/dL   Albumin 3.8 3.5 - 5.0 g/dL   AST 44 (H) 15 - 41 U/L   ALT 30 0 - 44 U/L   Alkaline Phosphatase 87 38 - 126 U/L   Total Bilirubin 0.6 0.3 - 1.2 mg/dL   GFR, Estimated >60 >60 mL/min    Comment: (NOTE) Calculated using the CKD-EPI Creatinine Equation (2021)    Anion gap 9 5 - 15    Comment: Performed at [facility], [address] [place], [place] [address]  CBC with Differential     Status: Abnormal   Collection Time: 10/30/22  8:00 PM  Result Value Ref Range   WBC 11.3 (H) 4.0 - 10.5 K/uL   RBC 4.48 3.87 - 5.11 MIL/uL   Hemoglobin 13.4 12.0 - 15.0 g/dL   HCT 39.9 36.0 - 46.0 %   MCV 89.1 80.0 - 100.0 fL   MCH 29.9 26.0 - 34.0 pg   MCHC 33.6 30.0 - 36.0 g/dL   RDW 13.1 11.5 - 15.5 %   Platelets 301 150 - 400 K/uL   nRBC 0.0 0.0 - 0.2 %   Neutrophils Relative % 79 %   Neutro Abs 8.9 (H) 1.7 - 7.7 K/uL   Lymphocytes Relative 14 %   Lymphs Abs 1.6 0.7 - 4.0 K/uL   Monocytes Relative 6 %   Monocytes Absolute 0.7 0.1 - 1.0 K/uL   Eosinophils Relative 0 %   Eosinophils Absolute 0.1 0.0 - 0.5 K/uL   Basophils Relative 0 %   Basophils Absolute 0.0 0.0 - 0.1 K/uL   Immature Granulocytes 1 %   Abs Immature Granulocytes 0.06 0.00 - 0.07 K/uL    Comment: Performed at [facility], [address] [place], [place] [address]  Sedimentation rate     Status: Abnormal   Collection Time: 10/30/22 10:40 PM  Result Value Ref Range   Sed Rate 28 (H) 0 - 22 mm/hr    Comment: Performed  at [facility], [address] [place], [place] [address]  C-reactive protein     Status: None   Collection Time: 10/30/22 10:40 PM  Result Value Ref Range   CRP 0.7 <  1.0 mg/dL    Comment: Performed at [facility], [place] [address], [place], [place] [address]  Comprehensive metabolic panel     Status: Abnormal   Collection Time: 10/31/22  4:09 AM  Result Value Ref Range   Sodium 138 135 - 145 mmol/L   Potassium 3.2 (L) 3.5 - 5.1 mmol/L   Chloride 107 98 - 111 mmol/L   CO2 22 22 - 32 mmol/L   Glucose, Bld 126 (H) 70 - 99 mg/dL    Comment: Glucose reference range applies only to samples taken after fasting for at least 8 hours.   BUN 11 8 - 23 mg/dL   Creatinine, Ser 1.11 (H) 0.44 - 1.00 mg/dL   Calcium 8.3 (L) 8.9 - 10.3 mg/dL   Total Protein 6.4 (L) 6.5 - 8.1 g/dL   Albumin 3.3 (L) 3.5 - 5.0 g/dL   AST 33 15 - 41 U/L   ALT 25 0 - 44 U/L   Alkaline Phosphatase 81 38 - 126 U/L   Total Bilirubin 0.8 0.3 - 1.2 mg/dL   GFR, Estimated 52 (L) >60 mL/min    Comment: (NOTE) Calculated using the CKD-EPI Creatinine Equation (2021)    Anion gap 9 5 - 15    Comment: Performed at [facility], [address], [place], [place] [address]  Magnesium     Status: None   Collection Time: 10/31/22  4:09 AM  Result Value Ref Range   Magnesium 1.7 1.7 - 2.4 mg/dL    Comment: Performed at [facility], [address], [place], [place] [address]  CBC with Differential/Platelet     Status: Abnormal   Collection Time: 10/31/22  4:09 AM  Result Value Ref Range   WBC 9.3 4.0 - 10.5 K/uL   RBC 4.07 3.87 - 5.11 MIL/uL   Hemoglobin 12.3 12.0 - 15.0 g/dL   HCT 35.8 (L) 36.0 - 46.0 %   MCV 88.0 80.0 - 100.0 fL   MCH 30.2 26.0 - 34.0 pg   MCHC 34.4 30.0 - 36.0 g/dL   RDW 13.2 11.5 - 15.5 %   Platelets 255 150 - 400 K/uL   nRBC 0.0 0.0 - 0.2 %   Neutrophils Relative % 79 %   Neutro Abs 7.4 1.7 - 7.7 K/uL   Lymphocytes Relative 13 %   Lymphs Abs 1.2 0.7 - 4.0 K/uL   Monocytes Relative 7 %    Monocytes Absolute 0.6 0.1 - 1.0 K/uL   Eosinophils Relative 0 %   Eosinophils Absolute 0.0 0.0 - 0.5 K/uL   Basophils Relative 0 %   Basophils Absolute 0.0 0.0 - 0.1 K/uL   Immature Granulocytes 1 %   Abs Immature Granulocytes 0.05 0.00 - 0.07 K/uL    Comment: Performed at [facility], [address], [place],  [address]    CT ABDOMEN PELVIS W CONTRAST  Result Date: 10/31/2022 CLINICAL DATA:  Left lower quadrant abdominal pain. EXAM: CT ABDOMEN AND PELVIS WITH CONTRAST TECHNIQUE: Multidetector CT imaging of the abdomen and pelvis was performed using the standard protocol following bolus administration of intravenous contrast. RADIATION DOSE REDUCTION: This exam was performed according to the departmental dose-optimization program which includes automated exposure control, adjustment of the mA and/or kV according to patient size and/or use of iterative reconstruction technique. CONTRAST:  111mL OMNIPAQUE IOHEXOL 300 MG/ML  SOLN COMPARISON:  CT abdomen pelvis dated 11/18/2015. FINDINGS: Lower chest: The visualized lung bases are clear. No intra-abdominal free air or free fluid. Hepatobiliary: Several  liver cysts and additional smaller hypodense lesions which are too small to characterize. No within the tension. Cholecystectomy. Pancreas: Unremarkable. No pancreatic ductal dilatation or surrounding inflammatory changes. Spleen: Normal in size without focal abnormality. Adrenals/Urinary Tract: The adrenal glands are unremarkable. There is a 1 cm stone in the proximal left ureter with mild left hydronephrosis. Additional 4 mm nonobstructing left renal inferior pole calculus. There is mild left perinephric edema and stranding. Correlation with urinalysis recommended to exclude superimposed UTI. There is delayed excretion of contrast by the left kidney. The right kidney, right ureter, and urinary bladder appear unremarkable. Stomach/Bowel: There is sigmoid diverticulosis without active inflammatory  changes. There is no bowel obstruction. Appendectomy. Vascular/Lymphatic: Mild aortoiliac atherosclerotic disease. The IVC is unremarkable. No portal venous gas. There is no adenopathy. Reproductive: The uterus is anteverted. Small uterine fibroids. No adnexal masses. Other: Broad-based fat containing ventral supraumbilical hernia. Musculoskeletal: No acute osseous pathology. IMPRESSION: 1. A 1 cm stone in the proximal left ureter with mild left hydronephrosis. Correlation with urinalysis recommended to exclude superimposed UTI. 2. Additional 4 mm nonobstructing left renal inferior pole calculus. 3. Sigmoid diverticulosis. No bowel obstruction. 4.  Aortic Atherosclerosis (ICD10-I70.0). Electronically Signed   By: Anner Crete M.D.   On: 10/31/2022 00:11    Review of Systems  Constitutional:  Positive for fatigue.  Genitourinary:  Positive for flank pain and urgency.  All other systems reviewed and are negative.  Blood pressure 131/76, pulse 72, temperature 97.9 F (36.6 C), resp. rate 17, height 5\' 6"  (1.676 m), weight 77.1 kg, SpO2 98 %. Physical Exam HENT:     Head: Normocephalic.     Nose: Nose normal.  Eyes:     Pupils: Pupils are equal, round, and reactive to light.  Pulmonary:     Effort: Pulmonary effort is normal.  Abdominal:     General: Abdomen is flat.  Genitourinary:    Comments: Mild left CVAT at present Neurological:     General: No focal deficit present.     Mental Status: She is alert.     Assessment/Plan:  I recommended renal deompression with ureteral stent today and then outpatient ureteroscopy in elective setting with goal of stone free given multifocality as optimal. Risks, benefits, alternatives, expected peri-op course discussed.   Alexis Frock 10/31/2022, 9:32 AM

## 2022-10-31 NOTE — Anesthesia Procedure Notes (Signed)
Procedure Name: LMA Insertion Date/Time: 10/31/2022 4:18 PM  Performed by: Cleda Daub, CRNAPre-anesthesia Checklist: Patient identified, Emergency Drugs available, Suction available and Patient being monitored Patient Re-evaluated:Patient Re-evaluated prior to induction Oxygen Delivery Method: Circle system utilized Preoxygenation: Pre-oxygenation with 100% oxygen Induction Type: IV induction LMA: LMA inserted LMA Size: 4.0 Number of attempts: 1 Placement Confirmation: positive ETCO2 and breath sounds checked- equal and bilateral Tube secured with: Tape Dental Injury: Teeth and Oropharynx as per pre-operative assessment

## 2022-10-31 NOTE — ED Provider Notes (Signed)
  Physical Exam  BP 130/76   Pulse 71   Temp 98 F (36.7 C) (Oral)   Resp 12   SpO2 95%   Physical Exam Vitals and nursing note reviewed.  Constitutional:      General: She is not in acute distress.    Appearance: She is well-developed. She is not diaphoretic.  HENT:     Head: Normocephalic and atraumatic.  Pulmonary:     Effort: Pulmonary effort is normal.  Abdominal:     General: Bowel sounds are normal. There is no distension.     Palpations: Abdomen is soft.     Tenderness: There is no abdominal tenderness. There is left CVA tenderness.  Musculoskeletal:        General: Normal range of motion.     Cervical back: Normal range of motion and neck supple.  Skin:    General: Skin is warm and dry.  Neurological:     General: No focal deficit present.     Mental Status: She is alert and oriented to person, place, and time.     Procedures  Procedures  ED Course / MDM    Medical Decision Making Amount and/or Complexity of Data Reviewed Labs: ordered. Radiology: ordered.  Risk Prescription drug management. Decision regarding hospitalization.   Care assumed from Dr. Matilde Sprang at shift change.  Patient presenting here with left flank pain suspicious for renal calculus.  Care was signed out to me awaiting results of CT scan and response to pain medication.  CT scan has returned showing a 10 mm calculus in the proximal ureter with hydronephrosis.  Urinalysis shows no evidence for infection.  Patient has received morphine and Dilaudid for pain along with Zofran for nausea, but continues to complain of significant discomfort and nausea.  I do not feel that her pain will adequately be controlled in the outpatient setting and feel as though admission is appropriate.  I have spoken with the hospitalist who agrees to admit.       Veryl Speak, MD 10/31/22 [phone]

## 2022-10-31 NOTE — Assessment & Plan Note (Signed)
-   Potassium 3.4 -Replace and recheck 

## 2022-10-31 NOTE — Op Note (Signed)
Gail Henry, Gail Henry MEDICAL RECORD NO: [id number] ACCOUNT NO: [id number] DATE OF BIRTH: May 30, 1946 FACILITY: [facility] LOCATION: [facility] PHYSICIAN: Alexis Frock, MD  Operative Report   DATE OF PROCEDURE: 10/31/2022  PREOPERATIVE DIAGNOSIS:  Left ureteral stone with refractory colic.  PROCEDURE PERFORMED:   1.  Cystoscopy with left retrograde pyelogram interpretation. 2.  Insertion of left ureteral stent.  ESTIMATED BLOOD LOSS:  Nil.  COMPLICATIONS:  None.  SPECIMEN:  None.  FINDINGS:   1.  Left hydronephrosis. 2.  Large proximal stone. 3.  Successful placement of left ureteral stent, proximal end in the renal pelvis, distal end in urinary bladder.  INDICATIONS:  The patient is a 77 year old lady with known history of small renal stone times a number of years and has not been problematic.  She was found on workup of colicky flank pain yesterday night at [facility] ER to have progression of her stone  to approximately 10 mm proximal ureteral stone with hydronephrosis.  She did have some malaise, fatigue, concerning for possible early infection.  However, she was not febrile and white count was normal. As there was not urology coverage at [facility]  today.  She was transferred to [facility] where she was evaluated.  Her colic was difficult to control and given her equivocal infectious parameters it was felt the safest means of management would be stenting today to allow for renal decompression,  clearance of any infectious parameters followed by ureteroscopy in elective setting and she presents for stenting today.  Informed consent was obtained and placed in medical record.  PROCEDURE DETAILS:  The patient being identified and verified, procedure being left ureteral stent placement confirmed.  Procedure timeout was performed.  Intravenous antibiotics were administered.  General anesthesia was introduced.  The patient was  placed into a low lithotomy position.  Sterile field was  created, prepped and draped the patient's vagina, introitus, and proximal thighs using iodine.  Cystourethroscopy was performed using 21-French rigid cystoscope with offset lens.  Inspection of  urinary bladder revealed no diverticula, calcifications papillary lesions.  Left ureteral orifice was cannulated with a 6-French end-hole catheter, and a left retrograde pyelogram was obtained.  Left retrograde pyelogram demonstrated single left ureter, single system left kidney.  There was a filling defect in the mid to proximal ureter consistent with known stone with moderate hydronephrosis above this.  A 0.038 ZIPwire was advanced to the  level of the upper pole and a new 5 x 24 Polaris type stent was placed. Despite the patient not being excessively tall the 24-French stent did not appear to be long enough to reliably drain the renal pelvis therefore, it was exchanged via the ZIPwire for  a 26-French type stent, which resulted in much better proximal curl in the renal pelvis well above the area of stone and distal end in urinary bladder.  Bladder was empty per cystoscope.  Procedure was then terminated.  The patient tolerated procedure  well, no immediate complications.  The patient taken to postanesthesia care unit in stable condition.  Plan for continued inpatient admission under hospitalist service.  She will be scheduled for ureteroscopy in elective setting in several weeks.   PUS D: 10/31/2022 4:36:56 pm T: 10/31/2022 5:02:00 pm  JOB: [id number]

## 2022-10-31 NOTE — Assessment & Plan Note (Addendum)
Continue Pepcid  

## 2022-10-31 NOTE — TOC Progression Note (Signed)
  Transition of Care [facility]) Screening Note   Patient Details  Name: Gail Henry Date of Birth: 04/16/1946   Transition of Care [facility]) CM/SW Contact:    Boneta Lucks, RN Phone Number: 10/31/2022, 12:18 PM    Transition of Care Department [facility]) has reviewed patient and no TOC needs have been identified at this time. We will continue to monitor patient advancement through interdisciplinary progression rounds. If new patient transition needs arise, please place a TOC consult.       Barriers to Discharge: Continued Medical Work up  Expected Discharge Plan and Services        Social Determinants of Health (SDOH) Interventions SDOH Screenings   Food Insecurity: No Food Insecurity (04/05/2022)  Housing: Low Risk  (04/05/2022)  Transportation Needs: No Transportation Needs (04/05/2022)  Utilities: Not At Risk (04/05/2022)  Tobacco Use: Low Risk  (10/30/2022)    Readmission Risk Interventions     No data to display

## 2022-10-31 NOTE — Progress Notes (Signed)
This is a very pleasant 77 year old lady with history of Crohn's disease, diverticulosis who presented to the ED at [facility] with flank pain and was eventually diagnosed with left-sided hydronephrosis and 1 cm of left-sided ureteric stone.  Interestingly, UA was not indicative of UTI.  Patient seen and examined early this morning.  Granddaughter at the bedside.  Patient still has left flank pain but it is better since she has received pain medications.  She has very mild left CVA tenderness.  Patient has very mild AKI, her baseline creatinine appears to be around 0.8, creatinine currently is 1.1.  Also has mild hypokalemia.  I will replace potassium.  He she is already on IV fluids.  No indication of antibiotics.  [facility] was admitted at [facility] apparently because it was misunderstood that urology coverage was still available at [facility] however when we discussed with urology this morning, Dr. Tresa Moore told us that there is no coverage for [facility] from urology today and he recommended transferring the patient to [facility].  Orders have been placed, patient placement have been notified.

## 2022-10-31 NOTE — Assessment & Plan Note (Signed)
-   CT scan showed a 1 cm stone in proximal left ureter with mild hydronephrosis - Consult urology - N.p.o. for possible urologic procedure - Pain control with pain scale - Associated nausea, treat with Zofran and Phenergan - Continue to monitor

## 2022-10-31 NOTE — Anesthesia Postprocedure Evaluation (Signed)
Anesthesia Post Note  Patient: Gail Henry  Procedure(s) Performed: CYSTOSCOPY WITH RETROGRADE PYELOGRAM/URETERAL STENT PLACEMENT (Left)     Patient location during evaluation: PACU Anesthesia Type: General Level of consciousness: awake Pain management: pain level controlled Vital Signs Assessment: post-procedure vital signs reviewed and stable Respiratory status: spontaneous breathing, nonlabored ventilation and respiratory function stable Cardiovascular status: blood pressure returned to baseline and stable Postop Assessment: no apparent nausea or vomiting Anesthetic complications: no   No notable events documented.  Last Vitals:  Vitals:   10/31/22 1645 10/31/22 1700  BP: 120/65   Pulse: 70 69  Resp: 14 15  Temp:    SpO2: 98% 93%    Last Pain:  Vitals:   10/31/22 1700  TempSrc:   PainSc: 0-No pain                 Nilda Simmer

## 2022-10-31 NOTE — Brief Op Note (Signed)
10/30/2022 - 10/31/2022  4:31 PM  PATIENT:  Gail Henry  77 y.o. female  PRE-OPERATIVE DIAGNOSIS:  Left ureteral stone  POST-OPERATIVE DIAGNOSIS:  Left ureteral stone  PROCEDURE:  Procedure(s): CYSTOSCOPY WITH RETROGRADE PYELOGRAM/URETERAL STENT PLACEMENT (Left)  SURGEON:  Surgeon(s) and Role:    Alexis Frock, MD - Primary  PHYSICIAN ASSISTANT:   ASSISTANTS: none   ANESTHESIA:   general  EBL:  miniaml   BLOOD ADMINISTERED:none  DRAINS: none   LOCAL MEDICATIONS USED:  NONE  SPECIMEN:  No Specimen  DISPOSITION OF SPECIMEN:  N/A  COUNTS:  YES  TOURNIQUET:  * No tourniquets in log *  DICTATION: .Other Dictation: Dictation Number [id number]  PLAN OF CARE: Admit to inpatient   PATIENT DISPOSITION:  PACU - hemodynamically stable.   Delay start of Pharmacological VTE agent (>24hrs) due to surgical blood loss or risk of bleeding: yes

## 2022-10-31 NOTE — Transfer of Care (Signed)
Immediate Anesthesia Transfer of Care Note  Patient: Gail Henry  Procedure(s) Performed: CYSTOSCOPY WITH RETROGRADE PYELOGRAM/URETERAL STENT PLACEMENT (Left)  Patient Location: PACU  Anesthesia Type:General  Level of Consciousness: awake, alert , oriented, and patient cooperative  Airway & Oxygen Therapy: Patient Spontanous Breathing and Patient connected to face mask oxygen  Post-op Assessment: Report given to RN and Post -op Vital signs reviewed and stable  Post vital signs: Reviewed and stable  Last Vitals:  Vitals Value Taken Time  BP 119/70 10/31/22 1638  Temp    Pulse 74 10/31/22 1640  Resp 16 10/31/22 1640  SpO2 100 % 10/31/22 1640  Vitals shown include unvalidated device data.  Last Pain:  Vitals:   10/31/22 1501  TempSrc:   PainSc: 3          Complications: No notable events documented.

## 2022-10-31 NOTE — Anesthesia Preprocedure Evaluation (Addendum)
Anesthesia Evaluation  Patient identified by MRN, date of birth, ID band Patient awake    Reviewed: Allergy & Precautions, NPO status , Patient's Chart, lab work & pertinent test results  History of Anesthesia Complications (+) PONV and history of anesthetic complications  Airway Mallampati: III  TM Distance: >3 FB Neck ROM: Full    Dental  (+) Dental Advisory Given,    Pulmonary neg pulmonary ROS   Pulmonary exam normal breath sounds clear to auscultation       Cardiovascular (-) hypertension(-) angina (-) Past MI, (-) Cardiac Stents and (-) CABG (-) dysrhythmias + Valvular Problems/Murmurs  Rhythm:Regular Rate:Normal  TTE 09/14/2020: Summary   1. The left ventricle is normal in size with upper normal wall thickness.    2. The left ventricular systolic function is normal, LVEF is visually  estimated at 60-65%.    3. There is grade I diastolic dysfunction (impaired relaxation).    4. The left atrium is mildly dilated in size.    5. The right ventricle is normal in size, with normal systolic function.    6. There is no pulmonary hypertension, estimated pulmonary artery systolic  pressure is 26 mmHg.     Neuro/Psych negative neurological ROS     GI/Hepatic Neg liver ROS,GERD  ,,Crohn's disease, diverticulosis   Endo/Other  negative endocrine ROS    Renal/GU ARFRenal disease (nephrolithiasis)     Musculoskeletal   Abdominal   Peds  Hematology negative hematology ROS (+)   Anesthesia Other Findings K 3.2  1cm left prox ureteral stone with moderate hydro by ER CT at [facility] 10/30/22. Cr 1.1, WBC 9, UA without infectious parameters but colic difficult to control.   Reproductive/Obstetrics                             Anesthesia Physical Anesthesia Plan  ASA: 2  Anesthesia Plan: General   Post-op Pain Management: Tylenol PO (pre-op)*   Induction: Intravenous  PONV Risk Score and  Plan: 3 and Ondansetron, Dexamethasone, Treatment may vary due to age or medical condition and Propofol infusion  Airway Management Planned: LMA  Additional Equipment:   Intra-op Plan:   Post-operative Plan: Extubation in OR  Informed Consent: I have reviewed the patients History and Physical, chart, labs and discussed the procedure including the risks, benefits and alternatives for the proposed anesthesia with the patient or authorized representative who has indicated his/her understanding and acceptance.     Dental advisory given  Plan Discussed with: CRNA and Anesthesiologist  Anesthesia Plan Comments: (Risks of general anesthesia discussed including, but not limited to, sore throat, hoarse voice, chipped/damaged teeth, injury to vocal cords, nausea and vomiting, allergic reactions, lung infection, heart attack, stroke, and death. All questions answered. )        Anesthesia Quick Evaluation

## 2022-11-01 ENCOUNTER — Other Ambulatory Visit (HOSPITAL_COMMUNITY): Payer: Self-pay

## 2022-11-01 ENCOUNTER — Encounter (HOSPITAL_COMMUNITY): Payer: Self-pay | Admitting: Gastroenterology

## 2022-11-01 ENCOUNTER — Encounter (HOSPITAL_COMMUNITY): Payer: Self-pay | Admitting: Urology

## 2022-11-01 DIAGNOSIS — N132 Hydronephrosis with renal and ureteral calculous obstruction: Secondary | ICD-10-CM | POA: Diagnosis not present

## 2022-11-01 LAB — BASIC METABOLIC PANEL
Anion gap: 8 (ref 5–15)
BUN: 12 mg/dL (ref 8–23)
CO2: 21 mmol/L — ABNORMAL LOW (ref 22–32)
Calcium: 8.6 mg/dL — ABNORMAL LOW (ref 8.9–10.3)
Chloride: 113 mmol/L — ABNORMAL HIGH (ref 98–111)
Creatinine, Ser: 0.6 mg/dL (ref 0.44–1.00)
GFR, Estimated: >60 mL/min (ref >60)
Glucose, Bld: 130 mg/dL — ABNORMAL HIGH (ref 70–99)
Potassium: 4 mmol/L (ref 3.5–5.1)
Sodium: 142 mmol/L (ref 135–145)

## 2022-11-01 LAB — CBC WITH DIFFERENTIAL/PLATELET
Abs Immature Granulocytes: 0.02 10*3/uL (ref 0.00–0.07)
Basophils Absolute: 0 10*3/uL (ref 0.0–0.1)
Basophils Relative: 0 %
Eosinophils Absolute: 0 10*3/uL (ref 0.0–0.5)
Eosinophils Relative: 0 %
HCT: 35.5 % — ABNORMAL LOW (ref 36.0–46.0)
Hemoglobin: 11.3 g/dL — ABNORMAL LOW (ref 12.0–15.0)
Immature Granulocytes: 0 %
Lymphocytes Relative: 12 %
Lymphs Abs: 0.6 10*3/uL — ABNORMAL LOW (ref 0.7–4.0)
MCH: 29.8 pg (ref 26.0–34.0)
MCHC: 31.8 g/dL (ref 30.0–36.0)
MCV: 93.7 fL (ref 80.0–100.0)
Monocytes Absolute: 0.1 10*3/uL (ref 0.1–1.0)
Monocytes Relative: 2 %
Neutro Abs: 4.6 10*3/uL (ref 1.7–7.7)
Neutrophils Relative %: 86 %
Platelets: 235 10*3/uL (ref 150–400)
RBC: 3.79 MIL/uL — ABNORMAL LOW (ref 3.87–5.11)
RDW: 13.5 % (ref 11.5–15.5)
WBC: 5.4 10*3/uL (ref 4.0–10.5)
nRBC: 0 % (ref 0.0–0.2)

## 2022-11-01 MED ORDER — OXYCODONE HCL 5 MG PO TABS
5.0000 mg | ORAL_TABLET | ORAL | 0 refills | Status: DC | PRN
  Filled 2022-11-01: qty 10, 2d supply, fill #0

## 2022-11-01 NOTE — Progress Notes (Signed)
OT Cancellation Note  Patient Details Name: Gail Henry MRN: [id number] DOB: 1946/03/30   Cancelled Treatment:    Reason Eval/Treat Not Completed: OT screened, no needs identified, will sign off Pt observed ambulating with PT, denies any concerns with ADLs and no formal OT eval needed at this time.  Layla Maw 11/01/2022, 10:23 AM

## 2022-11-01 NOTE — Discharge Summary (Signed)
Physician Discharge Summary  Gail Henry [id number] DOB: 1946-06-22 DOA: 10/30/2022  PCP: Caryl Bis, MD  Admit date: 10/30/2022 Discharge date: 11/01/2022 30 Day Unplanned Readmission Risk Score    Flowsheet Row ED to Hosp-Admission (Current) from 10/30/2022 in [facility]  30 Day Unplanned Readmission Risk Score (%) 9.91 Filed at 11/01/2022 0801       This score is the patient's risk of an unplanned readmission within 30 days of being discharged (0 -100%). The score is based on dignosis, age, lab data, medications, orders, and past utilization.   Low:  0-14.9   Medium: 15-21.9   High: 22-29.9   Extreme: 30 and above          Admitted From: Home Disposition: Home  Recommendations for Outpatient Follow-up:  Follow up with PCP in 1-2 weeks Please obtain BMP/CBC in one week Follow-up with urology per their recommended time and date. Please follow up with your PCP on the following pending results: Unresulted Labs (From admission, onward)    None         Home Health: None Equipment/Devices: None  Discharge Condition: Stable CODE STATUS: Full code Diet recommendation: Cardiac  Subjective: Seen and examined.  Has very minimal left flank pain.  No other complaint but overall feels better than yesterday and feels comfortable going home.  Granddaughter at bedside all were in agreement with the plan of discharge.  Brief/Interim Summary: This is a very pleasant 77 year old lady with history of Crohn's disease, diverticulosis who presented to the ED at [facility] with flank pain and was eventually diagnosed with left-sided hydronephrosis and 1 cm of left-sided ureteric stone.  Interestingly, UA was not indicative of UTI.  Patient had very mild AKI, her baseline creatinine appears to be around 0.8, but she presented with creatinine of 1.1, she was hydrated and creatinine is back to baseline.  She had mild hypokalemia which was replenished.   Since urology services were not available at [facility], she was transferred to [facility] after discussion with Dr. Tresa Moore of urology.  Patient underwent cystoscopy with retrograde pyelogram and ureteral stent placement on the left.  She is doing well postoperatively.  No complications.  No fever.  She is being discharged in stable condition.  She was also assessed by PT OT and no needs were identified.  She will follow-up with urology, she verbalized understanding of all of that.  Discharge plan was discussed with patient and/or family member and they verbalized understanding and agreed with it.  Discharge Diagnoses:  Principal Problem:   Hydronephrosis concurrent with and due to calculi of kidney and ureter Active Problems:   GERD (gastroesophageal reflux disease)   Hypokalemia   AKI (acute kidney injury)    Discharge Instructions   Allergies as of 11/01/2022       Reactions   Penicillins Hives   Dexilant [dexlansoprazole] Other (See Comments)   Muscle pain   Nexium [esomeprazole Magnesium] Nausea Only        Medication List     TAKE these medications    acetaminophen 500 MG tablet Commonly known as: TYLENOL Take 500 mg by mouth every 6 (six) hours as needed for moderate pain or headache.   docusate sodium 100 MG capsule Commonly known as: Colace Take 2 capsules (200 mg total) by mouth at bedtime. What changed:  when to take this reasons to take this   Entyvio 300 MG injection Generic drug: vedolizumab Inject 300 mg into the vein  every 8 (eight) weeks.   multivitamin with minerals tablet Take 2 tablets by mouth in the morning. forbia   nystatin-triamcinolone cream Commonly known as: MYCOLOG II Apply 1 Application topically 2 (two) times daily as needed (skin irritation (skin folds)).   oxyCODONE 5 MG immediate release tablet Commonly known as: Oxy IR/ROXICODONE Take 1 tablet (5 mg total) by mouth every 4 (four) hours as needed for moderate pain.    RABEprazole 20 MG tablet Commonly known as: ACIPHEX Take 1 tablet (20 mg total) by mouth daily before breakfast.   REFRESH RELIEVA OP Place 1-2 drops into both eyes 3 (three) times daily as needed (dry/irritated eyes.).   sodium chloride 0.65 % Soln nasal spray Commonly known as: OCEAN Place 1 spray into both nostrils in the morning and at bedtime.   Vitamin D3 50 MCG (2000 UT) Tabs Generic drug: Cholecalciferol Take 2,000 Units by mouth in the morning.        Follow-up Information     Caryl Bis, MD Follow up in 1 week(s).   Specialty: Family Medicine Contact information: [place] [address] [phone]                Allergies  Allergen Reactions   Penicillins Hives   Dexilant [Dexlansoprazole] Other (See Comments)    Muscle pain   Nexium [Esomeprazole Magnesium] Nausea Only    Consultations: Urology   Procedures/Studies: DG C-Arm 1-60 Min-No Report  Result Date: 10/31/2022 Fluoroscopy was utilized by the requesting physician.  No radiographic interpretation.   CT ABDOMEN PELVIS W CONTRAST  Result Date: 10/31/2022 CLINICAL DATA:  Left lower quadrant abdominal pain. EXAM: CT ABDOMEN AND PELVIS WITH CONTRAST TECHNIQUE: Multidetector CT imaging of the abdomen and pelvis was performed using the standard protocol following bolus administration of intravenous contrast. RADIATION DOSE REDUCTION: This exam was performed according to the departmental dose-optimization program which includes automated exposure control, adjustment of the mA and/or kV according to patient size and/or use of iterative reconstruction technique. CONTRAST:  128mL OMNIPAQUE IOHEXOL 300 MG/ML  SOLN COMPARISON:  CT abdomen pelvis dated 11/18/2015. FINDINGS: Lower chest: The visualized lung bases are clear. No intra-abdominal free air or free fluid. Hepatobiliary: Several liver cysts and additional smaller hypodense lesions which are too small to characterize. No within the  tension. Cholecystectomy. Pancreas: Unremarkable. No pancreatic ductal dilatation or surrounding inflammatory changes. Spleen: Normal in size without focal abnormality. Adrenals/Urinary Tract: The adrenal glands are unremarkable. There is a 1 cm stone in the proximal left ureter with mild left hydronephrosis. Additional 4 mm nonobstructing left renal inferior pole calculus. There is mild left perinephric edema and stranding. Correlation with urinalysis recommended to exclude superimposed UTI. There is delayed excretion of contrast by the left kidney. The right kidney, right ureter, and urinary bladder appear unremarkable. Stomach/Bowel: There is sigmoid diverticulosis without active inflammatory changes. There is no bowel obstruction. Appendectomy. Vascular/Lymphatic: Mild aortoiliac atherosclerotic disease. The IVC is unremarkable. No portal venous gas. There is no adenopathy. Reproductive: The uterus is anteverted. Small uterine fibroids. No adnexal masses. Other: Broad-based fat containing ventral supraumbilical hernia. Musculoskeletal: No acute osseous pathology. IMPRESSION: 1. A 1 cm stone in the proximal left ureter with mild left hydronephrosis. Correlation with urinalysis recommended to exclude superimposed UTI. 2. Additional 4 mm nonobstructing left renal inferior pole calculus. 3. Sigmoid diverticulosis. No bowel obstruction. 4.  Aortic Atherosclerosis (ICD10-I70.0). Electronically Signed   By: Anner Crete M.D.   On: 10/31/2022 00:11  Discharge Exam: Vitals:   11/01/22 0147 11/01/22 0556  BP: (!) 145/78 130/72  Pulse: 72 62  Resp: 19 14  Temp: 97.9 F (36.6 C) 98.5 F (36.9 C)  SpO2: 96% 100%   Vitals:   10/31/22 1726 10/31/22 2158 11/01/22 0147 11/01/22 0556  BP: 139/87 119/67 (!) 145/78 130/72  Pulse: 72 73 72 62  Resp: 16 19 19 14   Temp: 99 F (37.2 C) 99 F (37.2 C) 97.9 F (36.6 C) 98.5 F (36.9 C)  TempSrc: Oral Oral Oral Oral  SpO2: 97% 95% 96% 100%  Weight:       Height:        General: Pt is alert, awake, not in acute distress Cardiovascular: RRR, S1/S2 +, no rubs, no gallops Respiratory: CTA bilaterally, no wheezing, no rhonchi Abdominal: Soft, NT, ND, bowel sounds + very mild left CVA tenderness Extremities: no edema, no cyanosis    The results of significant diagnostics from this hospitalization (including imaging, microbiology, ancillary and laboratory) are listed below for reference.     Microbiology: No results found for this or any previous visit (from the past 240 hour(s)).   Labs: BNP (last 3 results) No results for input(s): "BNP" in the last 8760 hours. Basic Metabolic Panel: Recent Labs  Lab 10/30/22 2000 10/31/22 0409 11/01/22 0406  NA 137 138 142  K 3.4* 3.2* 4.0  CL 104 107 113*  CO2 24 22 21*  GLUCOSE 127* 126* 130*  BUN 12 11 12   CREATININE 0.88 1.11* 0.60  CALCIUM 8.8* 8.3* 8.6*  MG  --  1.7  --    Liver Function Tests: Recent Labs  Lab 10/30/22 2000 10/31/22 0409  AST 44* 33  ALT 30 25  ALKPHOS 87 81  BILITOT 0.6 0.8  PROT 7.2 6.4*  ALBUMIN 3.8 3.3*   No results for input(s): "LIPASE", "AMYLASE" in the last 168 hours. No results for input(s): "AMMONIA" in the last 168 hours. CBC: Recent Labs  Lab 10/30/22 2000 10/31/22 0409 11/01/22 0406  WBC 11.3* 9.3 5.4  NEUTROABS 8.9* 7.4 4.6  HGB 13.4 12.3 11.3*  HCT 39.9 35.8* 35.5*  MCV 89.1 88.0 93.7  PLT 301 255 235   Cardiac Enzymes: No results for input(s): "CKTOTAL", "CKMB", "CKMBINDEX", "TROPONINI" in the last 168 hours. BNP: Invalid input(s): "POCBNP" CBG: No results for input(s): "GLUCAP" in the last 168 hours. D-Dimer No results for input(s): "DDIMER" in the last 72 hours. Hgb A1c No results for input(s): "HGBA1C" in the last 72 hours. Lipid Profile No results for input(s): "CHOL", "HDL", "LDLCALC", "TRIG", "CHOLHDL", "LDLDIRECT" in the last 72 hours. Thyroid function studies No results for input(s): "TSH", "T4TOTAL", "T3FREE",  "THYROIDAB" in the last 72 hours.  Invalid input(s): "FREET3" Anemia work up No results for input(s): "VITAMINB12", "FOLATE", "FERRITIN", "TIBC", "IRON", "RETICCTPCT" in the last 72 hours. Urinalysis    Component Value Date/Time   COLORURINE YELLOW 10/30/2022 0145   APPEARANCEUR CLEAR 10/30/2022 0145   LABSPEC 1.030 10/30/2022 0145   PHURINE 6.0 10/30/2022 0145   GLUCOSEU NEGATIVE 10/30/2022 0145   HGBUR SMALL (A) 10/30/2022 0145   BILIRUBINUR NEGATIVE 10/30/2022 0145   KETONESUR 20 (A) 10/30/2022 0145   PROTEINUR NEGATIVE 10/30/2022 0145   NITRITE NEGATIVE 10/30/2022 0145   LEUKOCYTESUR NEGATIVE 10/30/2022 0145   Sepsis Labs Recent Labs  Lab 10/30/22 2000 10/31/22 0409 11/01/22 0406  WBC 11.3* 9.3 5.4   Microbiology No results found for this or any previous visit (from the past 240 hour(s)).  Time coordinating discharge: Over 30 minutes  SIGNED:   Darliss Cheney, MD  [facility] 11/01/2022, 11:22 AM *Please note that this is a verbal dictation therefore any spelling or grammatical errors are due to the "[employer] [employer]" system interpretation. If 7PM-7AM, please contact night-coverage [email or web address]

## 2022-11-01 NOTE — Plan of Care (Signed)

## 2022-11-01 NOTE — Evaluation (Signed)
Physical Therapy Evaluation Patient Details Name: VALENTINE ANTONY MRN: [id number] DOB: 04-Oct-1945 Today's Date: 11/01/2022  History of Present Illness  Pt is a 77 y/o F admitted on 10/30/22 after presenting to the ED with c/c of flank pain & LLE pain. Pt is being treated for hydronephrosis concurrent with & due to calculi of kidney & ureter. CT scan showed a 1 cm stone in proximal L ureter with mild hydronephrosis. Pt underwent cystoscopy with retrograde pyelogram/ureteral stent placement (left) on 10/31/22. PMH: abdominal pain, Crohn's disease, diverticulosis  Clinical Impression  Pt seen for PT evaluation with pt agreeable, granddaughter present for session. Prior to admission pt was independent, living alone, & still working part time. On this date, pt is independent with bed mobility & transfers as well as gait around unit without AD. At this time, pt does not demonstrate any needs for acute PT. PT to complete current orders at this time. Pt encouraged to continue mobilizing while in hospital.       Recommendations for follow up therapy are one component of a multi-disciplinary discharge planning process, led by the attending physician.  Recommendations may be updated based on patient status, additional functional criteria and insurance authorization.  Follow Up Recommendations       Assistance Recommended at Discharge PRN  Patient can return home with the following       Equipment Recommendations None recommended by PT  Recommendations for Other Services       Functional Status Assessment Patient has not had a recent decline in their functional status     Precautions / Restrictions Precautions Precautions: None Restrictions Weight Bearing Restrictions: No      Mobility  Bed Mobility Overal bed mobility: Independent             General bed mobility comments: supine>sit    Transfers Overall transfer level: Independent Equipment used: None               General  transfer comment: STS from EOB    Ambulation/Gait Ambulation/Gait assistance: Independent Gait Distance (Feet):  (>300 ft) Assistive device: None Gait Pattern/deviations: Decreased step length - right, Decreased step length - left Gait velocity: slightly decreased     General Gait Details: Pt engaged in gait speed increases & stops upon cuing as well as head turns without LOB.  Stairs            Wheelchair Mobility    Modified Rankin (Stroke Patients Only)       Balance Overall balance assessment: Independent                                           Pertinent Vitals/Pain Pain Assessment Pain Assessment: Faces Faces Pain Scale: Hurts a little bit Pain Location: L flank pain Pain Descriptors / Indicators: Discomfort Pain Intervention(s): Monitored during session    Home Living Family/patient expects to be discharged to:: Private residence Living Arrangements: Alone Available Help at Discharge: Family;Available PRN/intermittently Type of Home: House   Entrance Stairs-Rails: Left Entrance Stairs-Number of Steps: 5-6 steps at front with L rail, 4 steps without rails at garage   Home Layout: Two level;Able to live on main level with bedroom/bathroom        Prior Function Prior Level of Function : Independent/Modified Independent;Driving;Working/employed             Mobility Comments: Independent without AD, driving,  works part time Health and safety inspector        Extremity/Trunk Assessment   Upper Extremity Assessment Upper Extremity Assessment: Overall WFL for tasks assessed    Lower Extremity Assessment Lower Extremity Assessment: Overall WFL for tasks assessed       Communication   Communication: No difficulties  Cognition Arousal/Alertness: Awake/alert Behavior During Therapy: WFL for tasks assessed/performed Overall Cognitive Status: Within Functional Limits for tasks assessed                                           General Comments      Exercises     Assessment/Plan    PT Assessment Patient does not need any further PT services  PT Problem List         PT Treatment Interventions      PT Goals (Current goals can be found in the Care Plan section)  Acute Rehab PT Goals Patient Stated Goal: decreased pain, return to PLOF PT Goal Formulation: With patient Time For Goal Achievement: 11/15/22 Potential to Achieve Goals: Good    Frequency       Co-evaluation               AM-PAC PT "6 Clicks" Mobility  Outcome Measure Help needed turning from your back to your side while in a flat bed without using bedrails?: None Help needed moving from lying on your back to sitting on the side of a flat bed without using bedrails?: None Help needed moving to and from a bed to a chair (including a wheelchair)?: None Help needed standing up from a chair using your arms (e.g., wheelchair or bedside chair)?: None Help needed to walk in hospital room?: None Help needed climbing 3-5 steps with a railing? : None 6 Click Score: 24    End of Session   Activity Tolerance: Patient tolerated treatment well Patient left:  (ambulating into room with granddaughter present)        Time: [id number] PT Time Calculation (min) (ACUTE ONLY): 10 min   Charges:   PT Evaluation $PT Eval Low Complexity: 1 Low          Lavone Nian, PT, DPT 11/01/22, 10:30 AM   Waunita Schooner 11/01/2022, 10:29 AM

## 2022-11-02 ENCOUNTER — Other Ambulatory Visit: Payer: Self-pay | Admitting: Urology

## 2022-11-03 NOTE — Progress Notes (Signed)
Sent message, via epic in basket, requesting orders in epic from surgeon.  

## 2022-11-09 NOTE — Progress Notes (Addendum)
COVID Vaccine received:  []  No [x]  Yes Date of any COVID positive Test in last 90 days:  None  PCP - Donzetta Sprung, MD Cardiologist - none GI- Dr. [name]    Chest x-ray -  EKG -  day of PST Stress Test -  ECHO - 09-14-2020  CE done at [facility] Cardiac Cath -   PCR screen: []  Ordered & Completed           []   No Order but Needs PROFEND           [x]   N/A for this surgery  Surgery Plan:  [x]  Ambulatory                            []  Outpatient in bed                            []  Admit  Anesthesia:    [x]  General  []  Spinal                           []   Choice []   MAC  Bowel Prep - [x]  No  []   Yes ______  Pacemaker / ICD device [x]  No []  Yes   Spinal Cord Stimulator:[x]  No []  Yes       History of Sleep Apnea? [x]  No []  Yes   CPAP used?- [x]  No []  Yes    Does the patient monitor blood sugar?          []  No []  Yes  [x]  N/A  Patient has: [x]  NO Hx DM   []  Pre-DM                 []  DM1  []   DM2  Blood Thinner / Instructions:  None Aspirin Instructions: None  ERAS Protocol Ordered: [x]  No  []  Yes Patient is to be NPO after:   midnight  Activity level: Patient is unable to climb a flight of stairs without difficulty; [x]  No CP  [x]  No SOB.  Patient can perform ADLs without assistance.   Anesthesia review: GERD, Crohn's, anemia, hx cardiac murmur (ECHO 2002)  Patient denies shortness of breath, fever, cough and chest pain at PAT appointment.  Patient verbalized understanding and agreement to the Pre-Surgical Instructions that were given to them at this PAT appointment. Patient was also educated of the need to review these PAT instructions again prior to her surgery.I reviewed the appropriate phone numbers to call if they have any and questions or concerns.

## 2022-11-09 NOTE — Patient Instructions (Addendum)
SURGICAL WAITING ROOM VISITATION Patients having surgery or a procedure may have no more than 2 support people in the waiting area - these visitors may rotate in the visitor waiting room.   Due to an increase in RSV and influenza rates and associated hospitalizations, children ages 4512 and under may not visit patients in [facility] hospitals. If the patient needs to stay at the hospital during part of their recovery, the visitor guidelines for inpatient rooms apply.  PRE-OP VISITATION  Pre-op nurse will coordinate an appropriate time for 1 support person to accompany the patient in pre-op.  This support person may not rotate.  This visitor will be contacted when the time is appropriate for the visitor to come back in the pre-op area.  Please refer to the [facility] website for the visitor guidelines for Inpatients (after your surgery is over and you are in a regular room).  You are not required to quarantine at this time prior to your surgery. However, you must do this: Hand Hygiene often Do NOT share personal items Notify your provider if you are in close contact with someone who has COVID or you develop fever 100.4 or greater, new onset of sneezing, cough, sore throat, shortness of breath or body aches.  If you test positive for Covid or have been in contact with anyone that has tested positive in the last 10 days please notify you surgeon.    Your procedure is scheduled on:  Wednesday  November 15, 2022  [facility] Main Entrance: [address] where the [employer] Parking is available.   Report to admitting at:  1:15  PM  +++++Call this number if you have any questions or problems the morning of surgery [phone]  DO NOT EAT OR DRINK ANYTHING AFTER MIDNIGHT THE NIGHT PRIOR TO YOUR SURGERY / PROCEDURE.   FOLLOW BOWEL PREP AND ANY ADDITIONAL PRE OP INSTRUCTIONS YOU RECEIVED FROM YOUR SURGEON'S OFFICE!!!   Oral Hygiene is also important to reduce your risk of  infection.        Remember - BRUSH YOUR TEETH THE MORNING OF SURGERY WITH YOUR REGULAR TOOTHPASTE  Do NOT smoke after Midnight the night before surgery.  Take ONLY these medicines the morning of surgery with A SIP OF WATER: Rabeprazole (Aciphex) You may take oxycodone or Tylenol if needed for pain,  You may use your Eye Drops if needed.                     You may not have any metal on your body including hair pins, jewelry, and body piercing  Do not wear make-up, lotions, powders, perfumes or deodorant  Men may shave face and neck.  You may bring a small overnight bag with you on the day of surgery, only pack items that are not valuable. [facility] IS NOT RESPONSIBLE   FOR VALUABLES THAT ARE LOST OR STOLEN.   Patients discharged on the day of surgery will not be allowed to drive home.  Someone NEEDS to stay with you for the first 24 hours after anesthesia.  Do not bring your home medications to the hospital. The Pharmacy will dispense medications listed on your medication list to you during your admission in the Hospital.  Please read over the following fact sheets you were given: IF YOU HAVE QUESTIONS ABOUT YOUR PRE-OP INSTRUCTIONS, PLEASE CALL [phone].   [place] - Preparing for Surgery Before surgery, you can play an important role.  Because skin  is not sterile, your skin needs to be as free of germs as possible.  You can reduce the number of germs on your skin by washing with CHG (chlorahexidine gluconate) soap before surgery.  CHG is an antiseptic cleaner which kills germs and bonds with the skin to continue killing germs even after washing. Please DO NOT use if you have an allergy to CHG or antibacterial soaps.  If your skin becomes reddened/irritated stop using the CHG and inform your nurse when you arrive at Short Stay. Do not shave (including legs and underarms) for at least 48 hours prior to the first CHG shower.  You may shave your face/neck.  Please follow these  instructions carefully:  1.  Shower with CHG Soap the night before surgery and the  morning of surgery.  2.  If you choose to wash your hair, wash your hair first as usual with your normal  shampoo.  3.  After you shampoo, rinse your hair and body thoroughly to remove the shampoo.                             4.  Use CHG as you would any other liquid soap.  You can apply chg directly to the skin and wash.  Gently with a scrungie or clean washcloth.  5.  Apply the CHG Soap to your body ONLY FROM THE NECK DOWN.   Do not use on face/ open                           Wound or open sores. Avoid contact with eyes, ears mouth and genitals (private parts).                       Wash face,  Genitals (private parts) with your normal soap.             6.  Wash thoroughly, paying special attention to the area where your  surgery  will be performed.  7.  Thoroughly rinse your body with warm water from the neck down.  8.  DO NOT shower/wash with your normal soap after using and rinsing off the CHG Soap.            9.  Pat yourself dry with a clean towel.            10.  Wear clean pajamas.            11.  Place clean sheets on your bed the night of your first shower and do not  sleep with pets.  ON THE DAY OF SURGERY : Do not apply any lotions/deodorants the morning of surgery.  Please wear clean clothes to the hospital/surgery center.    FAILURE TO FOLLOW THESE INSTRUCTIONS MAY RESULT IN THE CANCELLATION OF YOUR SURGERY  PATIENT SIGNATURE_________________________________  NURSE SIGNATURE__________________________________  ________________________________________________________________________

## 2022-11-10 ENCOUNTER — Other Ambulatory Visit: Payer: Self-pay

## 2022-11-10 ENCOUNTER — Encounter (HOSPITAL_COMMUNITY): Payer: Self-pay

## 2022-11-10 ENCOUNTER — Encounter (HOSPITAL_COMMUNITY)
Admission: RE | Admit: 2022-11-10 | Discharge: 2022-11-10 | Disposition: A | Discharge status: Home / Self Care | Payer: Medicare Other | Source: Ambulatory Visit | Attending: Anesthesiology | Admitting: Anesthesiology

## 2022-11-10 VITALS — BP 133/75 | HR 69 | Temp 98.7°F | Resp 16 | Ht 66.0 in | Wt 168.0 lb

## 2022-11-10 DIAGNOSIS — Z01810 Encounter for preprocedural cardiovascular examination: Secondary | ICD-10-CM | POA: Insufficient documentation

## 2022-11-10 DIAGNOSIS — R011 Cardiac murmur, unspecified: Secondary | ICD-10-CM | POA: Insufficient documentation

## 2022-11-10 HISTORY — DX: Personal history of urinary calculi: Z87.442

## 2022-11-10 HISTORY — DX: Anemia, unspecified: D64.9

## 2022-11-10 HISTORY — DX: Unspecified osteoarthritis, unspecified site: M19.90

## 2022-11-10 HISTORY — DX: Cardiac murmur, unspecified: R01.1

## 2022-11-13 ENCOUNTER — Other Ambulatory Visit (INDEPENDENT_AMBULATORY_CARE_PROVIDER_SITE_OTHER): Payer: Self-pay | Admitting: Internal Medicine

## 2022-11-14 NOTE — Progress Notes (Signed)
Pt verbalizes understanding of 1245 pm arrival to [facility] admitting 11/15/22.

## 2022-11-15 ENCOUNTER — Other Ambulatory Visit: Payer: Self-pay

## 2022-11-15 ENCOUNTER — Ambulatory Visit (HOSPITAL_COMMUNITY)
Admission: RE | Admit: 2022-11-15 | Discharge: 2022-11-15 | Disposition: A | Discharge status: Home / Self Care | Payer: Medicare Other | Source: Ambulatory Visit | Attending: Urology | Admitting: Urology

## 2022-11-15 ENCOUNTER — Encounter (HOSPITAL_COMMUNITY): Payer: Self-pay | Admitting: Urology

## 2022-11-15 ENCOUNTER — Other Ambulatory Visit: Payer: Self-pay | Admitting: Urology

## 2022-11-15 ENCOUNTER — Ambulatory Visit (HOSPITAL_COMMUNITY): Payer: Medicare Other | Admitting: Anesthesiology

## 2022-11-15 ENCOUNTER — Ambulatory Visit (HOSPITAL_COMMUNITY): Payer: Medicare Other

## 2022-11-15 ENCOUNTER — Encounter (HOSPITAL_COMMUNITY)
Admission: RE | Disposition: A | Discharge status: Home / Self Care | Payer: Self-pay | Source: Ambulatory Visit | Attending: Urology

## 2022-11-15 DIAGNOSIS — K5090 Crohn's disease, unspecified, without complications: Secondary | ICD-10-CM | POA: Insufficient documentation

## 2022-11-15 DIAGNOSIS — K5790 Diverticulosis of intestine, part unspecified, without perforation or abscess without bleeding: Secondary | ICD-10-CM | POA: Diagnosis not present

## 2022-11-15 DIAGNOSIS — K219 Gastro-esophageal reflux disease without esophagitis: Secondary | ICD-10-CM | POA: Diagnosis not present

## 2022-11-15 DIAGNOSIS — Z87442 Personal history of urinary calculi: Secondary | ICD-10-CM | POA: Diagnosis not present

## 2022-11-15 DIAGNOSIS — M1990 Unspecified osteoarthritis, unspecified site: Secondary | ICD-10-CM | POA: Insufficient documentation

## 2022-11-15 DIAGNOSIS — N202 Calculus of kidney with calculus of ureter: Secondary | ICD-10-CM | POA: Diagnosis present

## 2022-11-15 HISTORY — PX: HOLMIUM LASER APPLICATION: SHX5852

## 2022-11-15 HISTORY — PX: CYSTOSCOPY WITH RETROGRADE PYELOGRAM, URETEROSCOPY AND STENT PLACEMENT: SHX5789

## 2022-11-15 SURGERY — CYSTOURETEROSCOPY, WITH RETROGRADE PYELOGRAM AND STENT INSERTION
Anesthesia: General | Laterality: Left

## 2022-11-15 MED ORDER — OXYCODONE HCL 5 MG PO TABS: 5.0000 mg | ORAL_TABLET | Freq: Once | ORAL | Status: AC

## 2022-11-15 MED ORDER — FENTANYL CITRATE (PF) 100 MCG/2ML IJ SOLN
INTRAMUSCULAR | Status: AC
  Filled 2022-11-15: qty 2

## 2022-11-15 MED ORDER — PROPOFOL 10 MG/ML IV BOLUS: INTRAVENOUS | Status: DC | PRN

## 2022-11-15 MED ORDER — MIDAZOLAM HCL 2 MG/2ML IJ SOLN
INTRAMUSCULAR | Status: AC
  Filled 2022-11-15: qty 2

## 2022-11-15 MED ORDER — KETOROLAC TROMETHAMINE 10 MG PO TABS: 10.0000 mg | ORAL_TABLET | Freq: Three times a day (TID) | ORAL | 0 refills | Status: DC | PRN

## 2022-11-15 MED ORDER — KETOROLAC TROMETHAMINE 15 MG/ML IJ SOLN
15.0000 mg | Freq: Once | INTRAMUSCULAR | Status: AC
  Administered 2022-11-15: 15 mg via INTRAVENOUS

## 2022-11-15 MED ORDER — AMISULPRIDE (ANTIEMETIC) 5 MG/2ML IV SOLN
10.0000 mg | Freq: Once | INTRAVENOUS | Status: AC | PRN
  Administered 2022-11-15: 10 mg via INTRAVENOUS

## 2022-11-15 MED ORDER — FENTANYL CITRATE PF 50 MCG/ML IJ SOSY
PREFILLED_SYRINGE | INTRAMUSCULAR | Status: AC
  Administered 2022-11-15: 50 ug via INTRAVENOUS
  Filled 2022-11-15: qty 1

## 2022-11-15 MED ORDER — SULFAMETHOXAZOLE-TRIMETHOPRIM 800-160 MG PO TABS: 1.0000 | ORAL_TABLET | Freq: Every day | ORAL | 0 refills | Status: DC

## 2022-11-15 MED ORDER — ONDANSETRON HCL 4 MG/2ML IJ SOLN
INTRAMUSCULAR | Status: DC | PRN
  Administered 2022-11-15: 4 mg via INTRAVENOUS

## 2022-11-15 MED ORDER — FENTANYL CITRATE (PF) 100 MCG/2ML IJ SOLN
INTRAMUSCULAR | Status: DC | PRN
  Administered 2022-11-15 (×2): 50 ug via INTRAVENOUS

## 2022-11-15 MED ORDER — ORAL CARE MOUTH RINSE: 15.0000 mL | Freq: Once | OROMUCOSAL | Status: AC

## 2022-11-15 MED ORDER — MIDAZOLAM HCL 5 MG/5ML IJ SOLN
INTRAMUSCULAR | Status: DC | PRN
  Administered 2022-11-15: 2 mg via INTRAVENOUS

## 2022-11-15 MED ORDER — OXYCODONE HCL 5 MG PO TABS: 5.0000 mg | ORAL_TABLET | ORAL | 0 refills | Status: DC | PRN

## 2022-11-15 MED ORDER — ONDANSETRON HCL 4 MG/2ML IJ SOLN
INTRAMUSCULAR | Status: AC
  Filled 2022-11-15: qty 2

## 2022-11-15 MED ORDER — FENTANYL CITRATE PF 50 MCG/ML IJ SOSY
25.0000 ug | PREFILLED_SYRINGE | INTRAMUSCULAR | Status: DC | PRN
  Administered 2022-11-15: 50 ug via INTRAVENOUS

## 2022-11-15 MED ORDER — OXYCODONE HCL 5 MG PO TABS
ORAL_TABLET | ORAL | Status: AC
  Administered 2022-11-15: 5 mg via ORAL
  Filled 2022-11-15: qty 1

## 2022-11-15 MED ORDER — DEXAMETHASONE SODIUM PHOSPHATE 10 MG/ML IJ SOLN
INTRAMUSCULAR | Status: DC | PRN
  Administered 2022-11-15: 5 mg via INTRAVENOUS

## 2022-11-15 MED ORDER — CHLORHEXIDINE GLUCONATE 0.12 % MT SOLN
15.0000 mL | Freq: Once | OROMUCOSAL | Status: AC
  Administered 2022-11-15: 15 mL via OROMUCOSAL

## 2022-11-15 MED ORDER — LIDOCAINE HCL (CARDIAC) PF 50 MG/5ML IV SOSY
PREFILLED_SYRINGE | INTRAVENOUS | Status: DC | PRN
  Administered 2022-11-15: 60 mg via INTRAVENOUS

## 2022-11-15 MED ORDER — LIDOCAINE HCL (PF) 2 % IJ SOLN
INTRAMUSCULAR | Status: AC
  Filled 2022-11-15: qty 5

## 2022-11-15 MED ORDER — OXYBUTYNIN CHLORIDE 5 MG PO TABS: 5.0000 mg | ORAL_TABLET | Freq: Three times a day (TID) | ORAL | 0 refills | Status: DC | PRN

## 2022-11-15 MED ORDER — SODIUM CHLORIDE 0.9 % IR SOLN
Status: DC | PRN
  Administered 2022-11-15: 3000 mL via INTRAVESICAL

## 2022-11-15 MED ORDER — ACETAMINOPHEN 500 MG PO TABS: 1000.0000 mg | ORAL_TABLET | Freq: Once | ORAL | Status: AC

## 2022-11-15 MED ORDER — IOHEXOL 300 MG/ML  SOLN
INTRAMUSCULAR | Status: DC | PRN
  Administered 2022-11-15: 20 mL via URETHRAL

## 2022-11-15 MED ORDER — PROPOFOL 10 MG/ML IV BOLUS
INTRAVENOUS | Status: AC
  Filled 2022-11-15: qty 20

## 2022-11-15 MED ORDER — FENTANYL CITRATE PF 50 MCG/ML IJ SOSY
PREFILLED_SYRINGE | INTRAMUSCULAR | Status: AC
  Administered 2022-11-15: 50 ug via INTRAVENOUS
  Filled 2022-11-15: qty 2

## 2022-11-15 MED ORDER — LACTATED RINGERS IV SOLN: INTRAVENOUS | Status: DC

## 2022-11-15 MED ORDER — DEXAMETHASONE SODIUM PHOSPHATE 10 MG/ML IJ SOLN
INTRAMUSCULAR | Status: AC
  Filled 2022-11-15: qty 1

## 2022-11-15 MED ORDER — KETOROLAC TROMETHAMINE 15 MG/ML IJ SOLN
INTRAMUSCULAR | Status: AC
  Filled 2022-11-15: qty 1

## 2022-11-15 MED ORDER — PROPOFOL 10 MG/ML IV BOLUS
INTRAVENOUS | Status: DC | PRN
  Administered 2022-11-15: 110 mg via INTRAVENOUS

## 2022-11-15 MED ORDER — AMISULPRIDE (ANTIEMETIC) 5 MG/2ML IV SOLN
INTRAVENOUS | Status: AC
  Filled 2022-11-15: qty 4

## 2022-11-15 MED ORDER — GENTAMICIN SULFATE 40 MG/ML IJ SOLN
5.0000 mg/kg | INTRAVENOUS | Status: AC
  Administered 2022-11-15: 330.4 mg via INTRAVENOUS
  Filled 2022-11-15: qty 8.25

## 2022-11-15 MED ORDER — ACETAMINOPHEN 500 MG PO TABS
ORAL_TABLET | ORAL | Status: AC
  Administered 2022-11-15: 1000 mg via ORAL
  Filled 2022-11-15: qty 2

## 2022-11-15 SURGICAL SUPPLY — 25 items
BAG URO CATCHER STRL LF (MISCELLANEOUS) ×2 IMPLANT
BASKET LASER NITINOL 1.9FR (BASKET) IMPLANT
BSKT STON RTRVL 120 1.9FR (BASKET) ×1
CATH URETL OPEN END 6FR 70 (CATHETERS) ×2 IMPLANT
CLOTH BEACON ORANGE TIMEOUT ST (SAFETY) ×2 IMPLANT
EXTRACTOR STONE 1.7FRX115CM (UROLOGICAL SUPPLIES) IMPLANT
GLOVE SURG LX STRL 7.5 STRW (GLOVE) ×2 IMPLANT
GOWN STRL REUS W/ TWL XL LVL3 (GOWN DISPOSABLE) ×2 IMPLANT
GOWN STRL REUS W/TWL XL LVL3 (GOWN DISPOSABLE) ×2
GUIDEWIRE ANG ZIPWIRE 038X150 (WIRE) ×2 IMPLANT
GUIDEWIRE STR DUAL SENSOR (WIRE) ×2 IMPLANT
KIT TURNOVER KIT A (KITS) IMPLANT
LASER FIB FLEXIVA PULSE ID 365 (Laser) IMPLANT
LASER FIB FLEXIVA PULSE ID 550 (Laser) IMPLANT
LASER FIB FLEXIVA PULSE ID 910 (Laser) IMPLANT
MANIFOLD NEPTUNE II (INSTRUMENTS) ×2 IMPLANT
PACK CYSTO (CUSTOM PROCEDURE TRAY) ×2 IMPLANT
SHEATH NAVIGATOR HD 11/13X28 (SHEATH) IMPLANT
SHEATH NAVIGATOR HD 11/13X36 (SHEATH) IMPLANT
STENT POLARIS 5FRX26 (STENTS) IMPLANT
TRACTIP FLEXIVA PULS ID 200XHI (Laser) IMPLANT
TRACTIP FLEXIVA PULSE ID 200 (Laser) ×1
TUBE PU 8FR 16IN ENFIT (TUBING) ×2 IMPLANT
TUBING CONNECTING 10 (TUBING) ×2 IMPLANT
TUBING UROLOGY SET (TUBING) ×2 IMPLANT

## 2022-11-15 NOTE — Anesthesia Procedure Notes (Signed)
Procedure Name: LMA Insertion Date/Time: 11/15/2022 2:43 PM  Performed by: Johnette Abraham, CRNAPre-anesthesia Checklist: Patient identified, Emergency Drugs available, Suction available and Patient being monitored Patient Re-evaluated:Patient Re-evaluated prior to induction Oxygen Delivery Method: Circle System Utilized Preoxygenation: Pre-oxygenation with 100% oxygen Induction Type: IV induction Ventilation: Mask ventilation without difficulty LMA: LMA inserted LMA Size: 4.0 Number of attempts: 1 Airway Equipment and Method: Bite block Placement Confirmation: positive ETCO2 Tube secured with: Tape Dental Injury: Teeth and Oropharynx as per pre-operative assessment

## 2022-11-15 NOTE — OR Nursing (Signed)
Patient complained about nausea gave medication and it is better.  Then patient complained of pain being really bad gave pain medication.  Patient states that the medicine took the edge off but still does not want to go home.  She asked me to call the doctor.  Dr. Berneice Heinrich not available called the office and waiting for on call doctor to call me back.

## 2022-11-15 NOTE — Anesthesia Preprocedure Evaluation (Addendum)
Anesthesia Evaluation  Patient identified by MRN, date of birth, ID band Patient awake    Reviewed: Allergy & Precautions, NPO status , Patient's Chart, lab work & pertinent test results  History of Anesthesia Complications (+) PONV and history of anesthetic complications  Airway Mallampati: II  TM Distance: >3 FB Neck ROM: Full    Dental no notable dental hx. (+) Dental Advisory Given, Teeth Intact,    Pulmonary neg pulmonary ROS   Pulmonary exam normal breath sounds clear to auscultation       Cardiovascular (-) hypertension(-) angina (-) Past MI, (-) Cardiac Stents and (-) CABG Normal cardiovascular exam(-) dysrhythmias + Valvular Problems/Murmurs  Rhythm:Regular Rate:Normal  TTE 09/14/2020: Summary   1. The left ventricle is normal in size with upper normal wall thickness.    2. The left ventricular systolic function is normal, LVEF is visually  estimated at 60-65%.    3. There is grade I diastolic dysfunction (impaired relaxation).    4. The left atrium is mildly dilated in size.    5. The right ventricle is normal in size, with normal systolic function.    6. There is no pulmonary hypertension, estimated pulmonary artery systolic  pressure is 26 mmHg.     Neuro/Psych negative neurological ROS     GI/Hepatic Neg liver ROS,GERD  ,,Crohn's disease, diverticulosis   Endo/Other  negative endocrine ROS    Renal/GU ARFRenal disease (nephrolithiasis)     Musculoskeletal  (+) Arthritis ,    Abdominal   Peds  Hematology  (+) Blood dyscrasia, anemia   Anesthesia Other Findings K 3.2  1cm left prox ureteral stone with moderate hydro by ER CT at [facility] 10/30/22. Cr 1.1, WBC 9, UA without infectious parameters but colic difficult to control.   Reproductive/Obstetrics                              Anesthesia Physical Anesthesia Plan  ASA: 3  Anesthesia Plan: General   Post-op Pain  Management: Tylenol PO (pre-op)* and Minimal or no pain anticipated   Induction: Intravenous  PONV Risk Score and Plan: 4 or greater and Ondansetron, Dexamethasone, Treatment may vary due to age or medical condition and Propofol infusion  Airway Management Planned: LMA  Additional Equipment:   Intra-op Plan:   Post-operative Plan: Extubation in OR  Informed Consent: I have reviewed the patients History and Physical, chart, labs and discussed the procedure including the risks, benefits and alternatives for the proposed anesthesia with the patient or authorized representative who has indicated his/her understanding and acceptance.     Dental advisory given  Plan Discussed with: CRNA  Anesthesia Plan Comments:          Anesthesia Quick Evaluation

## 2022-11-15 NOTE — Anesthesia Postprocedure Evaluation (Signed)
Anesthesia Post Note  Patient: Gail Henry  Procedure(s) Performed: CYSTOSCOPY WITH RETROGRADE PYELOGRAM, URETEROSCOPY AND STENT PLACEMENT, BASKET STONE REMOVAL (Left) HOLMIUM LASER APPLICATION (Left)     Patient location during evaluation: PACU Anesthesia Type: General Level of consciousness: sedated and patient cooperative Pain management: pain level controlled Vital Signs Assessment: post-procedure vital signs reviewed and stable Respiratory status: spontaneous breathing Cardiovascular status: stable Anesthetic complications: no   No notable events documented.  Last Vitals:  Vitals:   11/15/22 1815 11/15/22 1830  BP: (!) 157/99 (!) 152/88  Pulse: 70 73  Resp:    Temp:    SpO2: 98% 100%    Last Pain:  Vitals:   11/15/22 1800  TempSrc:   PainSc: 8                  Wyoma Genson [employer]

## 2022-11-15 NOTE — Brief Op Note (Signed)
11/15/2022  3:11 PM  PATIENT:  Gail Henry  77 y.o. female  PRE-OPERATIVE DIAGNOSIS:  LEFT RENAL  AND URETERAL STONES  POST-OPERATIVE DIAGNOSIS:  left ureteral stone  PROCEDURE:  Procedure(s) with comments: CYSTOSCOPY WITH RETROGRADE PYELOGRAM, URETEROSCOPY AND STENT PLACEMENT, BASKET STONE REMOVAL (Left) - 75 MINS HOLMIUM LASER APPLICATION (Left)  SURGEON:  Surgeon(s) and Role:    * Kimberlea Schlag, Delbert Phenix., MD - Primary  PHYSICIAN ASSISTANT:   ASSISTANTS: none   ANESTHESIA:   general  EBL:  minimal   BLOOD ADMINISTERED:none  DRAINS: none   LOCAL MEDICATIONS USED:  NONE  SPECIMEN:  Source of Specimen:  left renal / ureteral stone fragments  DISPOSITION OF SPECIMEN:   [facility] for compositional analysis  COUNTS:  YES  TOURNIQUET:  * No tourniquets in log *  DICTATION: .Other Dictation: Dictation Number [id number]  PLAN OF CARE: Discharge to home after PACU  PATIENT DISPOSITION:  PACU - hemodynamically stable.   Delay start of Pharmacological VTE agent (>24hrs) due to surgical blood loss or risk of bleeding: not applicable

## 2022-11-15 NOTE — Op Note (Signed)
NAMEKRIS, BURD MEDICAL RECORD NO: [id number] ACCOUNT NO: [id number] DATE OF BIRTH: 1945/12/17 FACILITY: [facility] LOCATION: [facility] PHYSICIAN: Sebastian Ache, MD  Operative Report   DATE OF PROCEDURE: 11/15/2022  PREOPERATIVE DIAGNOSIS:  Left ureteral and renal stones.  PROCEDURE PERFORMED:   1.  Cystoscopy, left retrograde pyelogram interpretation. 2.  Exchange of left ureteral stent. 3.  Left ureteroscopy with laser lithotripsy.  ESTIMATED BLOOD LOSS:  Nil.  COMPLICATIONS:  None.  SPECIMEN:  Left renal and ureteral stone fragments for composite analysis.  FINDINGS:   1.  Retrograde positioning of prior ureteral stone into the lower pole calix. 2.  Complete resolution of all accessible stone fragments larger than one-third mm following laser lithotripsy and basket extraction. 3.  Additional left lower mid papillary tip calcification. 4.  Successful replacement of left ureteral stent, proximal end in the renal pelvis, distal end in the urinary bladder, with tether.  INDICATIONS:  The patient is a pleasant 77 year old with history of Crohn's disease.  She was found on workup of severe colicky flank pain to have left proximal ureteral stone.  By CT imaging earlier this month her colic was quite difficult to control  she underwent stenting in the urgent setting as temporizing measure and now presents for definitive management with ureteroscopy today with goal of stone free.  Informed consent was obtained and placed in medical record.  PROCEDURE IN DETAIL:  The patient being identified and verified.  Procedure being left ureteroscopic stone manipulation was confirmed.  Procedure timeout was performed.  Intravenous antibiotics administered.  General LMA anesthesia induced.  The patient  was placed into a low lithotomy position.  Sterile field was created, prepped and draped the patient's vagina, introitus, and proximal thighs using iodine.  Cystourethroscopy was performed using  21-French rigid cystoscope with offset lens.  Inspection of  urinary bladder revealed no diverticula, calcifications or papillary lesions.  Distal end of the stent was seen in situ.  It was grasped, brought to the level of the urethral meatus and a 0.038 ZIPwire was then advanced to the level of the upper pole.   Stent was exchanged for open-ended catheter and left retrograde pyelogram was obtained.  Left retrograde pyelogram demonstrated a single left ureter, single system left kidney.  No filling defects or narrowing noted.  There was a dominant calcification noted in the lower pole likely consistent with retrograde positioning of prior ureteral  stone.  The ZIPwire was once again advanced and set aside as a safety wire.  An 8-French feeding tube placed in the urinary bladder for pressure release and semirigid ureteroscopy performed of the distal orifice left ureter alongside a separate sensor  working wire.  No mucosal abnormalities were found.  The semirigid scope was exchanged for a short length ureteral access sheath to the level of proximal ureter using continuous fluoroscopic guidance and flexible digital ureteroscopy was performed of the  proximal left ureter and systematic inspection left kidney including all calices x3.  There was a dominant calcification in lower pole calix as anticipated.  This was corresponding to retrograde positioning of prior ureteral stone.  This was much too  large for simple basketing.  It was retrograde positioned into upper pole calix to allow for less acute angulation using the Escape basket and holmium laser energy applied stone using 0.2 joules and 20 Hz using dusting technique, approximately 50% of the  stone was dusted, 50% fragmented. The fragments being amenable to simple basketing.  There was additional calcifications noted in the  lower mid calix.  This was papillary tip type.  This was ablated similarly with holmium laser energy.  Following this,  complete  resolution of all accessible stone fragments larger than one-third mm access sheath was removed under continuous vision, no significant mucosal abnormalities were found.  Given access sheath usage, it was felt that brief interval stenting with  tethered stent would be most prudent.  As such, a new 5 x 26 Polaris type stent was replaced over the safety wire using fluoroscopic guidance.  Good proximal and distal planes were noted.  Tether was left in place, trimmed to length, tucked per vagina  and the procedure was terminated.  The patient tolerated procedure well, no immediate perioperative complications.  The patient was taken to postanesthesia care unit in stable condition.  Plan for discharge home.   PUS D: 11/15/2022 3:16:08 pm T: 11/15/2022 5:08:00 pm  JOB: [id number]/ [id number]

## 2022-11-15 NOTE — H&P (Signed)
Gail Henry is an 77 y.o. female.    Chief Complaint: Pre-Op LEFT Ureteroscopic Stone Manipulation  HPI:   1 - Left Ureteral / Renal Stones - 1cm left prox ureteral stone with moderate hydro by ER CT at [facility] 10/30/22. Cr 1.1, WBC 9, UA without infectious parameters but colic difficult to control. Temporized with ureteral stent on 10/31/22.    PMH sig for Chron's s/p surgeyr x 2. Her PCP is Almond Lint MD in [place].    Today "Gail Henry" is seen to proceed with LEFT ureteroscopy for stone. No interval fevers. Most recent UA without infectious parameters.   Past Medical History:  Diagnosis Date   Abdominal pain 10/18/2010   Anemia    Arthritis    Constipation 10/18/2010   Crohn's disease 10/18/2010   SMALL BOWELS   Diarrhea 10/18/2010   Diverticulosis    Heart murmur    ECHO 2022   History of kidney stones    Kidney stone on left side     Past Surgical History:  Procedure Laterality Date   BIOPSY  06/02/2020   Procedure: BIOPSY;  Surgeon: Malissa Hippo, MD;  Location: [facility];  Service: Endoscopy;;   BREAST SURGERY Right    [id number],[id number] Lumpectomy   COLONOSCOPY  10/16/2001   COLONOSCOPY  03/15/2012   Procedure: COLONOSCOPY;  Surgeon: Malissa Hippo, MD;  Location: [facility];  Service: Endoscopy;  Laterality: N/A;  730   COLONOSCOPY WITH PROPOFOL N/A 06/02/2020   Procedure: COLONOSCOPY WITH PROPOFOL;  Surgeon: Malissa Hippo, MD;  Location: [facility];  Service: Endoscopy;  Laterality: N/A;  730   COLONOSCOPY WITH PROPOFOL N/A 07/21/2022   Procedure: COLONOSCOPY WITH PROPOFOL;  Surgeon: Dolores Frame, MD;  Location: [facility];  Service: Gastroenterology;  Laterality: N/A;  9:45am, asa 1-2   CYSTOSCOPY W/ URETERAL STENT PLACEMENT Left 10/31/2022   Procedure: CYSTOSCOPY WITH RETROGRADE PYELOGRAM/URETERAL STENT PLACEMENT;  Surgeon: Sebastian Ache, MD;  Location: [facility];  Service: Urology;  Laterality: Left;   DILATION AND CURETTAGE OF  UTERUS  09/1999   Polyp resection   ESOPHAGEAL DILATION N/A 06/02/2020   Procedure: ESOPHAGEAL DILATION;  Surgeon: Malissa Hippo, MD;  [facility];  Service: Endoscopy;  Laterality: N/A;   ESOPHAGOGASTRODUODENOSCOPY (EGD) WITH PROPOFOL N/A 06/02/2020   Procedure: ESOPHAGOGASTRODUODENOSCOPY (EGD) WITH PROPOFOL;  Surgeon: Malissa Hippo, MD;  [facility];  Service: Endoscopy;  Laterality: N/A;   HEMOSTASIS CLIP PLACEMENT  07/21/2022   Procedure: HEMOSTASIS CLIP PLACEMENT;  Surgeon: Dolores Frame, MD;  [facility];  Service: Gastroenterology;;   LAPAROSCOPIC CHOLECYSTECTOMY  12/17/1996   DEMASON   POLYPECTOMY  07/21/2022   Procedure: POLYPECTOMY INTESTINAL;  Surgeon: Dolores Frame, MD;  [facility]: [facility];  Service: Gastroenterology;;    Family History  Problem Relation Age of Onset   Heart disease Father    Healthy Sister    Healthy Brother    Healthy Sister    Healthy Daughter    Healthy Son    Social History:  reports that she has never smoked. She has never been exposed to tobacco smoke. She has never used smokeless tobacco. She reports that she does not drink alcohol and does not use drugs.  Allergies:  Allergies  Allergen Reactions   Penicillins Hives   Dexilant [Dexlansoprazole] Other (See Comments)    Muscle pain   Nexium [Esomeprazole Magnesium] Nausea Only    No medications prior to admission.  No results found for this or any previous visit (from the past 48 hour(s)). No results found.  Review of Systems  Constitutional:  Negative for chills and fever.  Genitourinary:  Positive for urgency.  All other systems reviewed and are negative.   There were no vitals taken for this visit. Physical Exam Vitals reviewed.  HENT:     Head: Normocephalic.  Eyes:     Pupils: Pupils are equal, round, and reactive to light.  Cardiovascular:     Rate and Rhythm: Normal rate.  Pulmonary:     Effort:  Pulmonary effort is normal.  Abdominal:     General: Abdomen is flat.  Genitourinary:    Comments: No CVAT at present Musculoskeletal:        General: Normal range of motion.  Neurological:     General: No focal deficit present.  Psychiatric:        Mood and Affect: Mood normal.      Assessment/Plan  Proceed as planned with LEFT ureteroscopic stone manipulation. Risks, benefits, alternatives, expectd peri-op course discussed previously and reiterated today.   Loletta Parish., MD 11/15/2022, 8:29 AM

## 2022-11-15 NOTE — Transfer of Care (Signed)
Immediate Anesthesia Transfer of Care Note  Patient: Gail Henry  Procedure(s) Performed: CYSTOSCOPY WITH RETROGRADE PYELOGRAM, URETEROSCOPY AND STENT PLACEMENT, BASKET STONE REMOVAL (Left) HOLMIUM LASER APPLICATION (Left)  Patient Location: PACU  Anesthesia Type:General  Level of Consciousness: awake, alert , oriented, and patient cooperative  Airway & Oxygen Therapy: Patient Spontanous Breathing and Patient connected to nasal cannula oxygen  Post-op Assessment: Report given to RN, Post -op Vital signs reviewed and stable, and Patient moving all extremities  Post vital signs: Reviewed and stable  Last Vitals:  Vitals Value Taken Time  BP 151/83 11/15/22 1531  Temp    Pulse 73 11/15/22 1533  Resp 13 11/15/22 1533  SpO2 100 % 11/15/22 1533  Vitals shown include unvalidated device data.  Last Pain:  Vitals:   11/15/22 1254  TempSrc:   PainSc: 0-No pain         Complications: No notable events documented.

## 2022-11-15 NOTE — Discharge Instructions (Addendum)
1 - You may have urinary urgency (bladder spasms) and bloody urine on / off with stent in place. This is normal.  2 - Remove tethered stent on Friday morning at home by pulling on string, then blue-white plastic tubing, and discarding. Office is open Friday if any issues arise.   3 - Call MD or go to ER for fever >102, severe pain / nausea / vomiting not relieved by medications, or acute change in medical status  

## 2022-11-16 ENCOUNTER — Encounter (HOSPITAL_COMMUNITY): Payer: Self-pay | Admitting: Urology

## 2022-11-20 ENCOUNTER — Telehealth (INDEPENDENT_AMBULATORY_CARE_PROVIDER_SITE_OTHER): Payer: Self-pay | Admitting: *Deleted

## 2022-11-20 NOTE — Telephone Encounter (Signed)
It is ok to do infusion

## 2022-11-20 NOTE — Telephone Encounter (Signed)
Patient notified ok to do infusion.

## 2022-11-20 NOTE — Telephone Encounter (Signed)
Patient had procedure to remove kidney stones on April 17th. She is feeling ok. No pain, no fever and due for her entyvio infusion this Thursday and wanted to make sure it is ok for her to have infusion this Thursday.  [phone]

## 2022-11-23 ENCOUNTER — Encounter (HOSPITAL_COMMUNITY)
Admission: RE | Admit: 2022-11-23 | Discharge: 2022-11-23 | Disposition: A | Discharge status: Home / Self Care | Payer: Medicare Other | Source: Ambulatory Visit | Attending: Gastroenterology | Admitting: Gastroenterology

## 2022-11-23 VITALS — BP 108/65 | HR 64 | Temp 98.4°F | Resp 16

## 2022-11-23 DIAGNOSIS — K50019 Crohn's disease of small intestine with unspecified complications: Secondary | ICD-10-CM | POA: Diagnosis not present

## 2022-11-23 MED ORDER — VEDOLIZUMAB 300 MG IV SOLR
300.0000 mg | INTRAVENOUS | Status: DC
  Administered 2022-11-23: 300 mg via INTRAVENOUS
  Filled 2022-11-23: qty 5

## 2022-11-23 NOTE — Progress Notes (Signed)
Diagnosis: Crohn's Disease  Provider:  Dolores Frame MD  Procedure: IV Infusion  IV Type: Peripheral, IV Location: L wrist  Entyvio (Vedolizumab), Dose: 300 mg  Infusion Start Time: 1020  Infusion Stop Time: 1057  Post Infusion IV Care: Patient declined observation and Peripheral IV Discontinued  Discharge: Condition: Stable, Destination: Home . AVS Provided  Performed by:  Wyvonne Lenz, RN

## 2022-11-23 NOTE — Progress Notes (Signed)
Diagnosis: Crohn's Disease  Provider:  Dolores Frame MD  Procedure: IV Infusion  IV Type: Peripheral, IV Location: L Hand  Entyvio (Vedolizumab), Dose: 300 mg  Infusion Start Time: 1020  Infusion Stop Time: 1057  Post Infusion IV Care: Peripheral IV Discontinued  Discharge: Condition: Good, Destination: Home . AVS Provided  Performed by:  Marlow Baars [place], RN

## 2022-12-18 ENCOUNTER — Encounter (INDEPENDENT_AMBULATORY_CARE_PROVIDER_SITE_OTHER): Payer: Self-pay | Admitting: Gastroenterology

## 2022-12-18 ENCOUNTER — Ambulatory Visit (INDEPENDENT_AMBULATORY_CARE_PROVIDER_SITE_OTHER): Payer: Medicare Other | Admitting: Gastroenterology

## 2022-12-18 VITALS — BP 112/65 | HR 71 | Ht 66.0 in | Wt 171.5 lb

## 2022-12-18 DIAGNOSIS — K219 Gastro-esophageal reflux disease without esophagitis: Secondary | ICD-10-CM

## 2022-12-18 DIAGNOSIS — K5080 Crohn's disease of both small and large intestine without complications: Secondary | ICD-10-CM

## 2022-12-18 NOTE — Patient Instructions (Addendum)
Perform blood workup today before your next Entyvio dose Continue Entyvio every 8 weeks Continue rabeprazole 20 mg every day Depending on Entyvio levels, will consider performing repeat MRE

## 2022-12-18 NOTE — Progress Notes (Unsigned)
Gail Henry, M.D. Gastroenterology & Hepatology [facility] Gastroenterology [address] [place], [place] [address]  Primary Care Physician: Gail Chimera, Henry [address] [place] [address]  I will communicate my assessment and recommendations to the referring Henry via EMR.  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 female with a complex medical history of ileocolonic Crohn's disease status post colon resection x2 (once due to microperforation), GERD, who presents for follow up of Crohn's disease.  The patient was last seen on 06/19/2022. At that time, the patient was continued on omeprazole for GERD and Entyvio for Crohn's disease.  Colonoscopy was performed with findings described below.  Patient reports that she has felt more fatigued recently but denies any melena or hematochezia. Bms seem to be regular 1-2 times a day unless she eats lettuce. The patient denies having any nausea, vomiting, fever, chills, hematochezia, melena, hematemesis, abdominal distention, abdominal pain, jaundice, pruritus or weight loss.  Tries to avoid sour taste food to not have exacerbation of burning tongue syndrome, but overall her mouth does not bother her often.  Patient was begun on Entyvio in February 2023.  She received 3 doses for induction and first maintenance dose was on 12/22/2021. Last Entyvio dose was on 11/23/22.   She denies any heartburn or dysphagia. Takes rabeprazole 20 mg qday which she believes helps controlling her GERD, no dysphagia, odynophagia or heartbun   Previous medications: Humira (had blisters, fever and burning tongue), budesonide, mesalamine  Most recent labs from April 2024 showed AST of 33, ALT 25, albumin 3.3, hemoglobin 7.8,  platelet endoscopic ultrasound 81, potassium 3.2, otherwise normal electrolytes, creatinine 1.11, BUN 11, CBC with WBC 5.4, hemoglobin 11.3, MCV 93 and platelets 235.  Last flu shot:2023 Last pneumonia shot: 2019 Last evaluation by dermatology: 2023 Last zoster vaccine: 2020 COVID-19 shot:  did the first round and one booster  Last Colonoscopy: 07/21/2022 Presence of 5 small 3 to 4 mm ulcers consistent with Rutgeers i1.  There was presence of an anastomotic stenosis.  There was evidence of a prior end-to-side site ileocolonic anastomosis in the ascending colon with a stenosis measuring 1 cm x 0.5 cm.  8 mm polyp was removed from the anastomosis (inflammatory polyp), which was subsequently clipped.  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  Patient was considered to be in endoscopic remission.  Recommended repeat colonoscopy in 2 years due to longstanding history of ileocolonic Crohn's disease  Past Medical History: Past Medical History:  Diagnosis Date   Abdominal pain 10/18/2010   Anemia    Arthritis    Constipation 10/18/2010   Crohn's disease (HCC) 10/18/2010   SMALL BOWELS   Diarrhea 10/18/2010   Diverticulosis    Heart murmur    ECHO 2022   History of kidney stones    Kidney stone on left side     Past Surgical History: Past Surgical History:  Procedure Laterality Date   BIOPSY  06/02/2020   Procedure: BIOPSY;  Surgeon: Gail Hippo, Henry;  [facility];  Service: Endoscopy;;   BREAST SURGERY Right    [id number] Lumpectomy   COLONOSCOPY  10/16/2001  COLONOSCOPY  03/15/2012   Procedure: COLONOSCOPY;  Surgeon: Gail Hippo, Henry;  [facility];  Service: Endoscopy;  Laterality: N/A;  730   COLONOSCOPY WITH PROPOFOL N/A 06/02/2020   Procedure: COLONOSCOPY WITH  PROPOFOL;  Surgeon: Gail Hippo, Henry;  [facility];  Service: Endoscopy;  Laterality: N/A;  730   COLONOSCOPY WITH PROPOFOL N/A 07/21/2022   Procedure: COLONOSCOPY WITH PROPOFOL;  Surgeon: Gail Frame, Henry;  [facility];  Service: Gastroenterology;  Laterality: N/A;  9:45am, asa 1-2   CYSTOSCOPY W/ URETERAL STENT PLACEMENT Left 10/31/2022   Procedure: CYSTOSCOPY WITH RETROGRADE PYELOGRAM/URETERAL STENT PLACEMENT;  Surgeon: Gail Ache, Henry;  [facility];  Service: Urology;  Laterality: Left;   CYSTOSCOPY WITH RETROGRADE PYELOGRAM, URETEROSCOPY AND STENT PLACEMENT Left 11/15/2022   Procedure: CYSTOSCOPY WITH RETROGRADE PYELOGRAM, URETEROSCOPY AND STENT PLACEMENT, BASKET STONE REMOVAL;  Surgeon: Gail Parish., Henry;  [facility];  Service: Urology;  Laterality: Left;  75 MINS   DILATION AND CURETTAGE OF UTERUS  09/1999   Polyp resection   ESOPHAGEAL DILATION N/A 06/02/2020   Procedure: ESOPHAGEAL DILATION;  Surgeon: Gail Hippo, Henry;  [facility];  Service: Endoscopy;  Laterality: N/A;   ESOPHAGOGASTRODUODENOSCOPY (EGD) WITH PROPOFOL N/A 06/02/2020   Procedure: ESOPHAGOGASTRODUODENOSCOPY (EGD) WITH PROPOFOL;  Surgeon: Gail Hippo, Henry;  [facility];  Service: Endoscopy;  Laterality: N/A;   HEMOSTASIS CLIP PLACEMENT  07/21/2022   Procedure: HEMOSTASIS CLIP PLACEMENT;  Surgeon: Gail Frame, Henry;  [facility];  Service: Gastroenterology;;   HOLMIUM LASER APPLICATION Left 11/15/2022   Procedure: HOLMIUM LASER APPLICATION;  Surgeon: Gail Parish., Henry;  [facility];  Service: Urology;  Laterality: Left;   LAPAROSCOPIC CHOLECYSTECTOMY  12/17/1996   DEMASON   POLYPECTOMY  07/21/2022   Procedure: POLYPECTOMY INTESTINAL;  Surgeon: Gail Henry, Gail Henry;  [facility];  Service: Gastroenterology;;    Family History: Family History  Problem Relation Age of  Onset   Heart disease Father    Healthy Sister    Healthy Brother    Healthy Sister    Healthy Daughter    Healthy Son     Social History: Social History   Tobacco Use  Smoking Status Never   Passive exposure: Never  Smokeless Tobacco Never   Social History   Substance and Sexual Activity  Alcohol Use No   Alcohol/week: 0.0 standard drinks of alcohol   Social History   Substance and Sexual Activity  Drug Use No    Allergies: Allergies  Allergen Reactions   Penicillins Hives   Dexilant [Dexlansoprazole] Other (See Comments)    Muscle pain   Nexium [Esomeprazole Magnesium] Nausea Only    Medications: Current Outpatient Medications  Medication Sig Dispense Refill   acetaminophen (TYLENOL) 500 MG tablet Take 500 mg by mouth every 6 (six) hours as needed for moderate pain or headache.     Carboxymethylcellul-Glycerin (REFRESH RELIEVA OP) Place 1-2 drops into both eyes 3 (three) times daily as needed (dry/irritated eyes.).     Cholecalciferol (VITAMIN D3) 2000 UNITS TABS Take 2,000 Units by mouth in the morning.     docusate sodium (COLACE) 100 MG capsule Take 2 capsules (200 mg total) by mouth at bedtime. (Patient taking differently: Take 200 mg by mouth daily as needed (constipation.).)  0   Multiple Vitamins-Minerals (MULTIVITAMIN WITH MINERALS) tablet Take 2 tablets by mouth in the morning. forbia  nystatin-triamcinolone (MYCOLOG II) cream Apply 1 Application topically 2 (two) times daily as needed (skin irritation (skin folds)).     OVER THE COUNTER MEDICATION Vit b complex with c one daily     RABEprazole (ACIPHEX) 20 MG tablet TAKE 1 TABLET BY MOUTH DAILY BEFORE breakfast 90 tablet 0   sodium chloride (OCEAN) 0.65 % SOLN nasal spray Place 1 spray into both nostrils in the morning and at bedtime.     vedolizumab (ENTYVIO) 300 MG injection Inject 300 mg into the vein every 8 (eight) weeks.     No current facility-administered medications for this visit.     Review of Systems: GENERAL: negative for malaise, night sweats HEENT: No changes in hearing or vision, no nose bleeds or other nasal problems. NECK: Negative for lumps, goiter, pain and significant neck swelling RESPIRATORY: Negative for cough, wheezing CARDIOVASCULAR: Negative for chest pain, leg swelling, palpitations, orthopnea GI: SEE HPI MUSCULOSKELETAL: Negative for joint pain or swelling, back pain, and muscle pain. SKIN: Negative for lesions, rash PSYCH: Negative for sleep disturbance, mood disorder and recent psychosocial stressors. HEMATOLOGY Negative for prolonged bleeding, bruising easily, and swollen nodes. ENDOCRINE: Negative for cold or heat intolerance, polyuria, polydipsia and goiter. NEURO: negative for tremor, gait imbalance, syncope and seizures. The remainder of the review of systems is noncontributory.   Physical Exam: BP 112/65 (BP Location: Left Arm, Patient Position: Sitting, Cuff Size: Large)   Pulse 71   Ht 5\' 6"  (1.676 m)   Wt 171 lb 8 oz (77.8 kg)   BMI 27.68 kg/m  GENERAL: The patient is AO x3, in no acute distress. HEENT: Head is normocephalic and atraumatic. EOMI are intact. Mouth is well hydrated and without lesions. NECK: Supple. No masses LUNGS: Clear to auscultation. No presence of rhonchi/wheezing/rales. Adequate chest expansion HEART: RRR, normal s1 and s2. ABDOMEN: Soft, nontender, no guarding, no peritoneal signs, and nondistended. BS +. No masses. EXTREMITIES: Without any cyanosis, clubbing, rash, lesions or edema. NEUROLOGIC: AOx3, no focal motor deficit. SKIN: no jaundice, no rashes  Imaging/Labs: as above  I personally reviewed and interpreted the available labs, imaging and endoscopic files.  Impression and Plan: Gail Henry is a 77 y.o. female with a complex medical history of ileocolonic Crohn's disease status post colon resection x2 (once due to microperforation), GERD, who presents for follow up of Crohn's disease.   The patient has presented relatively mild symptoms.  Most recent endoscopic evaluation showed improvement of her inflammatory findings, especially in the colon.  However evaluation of the small bowel was limited due to the presence of stricture.  She has been some improvement with the use of Entyvio dosage.  We will obtain Entyvio levels prior to her next dose to determine if she has been optimized in terms of drug levels in her body.  Infantino levels are therapeutic, will consider repeating an MRI.  Finally, her GERD is well-controlled with the current PPI, we will continue rabeprazole 20 mg every day.  - Perform blood workup today before your next Entyvio dose -Continue Entyvio every 8 weeks -Continue rabeprazole 20 mg every day -Depending on Entyvio levels, will consider performing repeat MRE  All questions were answered.      Gail Blazing, Henry Gastroenterology and Hepatology [facility] Gastroenterology

## 2023-01-18 ENCOUNTER — Encounter (HOSPITAL_COMMUNITY)
Admission: RE | Admit: 2023-01-18 | Discharge: 2023-01-18 | Disposition: A | Discharge status: Home / Self Care | Payer: Medicare Other | Source: Ambulatory Visit | Attending: Gastroenterology | Admitting: Gastroenterology

## 2023-01-18 VITALS — BP 131/76 | HR 64 | Temp 98.1°F | Resp 18

## 2023-01-18 DIAGNOSIS — K50019 Crohn's disease of small intestine with unspecified complications: Secondary | ICD-10-CM | POA: Diagnosis present

## 2023-01-18 DIAGNOSIS — Z79620 Long term (current) use of immunosuppressive biologic: Secondary | ICD-10-CM | POA: Insufficient documentation

## 2023-01-18 MED ORDER — VEDOLIZUMAB 300 MG IV SOLR
300.0000 mg | INTRAVENOUS | Status: DC
  Administered 2023-01-18: 300 mg via INTRAVENOUS
  Filled 2023-01-18: qty 5

## 2023-01-18 NOTE — Progress Notes (Signed)
Diagnosis: Crohn's Disease  Provider:  Dolores Frame MD  Procedure: IV Infusion  IV Type: Peripheral, IV Location: L wrist  Entyvio (Vedolizumab), Dose: 300 mg  Infusion Start Time: 0949  Infusion Stop Time: 1020  Post Infusion IV Care: Patient declined observation and Peripheral IV Discontinued  Discharge: Condition: Stable, Destination: Home . AVS Provided  Performed by:  Marin Shutter, RN

## 2023-01-25 ENCOUNTER — Other Ambulatory Visit (INDEPENDENT_AMBULATORY_CARE_PROVIDER_SITE_OTHER): Payer: Self-pay | Admitting: Gastroenterology

## 2023-01-25 DIAGNOSIS — K5000 Crohn's disease of small intestine without complications: Secondary | ICD-10-CM

## 2023-01-25 LAB — VEDOLIZUMAB AND ANTI-VEDO AB: Vedolizumab: 21 ug/mL

## 2023-01-27 LAB — VEDOLIZUMAB AND ANTI-VEDO AB: Anti-Vedolizumab Antibody: <25 ng/mL

## 2023-02-20 ENCOUNTER — Other Ambulatory Visit (HOSPITAL_COMMUNITY): Payer: Medicare Other

## 2023-02-21 ENCOUNTER — Other Ambulatory Visit (INDEPENDENT_AMBULATORY_CARE_PROVIDER_SITE_OTHER): Payer: Self-pay | Admitting: Gastroenterology

## 2023-02-21 DIAGNOSIS — K5000 Crohn's disease of small intestine without complications: Secondary | ICD-10-CM

## 2023-02-25 ENCOUNTER — Other Ambulatory Visit (INDEPENDENT_AMBULATORY_CARE_PROVIDER_SITE_OTHER): Payer: Self-pay | Admitting: Gastroenterology

## 2023-02-26 ENCOUNTER — Ambulatory Visit (HOSPITAL_COMMUNITY): Payer: Medicare Other | Attending: Gastroenterology

## 2023-02-26 ENCOUNTER — Ambulatory Visit (HOSPITAL_COMMUNITY): Admission: RE | Admit: 2023-02-26 | Payer: Medicare Other | Source: Ambulatory Visit

## 2023-03-15 ENCOUNTER — Encounter (HOSPITAL_COMMUNITY): Payer: Medicare Other | Attending: Gastroenterology

## 2023-03-15 ENCOUNTER — Encounter (HOSPITAL_COMMUNITY): Admission: RE | Admit: 2023-03-15 | Payer: Medicare Other | Source: Ambulatory Visit

## 2023-03-15 VITALS — BP 136/77 | HR 66 | Temp 98.6°F | Resp 20

## 2023-03-15 DIAGNOSIS — K5080 Crohn's disease of both small and large intestine without complications: Secondary | ICD-10-CM | POA: Insufficient documentation

## 2023-03-15 DIAGNOSIS — K50019 Crohn's disease of small intestine with unspecified complications: Secondary | ICD-10-CM | POA: Diagnosis present

## 2023-03-15 DIAGNOSIS — Z79620 Long term (current) use of immunosuppressive biologic: Secondary | ICD-10-CM | POA: Diagnosis not present

## 2023-03-15 MED ORDER — VEDOLIZUMAB 300 MG IV SOLR
300.0000 mg | Freq: Once | INTRAVENOUS | Status: AC
  Administered 2023-03-15: 300 mg via INTRAVENOUS
  Filled 2023-03-15: qty 5

## 2023-03-15 NOTE — Progress Notes (Signed)
Diagnosis: Crohn's Disease  Provider:  [name] MD  Procedure: IV Infusion  IV Type: Peripheral, IV Location: L Hand  Entyvio (Vedolizumab), Dose: 300 mg  Infusion Start Time: 0952  Infusion Stop Time: 1027  Post Infusion IV Care: Patient declined observation and Peripheral IV Discontinued  Discharge: Condition: Good, Destination: Home . AVS Provided  Performed by:  Marlow Baars [place], RN

## 2023-03-22 ENCOUNTER — Ambulatory Visit (HOSPITAL_COMMUNITY)
Admission: RE | Admit: 2023-03-22 | Discharge: 2023-03-22 | Disposition: A | Discharge status: Home / Self Care | Payer: Medicare Other | Source: Ambulatory Visit | Attending: Gastroenterology | Admitting: Gastroenterology

## 2023-03-22 ENCOUNTER — Encounter (HOSPITAL_COMMUNITY): Payer: Self-pay

## 2023-03-22 DIAGNOSIS — K5000 Crohn's disease of small intestine without complications: Secondary | ICD-10-CM | POA: Insufficient documentation

## 2023-03-22 MED ORDER — GLUCAGON HCL RDNA (DIAGNOSTIC) 1 MG IJ SOLR
1.0000 mg | Freq: Once | INTRAMUSCULAR | Status: AC
  Administered 2023-03-22: 1 mg via INTRAVENOUS

## 2023-03-22 MED ORDER — GADOBUTROL 1 MMOL/ML IV SOLN
7.0000 mL | Freq: Once | INTRAVENOUS | Status: AC | PRN
  Administered 2023-03-22: 7 mL via INTRAVENOUS

## 2023-03-22 MED ORDER — GLUCAGON HCL RDNA (DIAGNOSTIC) 1 MG IJ SOLR
INTRAMUSCULAR | Status: AC
  Filled 2023-03-22: qty 1

## 2023-03-27 ENCOUNTER — Ambulatory Visit (INDEPENDENT_AMBULATORY_CARE_PROVIDER_SITE_OTHER): Payer: Medicare Other | Admitting: Gastroenterology

## 2023-03-27 ENCOUNTER — Encounter (INDEPENDENT_AMBULATORY_CARE_PROVIDER_SITE_OTHER): Payer: Self-pay | Admitting: Gastroenterology

## 2023-03-27 VITALS — BP 133/81 | HR 75 | Temp 97.8°F | Ht 66.5 in | Wt 174.4 lb

## 2023-03-27 DIAGNOSIS — K5000 Crohn's disease of small intestine without complications: Secondary | ICD-10-CM

## 2023-03-27 NOTE — Patient Instructions (Signed)
As discussed, I would recommend we start either skyrizi or stelara in place of your entyvio. Please research both of these and let me know which you think you would prefer In the meantime, I will update basic labs  Follow up 3 months

## 2023-03-27 NOTE — Progress Notes (Signed)
Referring Provider: Richardean Chimera, MD Primary Care Physician:  Richardean Chimera, MD Primary GI Physician: Dr. Levon Hedger   Chief Complaint  Patient presents with   Consult    Patient here today to discuss changing medications. Per Dr. Levon Hedger, presence of persistent short segment inflammation of 3 cm in length in the distal small bowel without presence of severe stricturing.  As she has adequate levels of Entyvio, I suspect that the inflammation corresponds to treatment failure as she has been on this medication for more than 1 year.  I advised to call back to discuss treatment options and possibly schedule an earlier appointment soon.   HPI:   Gail Henry is a 77 y.o. female with past medical history of complex medical history of ileocolonic Crohn's disease status post colon resection x2 (once due to microperforation), GERD   Patient presenting today to discuss other Crohn's therapy  Last seen may 2024, at that time feeling more fatigued. Having 1-2 BMs per day. No bleeding.   Started [facility] Feb 2023, Previous medications: Humira (had blisters, fever and burning tongue), budesonide, mesalamine   Recommended to have entyvio levels checked prior to next dose, MRE pending entyvio levels, continue rabeprazole 20mg  daily, Entyvio Q8w  Labs in June showed adequate levels of Entyvio, she was scheduled for MRE which showed presence of persistent short segment inflammation of 3 cm in length in the distal small bowel without presence of severe stricturing   Present:  Patient states she is feeling pretty good. She denies abdominal pain. States 1-3 BMs per day. Stools are typically are softer to looser, occasionally will have a more solid stool. She notes she went on vacation recently and had some watery stools after but this has cleared up. Denies rectal bleeding.  No nausea or vomiting.   She is due for [facility] again October 15th.   Last Colonoscopy:06/2022 Presence of 5 small 3 to 4  mm ulcers consistent with Rutgeers i1.  There was presence of an anastomotic stenosis.  There was evidence of a prior end-to-side site ileocolonic anastomosis in the ascending colon with a stenosis measuring 1 cm x 0.5 cm.  8 mm polyp was removed from the anastomosis (inflammatory polyp), which was subsequently clipped.  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  Last Endoscopy:  Recommendations:    Past Medical History:  Diagnosis Date   Abdominal pain 10/18/2010   Anemia    Arthritis    Constipation 10/18/2010   Crohn's disease (HCC) 10/18/2010   SMALL BOWELS   Diarrhea 10/18/2010   Diverticulosis    Heart murmur    ECHO 2022   History of kidney stones    Kidney stone on left side     Past Surgical History:  Procedure Laterality Date   BIOPSY  06/02/2020   Procedure: BIOPSY;  Surgeon: Malissa Hippo, MD;  [facility];  Service: Endoscopy;;   BREAST SURGERY Right    [id number] Lumpectomy   COLONOSCOPY  10/16/2001   COLONOSCOPY  03/15/2012   Procedure: COLONOSCOPY;  Surgeon: Malissa Hippo, MD;  [facility];  Service: Endoscopy;  Laterality: N/A;  730   COLONOSCOPY WITH PROPOFOL N/A 06/02/2020   Procedure: COLONOSCOPY WITH PROPOFOL;  Surgeon: Malissa Hippo, MD;  [facility];  Service: Endoscopy;  Laterality: N/A;  730   COLONOSCOPY WITH PROPOFOL N/A 07/21/2022   Procedure: COLONOSCOPY WITH PROPOFOL;  Surgeon: Dolores Frame, MD;  [facility];  Service: Gastroenterology;  Laterality: N/A;  9:45am, asa 1-2   CYSTOSCOPY W/ URETERAL STENT PLACEMENT Left 10/31/2022   Procedure: CYSTOSCOPY WITH RETROGRADE PYELOGRAM/URETERAL STENT PLACEMENT;  Surgeon: Sebastian Ache, MD;  [facility];  Service: Urology;  Laterality: Left;   CYSTOSCOPY WITH  RETROGRADE PYELOGRAM, URETEROSCOPY AND STENT PLACEMENT Left 11/15/2022   Procedure: CYSTOSCOPY WITH RETROGRADE PYELOGRAM, URETEROSCOPY AND STENT PLACEMENT, BASKET STONE REMOVAL;  Surgeon: Loletta Parish., MD;  [facility];  Service: Urology;  Laterality: Left;  75 MINS   DILATION AND CURETTAGE OF UTERUS  09/1999   Polyp resection   ESOPHAGEAL DILATION N/A 06/02/2020   Procedure: ESOPHAGEAL DILATION;  Surgeon: Malissa Hippo, MD;  [facility];  Service: Endoscopy;  Laterality: N/A;   ESOPHAGOGASTRODUODENOSCOPY (EGD) WITH PROPOFOL N/A 06/02/2020   Procedure: ESOPHAGOGASTRODUODENOSCOPY (EGD) WITH PROPOFOL;  Surgeon: Malissa Hippo, MD;  [facility];  Service: Endoscopy;  Laterality: N/A;   HEMOSTASIS CLIP PLACEMENT  07/21/2022   Procedure: HEMOSTASIS CLIP PLACEMENT;  Surgeon: Dolores Frame, MD;  [facility];  Service: Gastroenterology;;   HOLMIUM LASER APPLICATION Left 11/15/2022   Procedure: HOLMIUM LASER APPLICATION;  Surgeon: Loletta Parish., MD;  [facility];  Service: Urology;  Laterality: Left;   LAPAROSCOPIC CHOLECYSTECTOMY  12/17/1996   DEMASON   POLYPECTOMY  07/21/2022   Procedure: POLYPECTOMY INTESTINAL;  Surgeon: Marguerita Merles, Reuel Boom, MD;  [facility];  Service: Gastroenterology;;    Current Outpatient Medications  Medication Sig Dispense Refill   acetaminophen (TYLENOL) 500 MG tablet Take 500 mg by mouth every 6 (six) hours as needed for moderate pain or headache.     Carboxymethylcellul-Glycerin (REFRESH RELIEVA OP) Place 1-2 drops into both eyes 3 (three) times daily as needed (dry/irritated eyes.).     Cholecalciferol (VITAMIN D3) 2000 UNITS TABS Take 2,000 Units by mouth in the morning.     docusate sodium (COLACE) 100 MG capsule Take 2 capsules (200 mg total) by mouth at bedtime. (Patient taking differently: Take 200 mg by mouth daily as needed (constipation.).)  0   Multiple  Vitamins-Minerals (MULTIVITAMIN WITH MINERALS) tablet Take 2 tablets by mouth in the morning. forbia     nystatin-triamcinolone (MYCOLOG II) cream Apply 1 Application topically 2 (two) times daily as needed (skin irritation (skin folds)).     OVER THE COUNTER MEDICATION Vit b complex with c one daily     RABEprazole (ACIPHEX) 20 MG tablet TAKE 1 TABLET BY MOUTH DAILY BEFORE breakfast 90 tablet 2   sodium chloride (OCEAN) 0.65 % SOLN nasal spray Place 1 spray into both nostrils in the morning and at bedtime.     vedolizumab (ENTYVIO) 300 MG injection Inject 300 mg into the vein every 8 (eight) weeks.     No current facility-administered medications for this visit.    Allergies as of 03/27/2023 - Review Complete 03/27/2023  Allergen Reaction Noted   Penicillins Hives 02/27/2011   Dexilant [dexlansoprazole] Other (See Comments) 06/30/2018   Nexium [esomeprazole magnesium] Nausea Only 06/30/2018    Family History  Problem Relation Age of Onset   Heart disease Father    Healthy Sister    Healthy Brother    Healthy Sister    Healthy Daughter    Healthy Son     Social History   Socioeconomic History  Marital status: Widowed    Spouse name: Not on file   Number of children: Not on file   Years of education: Not on file   Highest education level: Not on file  Occupational History   Not on file  Tobacco Use   Smoking status: Never    Passive exposure: Never   Smokeless tobacco: Never  Vaping Use   Vaping status: Never Used  Substance and Sexual Activity   Alcohol use: No    Alcohol/week: 0.0 standard drinks of alcohol   Drug use: No   Sexual activity: Not Currently  Other Topics Concern   Not on file  Social History Narrative   Not on file   Social Determinants of Health   Financial Resource Strain: Not on file  Food Insecurity: No Food Insecurity (10/31/2022)   Hunger Vital Sign    Worried About Running Out of Food in the Last Year: Never true    Ran Out of Food in  the Last Year: Never true  Transportation Needs: No Transportation Needs (10/31/2022)   PRAPARE - Administrator, Civil Service (Medical): No    Lack of Transportation (Non-Medical): No  Physical Activity: Not on file  Stress: Not on file  Social Connections: Not on file    Review of systems General: negative for malaise, night sweats, fever, chills, weight loss Neck: Negative for lumps, goiter, pain and significant neck swelling Resp: Negative for cough, wheezing, dyspnea at rest CV: Negative for chest pain, leg swelling, palpitations, orthopnea GI: denies melena, hematochezia, nausea, vomiting, constipation, dysphagia, odyonophagia, early satiety or unintentional weight loss. +looser stools  MSK: Negative for joint pain or swelling, back pain, and muscle pain. Derm: Negative for itching or rash Psych: Denies depression, anxiety, memory loss, confusion. No homicidal or suicidal ideation.  Heme: Negative for prolonged bleeding, bruising easily, and swollen nodes. Endocrine: Negative for cold or heat intolerance, polyuria, polydipsia and goiter. Neuro: negative for tremor, gait imbalance, syncope and seizures. The remainder of the review of systems is noncontributory.  Physical Exam: BP 133/81 (BP Location: Left Arm, Patient Position: Sitting, Cuff Size: Large)   Pulse 75   Temp 97.8 F (36.6 C) (Temporal)   Ht 5' 6.5" (1.689 m)   Wt 174 lb 6.4 oz (79.1 kg)   BMI 27.73 kg/m  General:   Alert and oriented. No distress noted. Pleasant and cooperative.  Head:  Normocephalic and atraumatic. Eyes:  Conjuctiva clear without scleral icterus. Mouth:  Oral mucosa pink and moist. Good dentition. No lesions. Heart: Normal rate and rhythm, s1 and s2 heart sounds present.  Lungs: Clear lung sounds in all lobes. Respirations equal and unlabored. Abdomen:  +BS, soft, non-tender and non-distended. No rebound or guarding. No HSM or masses noted. Derm: No palmar erythema or  jaundice Msk:  Symmetrical without gross deformities. Normal posture. Extremities:  Without edema. Neurologic:  Alert and  oriented x4 Psych:  Alert and cooperative. Normal mood and affect.  Invalid input(s): "6 MONTHS"   ASSESSMENT: Gail Henry is a 77 y.o. female presenting today to discuss Crohn's disease therapy alternatives to [facility]  Patient on Entyvio since feb 2023, recent Entyvio levels were adequate, seemed to be doing well clinically, though recent MRE showed ongoing inflammation, it was considered that she has had treatment failure with Entyvio and would benefit from different therapy to control her disease adequately.   Discussion was held regarding benefits and side effects of immunomodulators/biologicals (including infections, malignancies such as lymphoma, skin  cancer, tolerance to medication), as well as need to control her disease clinically and endoscopically (ideally microscopically as well) to avoid recurrence of her symptomatology. I have recommended Skyrizi or Stelara as two options that would be ideal for controlling her disease. I discussed these with the patient and recommended she look into both to decide which she feels she would like to start. In the meantime we will update CBC and CMP. She is up to date on Hep B and TB testing.   PLAN:  CMP, CBC  2. Pt to consider Skyrizi vs. Stelara and let us know how she wants to proceed  3. TB/HEp B due in December  4.  Continue Entyvio for now  All questions were answered, patient verbalized understanding and is in agreement with plan as outlined above.   Follow Up: 3 months   Ramone Gander L. Jeanmarie Hubert, MSN, APRN, AGNP-C Adult-Gerontology Nurse Practitioner [facility]  I have reviewed the note and agree with the APP's assessment as described in this progress note  Even though the patient has very mild symptoms there was evidence of active inflammation endoscopically and on imaging despite  Entyvio dose optimization.  She will think if she will like to pursue Skyrizi or Stelara.  [name], MD Gastroenterology and Hepatology [facility] Gastroenterology

## 2023-03-28 LAB — COMPREHENSIVE METABOLIC PANEL
AG Ratio: 1.3 (calc) (ref 1.0–2.5)
ALT: 20 U/L (ref 6–29)
Albumin: 4 g/dL (ref 3.6–5.1)
Alkaline phosphatase (APISO): 86 U/L (ref 37–153)
BUN/Creatinine Ratio: 14 (calc) (ref 6–22)
BUN: 8 mg/dL (ref 7–25)
CO2: 26 mmol/L (ref 20–32)
Calcium: 9.6 mg/dL (ref 8.6–10.4)
Chloride: 104 mmol/L (ref 98–110)
Creat: 0.58 mg/dL — ABNORMAL LOW (ref 0.60–1.00)
Globulin: 3 g/dL (ref 1.9–3.7)
Glucose, Bld: 81 mg/dL (ref 65–99)
Sodium: 141 mmol/L (ref 135–146)
Total Bilirubin: 0.4 mg/dL (ref 0.2–1.2)
Total Protein: 7 g/dL (ref 6.1–8.1)

## 2023-03-28 LAB — CBC WITH DIFFERENTIAL/PLATELET
Absolute Monocytes: 593 {cells}/uL (ref 200–950)
Basophils Absolute: 29 {cells}/uL (ref 0–200)
Basophils Relative: 0.5 %
Eosinophils Absolute: 91 {cells}/uL (ref 15–500)
Eosinophils Relative: 1.6 %
HCT: 40.7 % (ref 35.0–45.0)
Hemoglobin: 13.1 g/dL (ref 11.7–15.5)
Lymphs Abs: 1841 {cells}/uL (ref 850–3900)
MCH: 28.7 pg (ref 27.0–33.0)
MCHC: 32.2 g/dL (ref 32.0–36.0)
MCV: 89.3 fL (ref 80.0–100.0)
MPV: 10.7 fL (ref 7.5–12.5)
Monocytes Relative: 10.4 %
Neutro Abs: 3146 {cells}/uL (ref 1500–7800)
Neutrophils Relative %: 55.2 %
Platelets: 284 10*3/uL (ref 140–400)
RBC: 4.56 10*6/uL (ref 3.80–5.10)
RDW: 14 % (ref 11.0–15.0)
Total Lymphocyte: 32.3 %
WBC: 5.7 10*3/uL (ref 3.8–10.8)

## 2023-03-29 ENCOUNTER — Other Ambulatory Visit (INDEPENDENT_AMBULATORY_CARE_PROVIDER_SITE_OTHER): Payer: Self-pay

## 2023-03-29 ENCOUNTER — Telehealth (INDEPENDENT_AMBULATORY_CARE_PROVIDER_SITE_OTHER): Payer: Self-pay

## 2023-03-29 DIAGNOSIS — R110 Nausea: Secondary | ICD-10-CM

## 2023-03-29 DIAGNOSIS — E876 Hypokalemia: Secondary | ICD-10-CM

## 2023-03-29 DIAGNOSIS — N179 Acute kidney failure, unspecified: Secondary | ICD-10-CM

## 2023-03-29 DIAGNOSIS — R1013 Epigastric pain: Secondary | ICD-10-CM

## 2023-03-29 DIAGNOSIS — K5080 Crohn's disease of both small and large intestine without complications: Secondary | ICD-10-CM

## 2023-03-29 NOTE — Telephone Encounter (Signed)
I spoke with the patient and made aware that several tests on her Cmp was not done, and that the lab was trying to reach her. She says no one from [facility] has reached out to her. I have spoken with Cheslea and she says to repeat the entire panel as several tests were missing. I have placed a CMP to be done at [facility] the patient says she will have it drawn on 03/30/2023.

## 2023-03-29 NOTE — Telephone Encounter (Signed)
I called the patient today, left a message asked that she return call to the office.   [facility] came by the office yesterday afternoon stating they had a test that was hemolyzed which was the K+ from 03/27/2023 they asked if they had permission from [place] to contact the patient to have her come back in to have redrawn. I gave permission to reach out to the patient.

## 2023-03-30 LAB — COMPREHENSIVE METABOLIC PANEL
AG Ratio: 1.4 (calc) (ref 1.0–2.5)
ALT: 15 U/L (ref 6–29)
AST: 18 U/L (ref 10–35)
Albumin: 4.2 g/dL (ref 3.6–5.1)
Alkaline phosphatase (APISO): 96 U/L (ref 37–153)
BUN: 13 mg/dL (ref 7–25)
CO2: 27 mmol/L (ref 20–32)
Calcium: 9.5 mg/dL (ref 8.6–10.4)
Chloride: 104 mmol/L (ref 98–110)
Creat: 0.65 mg/dL (ref 0.60–1.00)
Globulin: 2.9 g/dL (ref 1.9–3.7)
Glucose, Bld: 90 mg/dL (ref 65–99)
Potassium: 4.3 mmol/L (ref 3.5–5.3)
Sodium: 140 mmol/L (ref 135–146)
Total Bilirubin: 0.5 mg/dL (ref 0.2–1.2)
Total Protein: 7.1 g/dL (ref 6.1–8.1)

## 2023-04-04 ENCOUNTER — Encounter (INDEPENDENT_AMBULATORY_CARE_PROVIDER_SITE_OTHER): Payer: Self-pay

## 2023-04-04 ENCOUNTER — Other Ambulatory Visit: Payer: Self-pay

## 2023-04-04 ENCOUNTER — Ambulatory Visit (INDEPENDENT_AMBULATORY_CARE_PROVIDER_SITE_OTHER): Payer: Medicare Other | Admitting: Gastroenterology

## 2023-04-04 ENCOUNTER — Telehealth (INDEPENDENT_AMBULATORY_CARE_PROVIDER_SITE_OTHER): Payer: Self-pay

## 2023-04-04 DIAGNOSIS — R7989 Other specified abnormal findings of blood chemistry: Secondary | ICD-10-CM | POA: Insufficient documentation

## 2023-04-04 DIAGNOSIS — K5080 Crohn's disease of both small and large intestine without complications: Secondary | ICD-10-CM

## 2023-04-04 DIAGNOSIS — Z8639 Personal history of other endocrine, nutritional and metabolic disease: Secondary | ICD-10-CM | POA: Insufficient documentation

## 2023-04-04 NOTE — Telephone Encounter (Signed)
Start [employer] and stop [employer].Orders per Dr. Levon Hedger have been placed in Epic for the infusion center.

## 2023-04-04 NOTE — Patient Instructions (Signed)
Start Skyrizi loading and then every 8 weeks Stop Entyvio Ask PCP about flu shot Perform blood workup one month after first dose of [employer]

## 2023-04-04 NOTE — Progress Notes (Signed)
Gail Henry, M.D. Gastroenterology & Hepatology [facility] Gastroenterology [address] [place], [place] [address] Primary Care Physician: Richardean Chimera, MD [address] [place] [address]  This is a telephone virtual visit.  It required patient-provider interaction for the medical decision making as documented below. The patient has consented and agreed to proceed with a Telehealth encounter.  VIRTUAL VISIT NOTE Patient location: home Provider location: office  I will communicate my assessment and recommendations to the referring MD via EMR.  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 female with past medical history of complex medical history of ileocolonic Crohn's disease status post colon resection x2 (once due to microperforation), GERD, coming for follow-up of Crohn's disease.  The patient was last seen on 03/27/2023 [facility]). At that time, the patient had a discussion with her team regarding the possibility of starting Skyrizi versus Stelara.  She called to her office as she wanted to discuss the potential side effects of these medications with me and the benefits of taking this medication.  Patient has 2-3 Bms per day. States stools are mostly soft. No melena or hematochezia. The patient denies having any nausea, vomiting, fever, chills, hematemesis, abdominal distention, abdominal pain, jaundice, pruritus or weight loss.  Patient had COVID at the  end of July 2024.  Patient reports that she has some plantar fascitis, is planning to receive some steroid shots in the future.  Previous medications: Regan Lemming  Last flu shot:2023 Last pneumonia shot: 2019 Last evaluation by dermatology: 2023 Last zoster vaccine:  2020 COVID-19 shot:  did the first round and one booster  Last Colonoscopy:06/2022 Presence of 5 small 3 to 4 mm ulcers consistent with Rutgeers i1.  There was presence of an anastomotic stenosis.  There was evidence of a prior end-to-side site ileocolonic anastomosis in the ascending colon with a stenosis measuring 1 cm x 0.5 cm.  8 mm polyp was removed from the anastomosis (inflammatory polyp), which was subsequently clipped.  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   Patient was considered to be in endoscopic remission.   Recommended repeat colonoscopy in 2 years due to longstanding history of ileocolonic Crohn's disease  Past Medical History: Past Medical History:  Diagnosis Date   Abdominal pain 10/18/2010   Anemia    Arthritis    Constipation 10/18/2010   Crohn's disease (HCC) 10/18/2010   SMALL BOWELS   Diarrhea 10/18/2010   Diverticulosis    Heart murmur    ECHO 2022   History of kidney stones    Kidney stone on left side     Past Surgical History: Past Surgical History:  Procedure Laterality Date   BIOPSY  06/02/2020   Procedure: BIOPSY;  Surgeon: Malissa Hippo, MD;  [facility];  Service: Endoscopy;;   BREAST SURGERY Right    0981,1914 Lumpectomy   COLONOSCOPY  10/16/2001   COLONOSCOPY  03/15/2012   Procedure: COLONOSCOPY;  Surgeon: Malissa Hippo, MD;  [facility];  Service: Endoscopy;  Laterality: N/A;  730   COLONOSCOPY WITH PROPOFOL N/A 06/02/2020   Procedure: COLONOSCOPY WITH PROPOFOL;  Surgeon: Malissa Hippo, MD;  [facility];  Service: Endoscopy;  Laterality: N/A;  730   COLONOSCOPY WITH PROPOFOL N/A 07/21/2022   Procedure: COLONOSCOPY WITH PROPOFOL;  Surgeon: Dolores Frame, MD;  [facility];  Service:  Gastroenterology;  Laterality: N/A;  9:45am, asa 1-2   CYSTOSCOPY W/ URETERAL STENT PLACEMENT Left 10/31/2022   Procedure: CYSTOSCOPY WITH RETROGRADE PYELOGRAM/URETERAL STENT PLACEMENT;  Surgeon: Sebastian Ache, MD;  [facility]: [facility];  Service: Urology;  Laterality: Left;   CYSTOSCOPY WITH RETROGRADE PYELOGRAM, URETEROSCOPY AND STENT PLACEMENT Left 11/15/2022   Procedure: CYSTOSCOPY WITH RETROGRADE PYELOGRAM, URETEROSCOPY AND STENT PLACEMENT, BASKET STONE REMOVAL;  Surgeon: Loletta Parish., MD;  Location: [facility];  Service: Urology;  Laterality: Left;  75 MINS   DILATION AND CURETTAGE OF UTERUS  09/1999   Polyp resection   ESOPHAGEAL DILATION N/A 06/02/2020   Procedure: ESOPHAGEAL DILATION;  Surgeon: Malissa Hippo, MD;  [facility];  Service: Endoscopy;  Laterality: N/A;   ESOPHAGOGASTRODUODENOSCOPY (EGD) WITH PROPOFOL N/A 06/02/2020   Procedure: ESOPHAGOGASTRODUODENOSCOPY (EGD) WITH PROPOFOL;  Surgeon: Malissa Hippo, MD;  [facility];  Service: Endoscopy;  Laterality: N/A;   HEMOSTASIS CLIP PLACEMENT  07/21/2022   Procedure: HEMOSTASIS CLIP PLACEMENT;  Surgeon: Dolores Frame, MD;  [facility];  Service: Gastroenterology;;   HOLMIUM LASER APPLICATION Left 11/15/2022   Procedure: HOLMIUM LASER APPLICATION;  Surgeon: Loletta Parish., MD;  [facility];  Service: Urology;  Laterality: Left;   LAPAROSCOPIC CHOLECYSTECTOMY  12/17/1996   DEMASON   POLYPECTOMY  07/21/2022   Procedure: POLYPECTOMY INTESTINAL;  Surgeon: Marguerita Merles, Reuel Boom, MD;  [facility];  Service: Gastroenterology;;    Family History: Family History  Problem Relation Age of Onset   Heart disease Father    Healthy Sister    Healthy Brother    Healthy Sister    Healthy Daughter    Healthy Son     Social History: Social History   Tobacco Use  Smoking Status Never   Passive exposure: Never  Smokeless Tobacco Never   Social History    Substance and Sexual Activity  Alcohol Use No   Alcohol/week: 0.0 standard drinks of alcohol   Social History   Substance and Sexual Activity  Drug Use No    Allergies: Allergies  Allergen Reactions   Penicillins Hives   Dexilant [Dexlansoprazole] Other (See Comments)    Muscle pain   Nexium [Esomeprazole Magnesium] Nausea Only    Medications: Current Outpatient Medications  Medication Sig Dispense Refill   acetaminophen (TYLENOL) 500 MG tablet Take 500 mg by mouth every 6 (six) hours as needed for moderate pain or headache.     Carboxymethylcellul-Glycerin (REFRESH RELIEVA OP) Place 1-2 drops into both eyes 3 (three) times daily as needed (dry/irritated eyes.).     Cholecalciferol (VITAMIN D3) 2000 UNITS TABS Take 2,000 Units by mouth in the morning.     docusate sodium (COLACE) 100 MG capsule Take 2 capsules (200 mg total) by mouth at bedtime. (Patient taking differently: Take 200 mg by mouth daily as needed (constipation.).)  0   Multiple Vitamins-Minerals (MULTIVITAMIN WITH MINERALS) tablet Take 2 tablets by mouth in the morning. forbia     nystatin-triamcinolone (MYCOLOG II) cream Apply 1 Application topically 2 (two) times daily as needed (skin irritation (skin folds)).     OVER THE COUNTER MEDICATION Vit b complex with c one daily     RABEprazole (ACIPHEX) 20 MG tablet TAKE 1 TABLET  BY MOUTH DAILY BEFORE breakfast 90 tablet 2   sodium chloride (OCEAN) 0.65 % SOLN nasal spray Place 1 spray into both nostrils in the morning and at bedtime.     vedolizumab (ENTYVIO) 300 MG injection Inject 300 mg into the vein every 8 (eight) weeks.     No current facility-administered medications for this visit.    Review of Systems: GENERAL: negative for malaise, night sweats HEENT: No changes in hearing or vision, no nose bleeds or other nasal problems. NECK: Negative for lumps, goiter, pain and significant neck swelling RESPIRATORY: Negative for cough, wheezing CARDIOVASCULAR:  Negative for chest pain, leg swelling, palpitations, orthopnea GI: SEE HPI MUSCULOSKELETAL: Negative for joint pain or swelling, back pain, and muscle pain. SKIN: Negative for lesions, rash PSYCH: Negative for sleep disturbance, mood disorder and recent psychosocial stressors. HEMATOLOGY Negative for prolonged bleeding, bruising easily, and swollen nodes. ENDOCRINE: Negative for cold or heat intolerance, polyuria, polydipsia and goiter. NEURO: negative for tremor, gait imbalance, syncope and seizures. The remainder of the review of systems is noncontributory.   Physical Exam: No exam was performed as this was a telephone encounter  Imaging/Labs: as above  I personally reviewed and interpreted the available labs, imaging and endoscopic files.  Impression and Plan: Gail Henry is a 77 y.o. female with past medical history of complex medical history of ileocolonic Crohn's disease status post colon resection x2 (once due to microperforation), GERD, coming for follow-up of Crohn's disease.  Patient was found to have active disease endoscopically and on imaging despite dose optimization with Entyvio.  Due to this, I consider Gail Henry will require treatment with a different type of advanced therapy for Crohn's disease. Discussion was held regarding benefits and side effects of Stelara and Skyrizi as these were the medications he was interested in discussing (including infections, malignancies such as lymphoma, skin cancer, tolerance to medication), as well as need to control her disease clinically and endoscopically (ideally microscopically as well) to avoid recurrence of her symptomatology.  Patient wants to start Skyrizi.  Order will be placed today to start infusion and maintenance doses subcutaneously.  She will need to have a CBC, CMP and CRP checked a month after starting Scouras  In terms of her preventative measures, she should ask her PCP about flu shot as the new strain will  be available this month.  -Start Skyrizi loading and then every 8 weeks -Stop Entyvio -Ask PCP about flu shot - CBC, CMP and CRP one month after first dose of Skyrizi  All questions were answered.      Total visit time: I spent a total of  30 minutes  Gail Blazing, MD Gastroenterology and Hepatology [facility] Gastroenterology

## 2023-04-05 ENCOUNTER — Other Ambulatory Visit: Payer: Self-pay

## 2023-04-05 NOTE — Telephone Encounter (Signed)
Awaiting the appointment date and time for the first infusion.  Bayard Hugger aware of the new Start on [place] and to stop/ discontinue the [employer] orders. They will get authorization for the infusion center  for the IV portion and we will get the authorization on the on body infuser.

## 2023-04-06 NOTE — Telephone Encounter (Signed)
She will need to have a CBC, CMP and CRP checked a month after starting [employer].

## 2023-04-09 ENCOUNTER — Telehealth: Payer: Self-pay

## 2023-04-09 NOTE — Telephone Encounter (Signed)
Per the [facility] infusion center.  Hello,   Auth Submission: DENIED Site of care: [facility] of care: [facility] Payer: BLUE CROSS BLUE SHIELD  Medication & CPT/J Code(s) submitted: Skyrizi Merlyn Albert) [phone] Route of submission (phone, fax, portal): portal/fax Phone # [phone] Fax # [phone] Auth type: Buy/Bill PB Units/visits requested: 600mg , q4 weeks, 3 doses Reference number: [id number]       Authorization has been DENIED because patient has not tried Avsola or Stelara. You have the right to appeal this decision at the number above. The denial letter is scanned in patient's media tab if you would like to review.

## 2023-04-09 NOTE — Telephone Encounter (Signed)
Hello,  Auth Submission: DENIED Site of care: [facility] of care: [facility] Payer: BLUE CROSS BLUE SHIELD  Medication & CPT/J Code(s) submitted: Skyrizi Merlyn Albert) [phone] Route of submission (phone, fax, portal): portal/fax Phone # [phone] Fax # [phone] Auth type: Buy/Bill PB Units/visits requested: 600mg , q4 weeks, 3 doses Reference number: [id number]    Authorization has been DENIED because patient has not tried Avsola or Stelara. You have the right to appeal this decision at the number above. The denial letter is scanned in patient's media tab if you would like to review.

## 2023-04-11 ENCOUNTER — Other Ambulatory Visit (INDEPENDENT_AMBULATORY_CARE_PROVIDER_SITE_OTHER): Payer: Self-pay | Admitting: Gastroenterology

## 2023-04-11 NOTE — Telephone Encounter (Signed)
I spoke to patient today, we decided that we will try Stelara instead.  I discontinued the order for [facility] and placed the order for Stelara IV infusion dose. Charmayne Sheer, please help [place] with the authorization for this.  Crystal, please start the authorization process for the subcutaneous injections.  Thanks

## 2023-04-11 NOTE — Telephone Encounter (Signed)
Noted, I will start the Sq authorization as soon as I know the IV portion has been scheduled.       Note   Dolores Frame, MD  You; Wynelle Link, CPhT2 hours ago (2:38 PM)    I spoke to patient today, we decided that we will try Stelara instead.  I discontinued the order for [facility] and placed the order for Stelara IV infusion dose. Charmayne Sheer, please help [place] with the authorization for this.  Platon Arocho, please start the authorization process for the subcutaneous injections.   Thanks

## 2023-04-11 NOTE — Telephone Encounter (Signed)
Noted, I will start the Sq authorization as soon as I know the IV portion has been scheduled.

## 2023-04-12 ENCOUNTER — Telehealth: Payer: Self-pay

## 2023-04-12 NOTE — Telephone Encounter (Signed)
Thanks

## 2023-04-12 NOTE — Telephone Encounter (Signed)
Auth for stelara has been submitted, Will update you all as soon as I hear back.

## 2023-04-12 NOTE — Telephone Encounter (Addendum)
Auth Submission: APPROVED Site of care: Site of care: [facility] Payer: [employer]  Medication & CPT/J Code(s) submitted: Stelara Infusion (Ustekinumab) [id number] Route of submission (phone, fax, portal): phone/fax Phone # Fax # Auth type: Buy/Bill PB Units/visits requested: 520units Reference number: [id number] Approval from: 04/12/23 to 06/11/23

## 2023-04-12 NOTE — Telephone Encounter (Signed)
Thanks       Note   Dalton, [place], CPhT  You; Gail Henry, Gail Henry, MD1 hour ago (8:49 AM)    Berkley Harvey for [name] has been submitted, Will update you all as soon as I hear back.

## 2023-04-16 NOTE — Telephone Encounter (Signed)
Thank you :)

## 2023-04-16 NOTE — Telephone Encounter (Signed)
Good morning,  Stelara has been approved for [facility], she will be scheduled as soon as possible.  Thanks

## 2023-04-19 NOTE — Telephone Encounter (Signed)
Per Charmayne Sheer at the [facility] Infusion center,I have changed her 05/10/23 entyvio appt to her stelara dose. I have called and informed patient.

## 2023-04-24 NOTE — Telephone Encounter (Signed)
Stelara Sq due on 07/05/2023 if her infusion is scheduled for 05/10/2023. We will need to get PA done on the sq dose prior to this.

## 2023-05-10 ENCOUNTER — Encounter: Payer: Medicare Other | Attending: Gastroenterology | Admitting: Emergency Medicine

## 2023-05-10 VITALS — BP 119/75 | HR 62 | Temp 98.1°F | Resp 18 | Wt 174.4 lb

## 2023-05-10 DIAGNOSIS — K50019 Crohn's disease of small intestine with unspecified complications: Secondary | ICD-10-CM | POA: Diagnosis present

## 2023-05-10 MED ORDER — USTEKINUMAB 130 MG/26ML IV SOLN
390.0000 mg | Freq: Once | INTRAVENOUS | Status: AC
  Administered 2023-05-10: 390 mg via INTRAVENOUS
  Filled 2023-05-10: qty 78

## 2023-05-10 NOTE — Progress Notes (Signed)
Diagnosis: Crohn's Disease  Provider:  [name] MD  Procedure: IV Infusion  IV Type: Peripheral, IV Location: L Antecubital  Stelera (Ustekinumab), Dose: 390 mg  Infusion Start Time: 0919  Infusion Stop Time: 1030  Post Infusion IV Care: Observation period completed  Discharge: Condition: Good, Destination: Home . AVS Provided  Performed by:  Arrie Senate, RN

## 2023-05-17 ENCOUNTER — Inpatient Hospital Stay (HOSPITAL_COMMUNITY): Admission: RE | Admit: 2023-05-17 | Payer: Medicare Other | Source: Ambulatory Visit

## 2023-06-12 ENCOUNTER — Encounter (INDEPENDENT_AMBULATORY_CARE_PROVIDER_SITE_OTHER): Payer: Self-pay

## 2023-06-12 ENCOUNTER — Other Ambulatory Visit (INDEPENDENT_AMBULATORY_CARE_PROVIDER_SITE_OTHER): Payer: Self-pay

## 2023-06-12 DIAGNOSIS — K5080 Crohn's disease of both small and large intestine without complications: Secondary | ICD-10-CM

## 2023-06-12 LAB — COMPREHENSIVE METABOLIC PANEL
AG Ratio: 1.3 (calc) (ref 1.0–2.5)
ALT: 16 U/L (ref 6–29)
AST: 18 U/L (ref 10–35)
Albumin: 4 g/dL (ref 3.6–5.1)
Alkaline phosphatase (APISO): 97 U/L (ref 37–153)
BUN: 10 mg/dL (ref 7–25)
CO2: 30 mmol/L (ref 20–32)
Calcium: 9.6 mg/dL (ref 8.6–10.4)
Chloride: 103 mmol/L (ref 98–110)
Creat: 0.61 mg/dL (ref 0.60–1.00)
Globulin: 3.2 g/dL (ref 1.9–3.7)
Glucose, Bld: 84 mg/dL (ref 65–99)
Potassium: 4 mmol/L (ref 3.5–5.3)
Sodium: 143 mmol/L (ref 135–146)
Total Bilirubin: 0.4 mg/dL (ref 0.2–1.2)
Total Protein: 7.2 g/dL (ref 6.1–8.1)

## 2023-06-12 LAB — CBC WITH DIFFERENTIAL/PLATELET
Absolute Lymphocytes: 1867 {cells}/uL (ref 850–3900)
Absolute Monocytes: 421 {cells}/uL (ref 200–950)
Basophils Absolute: 18 {cells}/uL (ref 0–200)
Basophils Relative: 0.3 %
Eosinophils Absolute: 98 {cells}/uL (ref 15–500)
Eosinophils Relative: 1.6 %
HCT: 42.8 % (ref 35.0–45.0)
Hemoglobin: 13.8 g/dL (ref 11.7–15.5)
MCH: 29 pg (ref 27.0–33.0)
MCHC: 32.2 g/dL (ref 32.0–36.0)
MCV: 89.9 fL (ref 80.0–100.0)
MPV: 10.2 fL (ref 7.5–12.5)
Monocytes Relative: 6.9 %
Neutro Abs: 3697 {cells}/uL (ref 1500–7800)
Neutrophils Relative %: 60.6 %
Platelets: 323 10*3/uL (ref 140–400)
RBC: 4.76 10*6/uL (ref 3.80–5.10)
RDW: 13.9 % (ref 11.0–15.0)
Total Lymphocyte: 30.6 %
WBC: 6.1 10*3/uL (ref 3.8–10.8)

## 2023-06-12 LAB — C-REACTIVE PROTEIN: CRP: 6.5 mg/L (ref <8.0)

## 2023-06-12 MED ORDER — STELARA 90 MG/ML ~~LOC~~ SOSY: 90.0000 mg | PREFILLED_SYRINGE | SUBCUTANEOUS | 5 refills | Status: DC

## 2023-06-12 NOTE — Telephone Encounter (Signed)
Trying to do Prior Auth on the patient's SQ Stelara 90 mg Q 8 weeks. Cover my meds is down and I have tried calling the patient insurance to get Auth and never go through to a customer service rep. I will await Cover My Med to come back up to finish the Authorization.

## 2023-06-12 NOTE — Telephone Encounter (Signed)
My Chart message sent to the patient today:  Per Dr. Levon Hedger, the CBC, CMP and CRP were normal. I have submitted the Stelara to [facility] this am, and I am working on getting a Prior Authorization from [employer] on this. I will keep you posted. Thanks

## 2023-06-12 NOTE — Telephone Encounter (Signed)
Patient approved for Stelara with me $5.00 co pay card.

## 2023-06-13 ENCOUNTER — Other Ambulatory Visit (INDEPENDENT_AMBULATORY_CARE_PROVIDER_SITE_OTHER): Payer: Self-pay | Admitting: *Deleted

## 2023-06-13 ENCOUNTER — Telehealth (INDEPENDENT_AMBULATORY_CARE_PROVIDER_SITE_OTHER): Payer: Self-pay

## 2023-06-13 NOTE — Telephone Encounter (Signed)
Notice of Approval Date: 06/13/2023 ZYMERIA MARESH [address] [place], [place] [address] Plan Member Name: [name] Plan Member ID:[id number] Prescriber Name: [name] Prescriber Phone: [phone] Prescriber Fax: [phone] Plan-approved Criteria used for review: Hoyle Sauer SGM Dear Claris Gower Cumby: [facility] received a request from your prescriber for coverage of Stelara SQ 90mg . As long as you remain covered by the [facility] and there are no changes to your plan benefits, this request is approved for the following time period: 06/13/2023 - 06/12/2024 Approvals may be subject to dosing limits in accordance with FDA approved labeling, evidence-based practice guidelines or your pharmacy plan benefits. You will need to fill your specialty medications at a select participating pharmacy in your plan's network. Sincerely, [facility] PA# [facility] Plan [phone] [facility]

## 2023-06-13 NOTE — Telephone Encounter (Signed)
Notice of Approval Date: 06/13/2023 Gail Henry [address] [place], [place] [address] Plan Member Name: Gail Henry Plan Member ID:[id number] Prescriber Name: [name] Prescriber Phone: [phone] Prescriber Fax: [phone] Plan-approved Criteria used for review: Hoyle Sauer SGM Dear Gail Henry: [facility] received a request from your prescriber for coverage of Stelara SQ 90mg . As long as you remain covered by the [facility] and there are no changes to your plan benefits, this request is approved for the following time period: 06/13/2023 - 06/12/2024 Approvals may be subject to dosing limits in accordance with FDA approved labeling, evidence-based practice guidelines or your pharmacy plan benefits. You will need to fill your specialty medications at a select participating pharmacy in your plan's network. Sincerely, [facility] PA# [facility] Plan [phone] [facility]

## 2023-06-18 NOTE — Telephone Encounter (Signed)
I spoke with the patient she says she has spoken with the pharmacy and has everything set, she should received around 06/20/2023. Her co payment with be $5.00 per month and she says she was contacted by Linwood Dibbles about someone coming out to her home to show her how to administer the sq injection, she told them she knew how to administer this and if any questions she will notify us here at the office. She was made aware she is not to inject until around 07/05/2023, which will be eight weeks from her infusion.

## 2023-06-25 ENCOUNTER — Ambulatory Visit (INDEPENDENT_AMBULATORY_CARE_PROVIDER_SITE_OTHER): Payer: Medicare Other | Admitting: Gastroenterology

## 2023-06-25 ENCOUNTER — Encounter (INDEPENDENT_AMBULATORY_CARE_PROVIDER_SITE_OTHER): Payer: Self-pay | Admitting: Gastroenterology

## 2023-06-25 VITALS — BP 130/82 | HR 76 | Temp 98.0°F | Ht 66.5 in | Wt 176.9 lb

## 2023-06-25 DIAGNOSIS — K5080 Crohn's disease of both small and large intestine without complications: Secondary | ICD-10-CM | POA: Diagnosis not present

## 2023-06-25 NOTE — Progress Notes (Unsigned)
Gail Henry, M.D. Gastroenterology & Hepatology [facility] Gastroenterology [address] [place], [place] [address]  Primary Care Physician: Richardean Chimera, MD [address] [place] [address]  I will communicate my assessment and recommendations to the referring MD via EMR.  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 female with past medical history of complex medical history of ileocolonic Crohn's disease status post colon resection x2 (once due to microperforation), GERD, coming for follow-up of Crohn's disease.   The patient was last seen on 04/04/2023. At that time, the patient was advised to stop Entyvio and she was started on Stelara.  Her first dose of Stelara was on 05/10/2023.  Patient reports she has noticed her bowels are more slightly more formed. She is having 2-3 Bms per day, number of Bms depends on what she eats. Sometimes feels some pain episodes in her RLQ and in the left flank, which may feel somewhat sore. The patient denies having any nausea, vomiting, fever, chills, hematochezia, melena, hematemesis, abdominal distention, jaundice, pruritus or weight loss.  Patient reports that when she eats red sauce or tomato based food, she present some heartburn. Has tried avoiding these foods as the symptoms are only present with it.symptoms are present Has not taken any medicines for this. No dysphagia or odynophagia regularly.  Last flu shot:2024 Last pneumonia shot: 2019 Last evaluation by dermatology: 2023, has appointment tomorrow Last zoster vaccine: 2020 COVID-19 shot:  did the first round and one booster  Previously used biologicals: Entyvio   Last Colonoscopy:06/2022 Presence of 5 small 3 to 4 mm ulcers consistent with  Rutgeers i1.  There was presence of an anastomotic stenosis.  There was evidence of a prior end-to-side site ileocolonic anastomosis in the ascending colon with a stenosis measuring 1 cm x 0.5 cm.  8 mm polyp was removed from the anastomosis (inflammatory polyp), which was subsequently clipped.  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   Recommended repeat colonoscopy in 2 years due to longstanding history of ileocolonic Crohn's disease  Past Medical History: Past Medical History:  Diagnosis Date   Abdominal pain 10/18/2010   Anemia    Arthritis    Constipation 10/18/2010   Crohn's disease (HCC) 10/18/2010   SMALL BOWELS   Diarrhea 10/18/2010   Diverticulosis    Heart murmur    ECHO 2022   History of kidney stones    Kidney stone on left side     Past Surgical History: Past Surgical History:  Procedure Laterality Date   BIOPSY  06/02/2020   Procedure: BIOPSY;  Surgeon: Malissa Hippo, MD;  [facility];  Service: Endoscopy;;   BREAST SURGERY Right    0981,1914 Lumpectomy   COLONOSCOPY  10/16/2001   COLONOSCOPY  03/15/2012   Procedure: COLONOSCOPY;  Surgeon: Malissa Hippo, MD;  [facility];  Service: Endoscopy;  Laterality: N/A;  730   COLONOSCOPY WITH PROPOFOL N/A 06/02/2020   Procedure: COLONOSCOPY WITH PROPOFOL;  Surgeon: Malissa Hippo, MD;  [facility];  Service: Endoscopy;  Laterality: N/A;  730   COLONOSCOPY WITH PROPOFOL N/A 07/21/2022   Procedure: COLONOSCOPY WITH  PROPOFOL;  Surgeon: Marguerita Merles, Reuel Boom, MD;  [facility];  Service: Gastroenterology;  Laterality: N/A;  9:45am, asa 1-2   CYSTOSCOPY W/ URETERAL STENT PLACEMENT Left 10/31/2022   Procedure: CYSTOSCOPY WITH RETROGRADE PYELOGRAM/URETERAL STENT PLACEMENT;  Surgeon: Sebastian Ache, MD;  [facility];  Service: Urology;  Laterality: Left;   CYSTOSCOPY WITH RETROGRADE PYELOGRAM, URETEROSCOPY AND STENT PLACEMENT Left 11/15/2022   Procedure: CYSTOSCOPY WITH RETROGRADE PYELOGRAM, URETEROSCOPY AND STENT PLACEMENT, BASKET STONE REMOVAL;  Surgeon: Loletta Parish., MD;  [facility];  Service: Urology;  Laterality: Left;  75 MINS   DILATION AND CURETTAGE OF UTERUS  09/1999   Polyp resection   ESOPHAGEAL DILATION N/A 06/02/2020   Procedure: ESOPHAGEAL DILATION;  Surgeon: Malissa Hippo, MD;  [facility];  Service: Endoscopy;  Laterality: N/A;   ESOPHAGOGASTRODUODENOSCOPY (EGD) WITH PROPOFOL N/A 06/02/2020   Procedure: ESOPHAGOGASTRODUODENOSCOPY (EGD) WITH PROPOFOL;  Surgeon: Malissa Hippo, MD;  [facility];  Service: Endoscopy;  Laterality: N/A;   HEMOSTASIS CLIP PLACEMENT  07/21/2022   Procedure: HEMOSTASIS CLIP PLACEMENT;  Surgeon: Dolores Frame, MD;  [facility];  Service: Gastroenterology;;   HOLMIUM LASER APPLICATION Left 11/15/2022   Procedure: HOLMIUM LASER APPLICATION;  Surgeon: Loletta Parish., MD;  [facility];  Service: Urology;  Laterality: Left;   LAPAROSCOPIC CHOLECYSTECTOMY  12/17/1996   DEMASON   POLYPECTOMY  07/21/2022   Procedure: POLYPECTOMY INTESTINAL;  Surgeon: Marguerita Merles, Reuel Boom, MD;  [facility];  Service: Gastroenterology;;    Family History: Family History  Problem Relation Age of Onset   Heart disease Father    Healthy Sister    Healthy Brother    Healthy Sister    Healthy Daughter    Healthy Son     Social History: Social History   Tobacco Use  Smoking Status Never   Passive exposure: Never  Smokeless Tobacco Never   Social History   Substance and Sexual Activity  Alcohol Use No   Alcohol/week: 0.0 standard drinks of alcohol   Social History   Substance and Sexual Activity  Drug Use No    Allergies: Allergies  Allergen  Reactions   Penicillins Hives   Dexilant [Dexlansoprazole] Other (See Comments)    Muscle pain   Nexium [Esomeprazole Magnesium] Nausea Only    Medications: Current Outpatient Medications  Medication Sig Dispense Refill   acetaminophen (TYLENOL) 500 MG tablet Take 500 mg by mouth every 6 (six) hours as needed for moderate pain or headache.     Carboxymethylcellul-Glycerin (REFRESH RELIEVA OP) Place 1-2 drops into both eyes 3 (three) times daily as needed (dry/irritated eyes.).     Cholecalciferol (VITAMIN D3) 2000 UNITS TABS Take 2,000 Units by mouth in the morning.     docusate sodium (COLACE) 100 MG capsule Take 2 capsules (200 mg total) by mouth at bedtime. (Patient taking differently: Take 200 mg by mouth daily as needed (constipation.).)  0   Multiple Vitamins-Minerals (MULTIVITAMIN WITH MINERALS) tablet Take 2 tablets by mouth in the morning. forbia     OVER THE COUNTER MEDICATION Vit b complex with c one daily     RABEprazole (ACIPHEX) 20 MG tablet TAKE 1 TABLET BY MOUTH DAILY BEFORE breakfast 90 tablet 2   sodium chloride (OCEAN) 0.65 % SOLN nasal spray Place 1 spray into both nostrils in the morning and at bedtime.     ustekinumab (STELARA) 90 MG/ML SOSY injection Inject 1 mL (90 mg total)  into the skin every 8 (eight) weeks. Due for SQ on 07/05/2023. 1 mL 5   No current facility-administered medications for this visit.    Review of Systems: GENERAL: negative for malaise, night sweats HEENT: No changes in hearing or vision, no nose bleeds or other nasal problems. NECK: Negative for lumps, goiter, pain and significant neck swelling RESPIRATORY: Negative for cough, wheezing CARDIOVASCULAR: Negative for chest pain, leg swelling, palpitations, orthopnea GI: SEE HPI MUSCULOSKELETAL: Negative for joint pain or swelling, back pain, and muscle pain. SKIN: Negative for lesions, rash PSYCH: Negative for sleep disturbance, mood disorder and recent psychosocial stressors. HEMATOLOGY  Negative for prolonged bleeding, bruising easily, and swollen nodes. ENDOCRINE: Negative for cold or heat intolerance, polyuria, polydipsia and goiter. NEURO: negative for tremor, gait imbalance, syncope and seizures. The remainder of the review of systems is noncontributory.   Physical Exam: BP 130/82 (BP Location: Left Arm, Patient Position: Sitting, Cuff Size: Normal)   Pulse 76   Temp 98 F (36.7 C) (Oral)   Ht 5' 6.5" (1.689 m)   Wt 176 lb 14.4 oz (80.2 kg)   BMI 28.12 kg/m  GENERAL: The patient is AO x3, in no acute distress. HEENT: Head is normocephalic and atraumatic. EOMI are intact. Mouth is well hydrated and without lesions. NECK: Supple. No masses LUNGS: Clear to auscultation. No presence of rhonchi/wheezing/rales. Adequate chest expansion HEART: RRR, normal s1 and s2. ABDOMEN: Soft, nontender, no guarding, no peritoneal signs, and nondistended. BS +. No masses. RECTAL EXAM: no external lesions, normal tone, no masses, brown stool without blood.*** Chaperone: EXTREMITIES: Without any cyanosis, clubbing, rash, lesions or edema. NEUROLOGIC: AOx3, no focal motor deficit. SKIN: no jaundice, no rashes  Imaging/Labs: as above  I personally reviewed and interpreted the available labs, imaging and endoscopic files.  Impression and Plan: CHRISANDRA STOLTENBERG is a 77 y.o. female coming for follow up of ***  - Will check vitamin D, iron stores and folic acid/B12.  All questions were answered.      Gail Blazing, MD Gastroenterology and Hepatology [facility] Gastroenterology

## 2023-06-25 NOTE — Patient Instructions (Signed)
Continue Stelara every 8 weeks Will consider checking Stelara levels in follow-up appointment depending on symptom control Follow-up with dermatologist tomorrow

## 2023-07-12 ENCOUNTER — Inpatient Hospital Stay (HOSPITAL_COMMUNITY): Admission: RE | Admit: 2023-07-12 | Payer: Medicare Other | Source: Ambulatory Visit

## 2023-09-20 ENCOUNTER — Ambulatory Visit: Payer: Medicare Other | Attending: Podiatry

## 2023-09-20 DIAGNOSIS — M79672 Pain in left foot: Secondary | ICD-10-CM | POA: Insufficient documentation

## 2023-09-20 NOTE — Therapy (Signed)
 OUTPATIENT PHYSICAL THERAPY LOWER EXTREMITY EVALUATION   Patient Name: JAMICIA HAALAND MRN: [id number] DOB:Aug 04, 1945, 78 y.o., female Today's Date: 09/20/2023  END OF SESSION:  PT End of Session - 09/20/23 1043     Visit Number 1    Number of Visits 8    Date for PT Re-Evaluation 11/23/23    PT Start Time 1059    PT Stop Time 1127    PT Time Calculation (min) 28 min    Activity Tolerance Patient tolerated treatment well    Behavior During Therapy [facility] for tasks assessed/performed             Past Medical History:  Diagnosis Date   Abdominal pain 10/18/2010   Anemia    Arthritis    Constipation 10/18/2010   Crohn's disease (HCC) 10/18/2010   SMALL BOWELS   Diarrhea 10/18/2010   Diverticulosis    Heart murmur    ECHO 2022   History of kidney stones    Kidney stone on left side    Past Surgical History:  Procedure Laterality Date   BIOPSY  06/02/2020   Procedure: BIOPSY;  Surgeon: Malissa Hippo, MD;  [facility];  Service: Endoscopy;;   BREAST SURGERY Right    4098,[id number] Lumpectomy   COLONOSCOPY  10/16/2001   COLONOSCOPY  03/15/2012   Procedure: COLONOSCOPY;  Surgeon: Malissa Hippo, MD;  [facility];  Service: Endoscopy;  Laterality: N/A;  730   COLONOSCOPY WITH PROPOFOL N/A 06/02/2020   Procedure: COLONOSCOPY WITH PROPOFOL;  Surgeon: Malissa Hippo, MD;  [facility];  Service: Endoscopy;  Laterality: N/A;  730   COLONOSCOPY WITH PROPOFOL N/A 07/21/2022   Procedure: COLONOSCOPY WITH PROPOFOL;  Surgeon: Dolores Frame, MD;  [facility];  Service: Gastroenterology;  Laterality: N/A;  9:45am, asa 1-2   CYSTOSCOPY W/ URETERAL STENT PLACEMENT Left 10/31/2022   Procedure: CYSTOSCOPY WITH RETROGRADE PYELOGRAM/URETERAL STENT PLACEMENT;  Surgeon: Sebastian Ache, MD;  [facility];  Service: Urology;  Laterality: Left;   CYSTOSCOPY WITH RETROGRADE PYELOGRAM, URETEROSCOPY AND STENT PLACEMENT Left 11/15/2022    Procedure: CYSTOSCOPY WITH RETROGRADE PYELOGRAM, URETEROSCOPY AND STENT PLACEMENT, BASKET STONE REMOVAL;  Surgeon: Loletta Parish., MD;  [facility];  Service: Urology;  Laterality: Left;  75 MINS   DILATION AND CURETTAGE OF UTERUS  09/1999   Polyp resection   ESOPHAGEAL DILATION N/A 06/02/2020   Procedure: ESOPHAGEAL DILATION;  Surgeon: Malissa Hippo, MD;  [facility];  Service: Endoscopy;  Laterality: N/A;   ESOPHAGOGASTRODUODENOSCOPY (EGD) WITH PROPOFOL N/A 06/02/2020   Procedure: ESOPHAGOGASTRODUODENOSCOPY (EGD) WITH PROPOFOL;  Surgeon: Malissa Hippo, MD;  [facility];  Service: Endoscopy;  Laterality: N/A;   HEMOSTASIS CLIP PLACEMENT  07/21/2022   Procedure: HEMOSTASIS CLIP PLACEMENT;  Surgeon: Dolores Frame, MD;  [facility];  Service: Gastroenterology;;   HOLMIUM LASER APPLICATION Left 11/15/2022   Procedure: HOLMIUM LASER APPLICATION;  Surgeon: Loletta Parish., MD;  [facility];  Service: Urology;  Laterality: Left;   LAPAROSCOPIC CHOLECYSTECTOMY  12/17/1996   DEMASON   POLYPECTOMY  07/21/2022   Procedure: POLYPECTOMY INTESTINAL;  Surgeon: Dolores Frame, MD;  [facility];  Service: Gastroenterology;;   Patient Active Problem List   Diagnosis Date Noted   History of hypokalemia 04/04/2023   Abnormal complete blood count 04/04/2023   Hydronephrosis concurrent with and due to calculi of kidney and ureter 10/31/2022   Hypokalemia  10/31/2022   AKI (acute kidney injury) (HCC) 10/31/2022   Burning sensation of mouth 01/05/2022   Crohn's disease, small intestine (HCC) 10/31/2021   Osteopenia 10/26/2020   Crohn's disease of both small and large intestine (HCC) 04/27/2020   History of colonic polyps 10/21/2019   Abdominal pain, epigastric 02/17/2013   Nausea 02/17/2013   GERD (gastroesophageal reflux disease) 08/22/2011   Fracture of rib of right side 08/22/2011   Constipation  10/18/2010    PCP: Richardean Chimera, MD  REFERRING PROVIDER: Adam Phenix, DPM  REFERRING DIAG: Recurrent left heel pain  THERAPY DIAG:  Pain in left foot  Rationale for Evaluation and Treatment: Rehabilitation  ONSET DATE: June 2024  SUBJECTIVE:   SUBJECTIVE STATEMENT: Patient reports that she began having left foot pain in June 2024 while she was moving houses. She is unable to take most medications due to her Crohn's Disease. She feels that injections help, but her last injection was in December or early January 2025. She had pain like this years ago, but the shots helped it go away. Her pain had been gradually getting worse prior to her injections.   PERTINENT HISTORY: Osteopenia, Crohn's disease, and arthritis PAIN:  Are you having pain? Yes: NPRS scale: Current: 2-3/10 Best: 0/10 Worst: 2-3/10 Pain location: left heel Pain description: intermittent burning Aggravating factors: getting up in the morning, standing, walking  Relieving factors: injections  PRECAUTIONS: None  RED FLAGS: None   WEIGHT BEARING RESTRICTIONS: No  FALLS:  Has patient fallen in last 6 months? No  LIVING ENVIRONMENT: Lives with: lives alone Lives in: House/apartment Stairs: No Has following equipment at home: None  OCCUPATION: part-time in education, but primarily sitting  PLOF: Independent  PATIENT GOALS: reduced pain, be able to go back to the gym (2-3 times per week prior to injury, but none current), improved balance and safety going down steps, and be able to walk longer  NEXT MD VISIT: summer 2025  OBJECTIVE:  Note: Objective measures were completed at Evaluation unless otherwise noted.  COGNITION: Overall cognitive status: Within functional limits for tasks assessed     SENSATION: Patient reports no numbness or tingling  PALPATION: TTP: left calcaneus   LOWER EXTREMITY ROM:  Active ROM Right eval Left eval  Hip flexion    Hip extension    Hip abduction    Hip  adduction    Hip internal rotation    Hip external rotation    Knee flexion    Knee extension    Ankle dorsiflexion -5 -10; "pulling" in calf and heel   Ankle plantarflexion 40 45  Ankle inversion 30 30  Ankle eversion 27 25   (Blank rows = not tested)  LOWER EXTREMITY MMT:  MMT Right eval Left eval  Hip flexion    Hip extension    Hip abduction    Hip adduction    Hip internal rotation    Hip external rotation    Knee flexion    Knee extension    Ankle dorsiflexion 4+/5 5/5  Ankle plantarflexion    Ankle inversion 5/5 5/5  Ankle eversion 4/5 4/5   (Blank rows = not tested)  GAIT: Assistive device utilized: None Level of assistance: Complete Independence Comments: No significant gait deviations observed  TREATMENT DATE:     PATIENT EDUCATION:  Education details: Plan of care, prognosis, healing, objective findings, and goals for physical therapy Person educated: Patient Education method: Explanation Education comprehension: verbalized understanding  HOME EXERCISE PROGRAM:   ASSESSMENT:  CLINICAL IMPRESSION: Patient is a [birth year]76 y.o. female who was seen today for physical therapy evaluation and treatment for left plantars fasciitis. She presented with low pain severity and irritability with palpation to her left calcaneus being the most aggravating to her familiar symptoms. She also exhibited reduced left ankle dorsiflexion compared to the right. Recommend that she continue with skilled physical therapy to address her impairments to return to her prior level of function.  OBJECTIVE IMPAIRMENTS: decreased activity tolerance, decreased mobility, difficulty walking, decreased ROM, and pain.   ACTIVITY LIMITATIONS: standing and locomotion level  PARTICIPATION LIMITATIONS: shopping and community activity  PERSONAL FACTORS: Age,  Past/current experiences, Time since onset of injury/illness/exacerbation, and 3+ comorbidities: Osteopenia, Crohn's disease, and arthritis  are also affecting patient's functional outcome.   REHAB POTENTIAL: Good  CLINICAL DECISION MAKING: Stable/uncomplicated  EVALUATION COMPLEXITY: Low   GOALS: Goals reviewed with patient? Yes  LONG TERM GOALS: Target date: 10/18/23  Patient will be independent with her HEP. Baseline:  Goal status: INITIAL  2.  Patient will improve her left ankle dorsiflexion to within 5 degrees of neutral for improved ankle mobility. Baseline:  Goal status: INITIAL  3.  Patient will be able to complete her daily activities without her familiar pain exceeding 1/10. Baseline:  Goal status: INITIAL  4.  Patient will be able to return to her normal gym activities without being limited by her familiar left foot symptoms. Baseline:  Goal status: INITIAL  PLAN:  PT FREQUENCY: 2x/week  PT DURATION: 4 weeks  PLANNED INTERVENTIONS: 78295- PT Re-evaluation, 97110-Therapeutic exercises, 97530- Therapeutic activity, 97112- Neuromuscular re-education, 97535- Self Care, 62130- Manual therapy, [id number]- Electrical stimulation (unattended), 97016- Vasopneumatic device, Patient/Family education, Balance training, Stair training, Joint mobilization, Cryotherapy, and Moist heat  PLAN FOR NEXT SESSION: NuStep, manual therapy, rocker board, and modalities as needed   Granville Lewis, PT 09/20/2023, 11:40 AM

## 2023-09-25 ENCOUNTER — Encounter: Payer: Self-pay | Admitting: Physical Therapy

## 2023-09-25 ENCOUNTER — Ambulatory Visit: Payer: Medicare Other | Admitting: Physical Therapy

## 2023-09-25 DIAGNOSIS — M79672 Pain in left foot: Secondary | ICD-10-CM | POA: Diagnosis not present

## 2023-09-25 NOTE — Therapy (Signed)
 OUTPATIENT PHYSICAL THERAPY LOWER EXTREMITY TREATMENT   Patient Name: Gail Henry MRN: [id number] DOB:[birth year], 78 y.o., female Today's Date: 09/25/2023  END OF SESSION:  PT End of Session - 09/25/23 0926     Visit Number 2    Number of Visits 8    Date for PT Re-Evaluation 11/23/23    PT Start Time 0845    PT Stop Time 0935    PT Time Calculation (min) 50 min    Activity Tolerance Patient tolerated treatment well    Behavior During Therapy [facility] for tasks assessed/performed             Past Medical History:  Diagnosis Date   Abdominal pain 10/18/2010   Anemia    Arthritis    Constipation 10/18/2010   Crohn's disease (HCC) 10/18/2010   SMALL BOWELS   Diarrhea 10/18/2010   Diverticulosis    Heart murmur    ECHO 2022   History of kidney stones    Kidney stone on left side    Past Surgical History:  Procedure Laterality Date   BIOPSY  06/02/2020   Procedure: BIOPSY;  Surgeon: Malissa Hippo, MD;  [facility];  Service: Endoscopy;;   BREAST SURGERY Right    [id number] Lumpectomy   COLONOSCOPY  10/16/2001   COLONOSCOPY  03/15/2012   Procedure: COLONOSCOPY;  Surgeon: Malissa Hippo, MD;  [facility];  Service: Endoscopy;  Laterality: N/A;  730   COLONOSCOPY WITH PROPOFOL N/A 06/02/2020   Procedure: COLONOSCOPY WITH PROPOFOL;  Surgeon: Malissa Hippo, MD;  [facility];  Service: Endoscopy;  Laterality: N/A;  730   COLONOSCOPY WITH PROPOFOL N/A 07/21/2022   Procedure: COLONOSCOPY WITH PROPOFOL;  Surgeon: Dolores Frame, MD;  [facility];  Service: Gastroenterology;  Laterality: N/A;  9:45am, asa 1-2   CYSTOSCOPY W/ URETERAL STENT PLACEMENT Left 10/31/2022   Procedure: CYSTOSCOPY WITH RETROGRADE PYELOGRAM/URETERAL STENT PLACEMENT;  Surgeon: Sebastian Ache, MD;  [facility];  Service: Urology;  Laterality: Left;   CYSTOSCOPY WITH RETROGRADE PYELOGRAM, URETEROSCOPY AND STENT PLACEMENT Left 11/15/2022    Procedure: CYSTOSCOPY WITH RETROGRADE PYELOGRAM, URETEROSCOPY AND STENT PLACEMENT, BASKET STONE REMOVAL;  Surgeon: Loletta Parish., MD;  [facility];  Service: Urology;  Laterality: Left;  75 MINS   DILATION AND CURETTAGE OF UTERUS  09/1999   Polyp resection   ESOPHAGEAL DILATION N/A 06/02/2020   Procedure: ESOPHAGEAL DILATION;  Surgeon: Malissa Hippo, MD;  [facility];  Service: Endoscopy;  Laterality: N/A;   ESOPHAGOGASTRODUODENOSCOPY (EGD) WITH PROPOFOL N/A 06/02/2020   Procedure: ESOPHAGOGASTRODUODENOSCOPY (EGD) WITH PROPOFOL;  Surgeon: Malissa Hippo, MD;  [facility];  Service: Endoscopy;  Laterality: N/A;   HEMOSTASIS CLIP PLACEMENT  07/21/2022   Procedure: HEMOSTASIS CLIP PLACEMENT;  Surgeon: Dolores Frame, MD;  [facility];  Service: Gastroenterology;;   HOLMIUM LASER APPLICATION Left 11/15/2022   Procedure: HOLMIUM LASER APPLICATION;  Surgeon: Loletta Parish., MD;  [facility];  Service: Urology;  Laterality: Left;   LAPAROSCOPIC CHOLECYSTECTOMY  12/17/1996   DEMASON   POLYPECTOMY  07/21/2022   Procedure: POLYPECTOMY INTESTINAL;  Surgeon: Dolores Frame, MD;  [facility];  Service: Gastroenterology;;   Patient Active Problem List   Diagnosis Date Noted   History of hypokalemia 04/04/2023   Abnormal complete blood count 04/04/2023   Hydronephrosis concurrent with and due to calculi of kidney and ureter 10/31/2022   Hypokalemia  10/31/2022   AKI (acute kidney injury) (HCC) 10/31/2022   Burning sensation of mouth 01/05/2022   Crohn's disease, small intestine (HCC) 10/31/2021   Osteopenia 10/26/2020   Crohn's disease of both small and large intestine (HCC) 04/27/2020   History of colonic polyps 10/21/2019   Abdominal pain, epigastric 02/17/2013   Nausea 02/17/2013   GERD (gastroesophageal reflux disease) 08/22/2011   Fracture of rib of right side 08/22/2011   Constipation  10/18/2010    PCP: Richardean Chimera, MD  REFERRING PROVIDER: Adam Phenix, DPM  REFERRING DIAG: Recurrent left heel pain  THERAPY DIAG:  Pain in left foot  Rationale for Evaluation and Treatment: Rehabilitation  ONSET DATE: June 2024  SUBJECTIVE:   SUBJECTIVE STATEMENT: Pain about a 5. PERTINENT HISTORY: Osteopenia, Crohn's disease, and arthritis PAIN:  Are you having pain? Yes: NPRS scale: Current: 2-3/10 Best: 0/10 Worst: 2-3/10 Pain location: left heel Pain description: intermittent burning Aggravating factors: getting up in the morning, standing, walking  Relieving factors: injections  PRECAUTIONS: None  RED FLAGS: None   WEIGHT BEARING RESTRICTIONS: No  FALLS:  Has patient fallen in last 6 months? No  LIVING ENVIRONMENT: Lives with: lives alone Lives in: House/apartment Stairs: No Has following equipment at home: None  OCCUPATION: part-time in education, but primarily sitting  PLOF: Independent  PATIENT GOALS: reduced pain, be able to go back to the gym (2-3 times per week prior to injury, but none current), improved balance and safety going down steps, and be able to walk longer  NEXT MD VISIT: summer 2025  OBJECTIVE:  Note: Objective measures were completed at Evaluation unless otherwise noted.  COGNITION: Overall cognitive status: Within functional limits for tasks assessed     SENSATION: Patient reports no numbness or tingling  PALPATION: TTP: left calcaneus   LOWER EXTREMITY ROM:  Active ROM Right eval Left eval  Hip flexion    Hip extension    Hip abduction    Hip adduction    Hip internal rotation    Hip external rotation    Knee flexion    Knee extension    Ankle dorsiflexion -5 -10; "pulling" in calf and heel   Ankle plantarflexion 40 45  Ankle inversion 30 30  Ankle eversion 27 25   (Blank rows = not tested)  LOWER EXTREMITY MMT:  MMT Right eval Left eval  Hip flexion    Hip extension    Hip abduction    Hip  adduction    Hip internal rotation    Hip external rotation    Knee flexion    Knee extension    Ankle dorsiflexion 4+/5 5/5  Ankle plantarflexion    Ankle inversion 5/5 5/5  Ankle eversion 4/5 4/5   (Blank rows = not tested)  GAIT: Assistive device utilized: None Level of assistance: Complete Independence Comments: No significant gait deviations observed  TREATMENT DATE:  09/25/23:  Small soundhead combo e'stim/US at 1.50 W/CM2 x 12 minutes to patient's affected left heel f/b STW/M x 11 minutes f/b HMP and IFC at 80-150 Hz on 40% scan x 20 minutes.  Normal modality response following removal of modality.     PATIENT EDUCATION:  Education details: Plan of care, prognosis, healing, objective findings, and goals for physical therapy Person educated: Patient Education method: Explanation Education comprehension: verbalized understanding  HOME EXERCISE PROGRAM:   ASSESSMENT:  CLINICAL IMPRESSION:  Patient very tender to palpation over her left calcaneus.  She did well with treatment today.    OBJECTIVE IMPAIRMENTS: decreased activity tolerance, decreased mobility, difficulty walking, decreased ROM, and pain.   ACTIVITY LIMITATIONS: standing and locomotion level  PARTICIPATION LIMITATIONS: shopping and community activity  PERSONAL FACTORS: Age, Past/current experiences, Time since onset of injury/illness/exacerbation, and 3+ comorbidities: Osteopenia, Crohn's disease, and arthritis  are also affecting patient's functional outcome.   `1 REHAB POTENTIAL: Good  CLINICAL DECISION MAKING: Stable/uncomplicated  EVALUATION COMPLEXITY: Low   GOALS: Goals reviewed with patient? Yes  LONG TERM GOALS: Target date: 10/18/23  Patient will be independent with her HEP. Baseline:  Goal status: INITIAL  2.  Patient will improve her left ankle dorsiflexion  to within 5 degrees of neutral for improved ankle mobility. Baseline:  Goal status: INITIAL  3.  Patient will be able to complete her daily activities without her familiar pain exceeding 1/10. Baseline:  Goal status: INITIAL  4.  Patient will be able to return to her normal gym activities without being limited by her familiar left foot symptoms. Baseline:  Goal status: INITIAL  PLAN:  PT FREQUENCY: 2x/week  PT DURATION: 4 weeks  PLANNED INTERVENTIONS: 97164- PT Re-evaluation, 97110-Therapeutic exercises, 97530- Therapeutic activity, [id number]- Neuromuscular re-education, 97535- Self Care, 16109- Manual therapy, G0283- Electrical stimulation (unattended), 97016- Vasopneumatic device, Patient/Family education, Balance training, Stair training, Joint mobilization, Cryotherapy, and Moist heat  PLAN FOR NEXT SESSION: NuStep, manual therapy, rocker board, and modalities as needed   Nitzia Perren, [place], PT 09/25/2023, 10:12 AM

## 2023-09-28 ENCOUNTER — Ambulatory Visit: Payer: Medicare Other | Admitting: Physical Therapy

## 2023-09-28 DIAGNOSIS — M79672 Pain in left foot: Secondary | ICD-10-CM

## 2023-09-28 NOTE — Therapy (Signed)
 OUTPATIENT PHYSICAL THERAPY LOWER EXTREMITY TREATMENT   Patient Name: Gail Henry MRN: [id number] DOB:05-Mar-1946, 78 y.o., female Today's Date: 09/28/2023  END OF SESSION:  PT End of Session - 09/28/23 1010     Visit Number 3    Number of Visits 8    Date for PT Re-Evaluation 11/23/23    PT Start Time 0845    PT Stop Time 0934    PT Time Calculation (min) 49 min    Activity Tolerance Patient tolerated treatment well    Behavior During Therapy [facility] for tasks assessed/performed             Past Medical History:  Diagnosis Date   Abdominal pain 10/18/2010   Anemia    Arthritis    Constipation 10/18/2010   Crohn's disease (HCC) 10/18/2010   SMALL BOWELS   Diarrhea 10/18/2010   Diverticulosis    Heart murmur    ECHO 2022   History of kidney stones    Kidney stone on left side    Past Surgical History:  Procedure Laterality Date   BIOPSY  06/02/2020   Procedure: BIOPSY;  Surgeon: Malissa Hippo, MD;  [facility];  Service: Endoscopy;;   BREAST SURGERY Right    [id number] Lumpectomy   COLONOSCOPY  10/16/2001   COLONOSCOPY  03/15/2012   Procedure: COLONOSCOPY;  Surgeon: Malissa Hippo, MD;  [facility];  Service: Endoscopy;  Laterality: N/A;  730   COLONOSCOPY WITH PROPOFOL N/A 06/02/2020   Procedure: COLONOSCOPY WITH PROPOFOL;  Surgeon: Malissa Hippo, MD;  [facility];  Service: Endoscopy;  Laterality: N/A;  730   COLONOSCOPY WITH PROPOFOL N/A 07/21/2022   Procedure: COLONOSCOPY WITH PROPOFOL;  Surgeon: Dolores Frame, MD;  [facility];  Service: Gastroenterology;  Laterality: N/A;  9:45am, asa 1-2   CYSTOSCOPY W/ URETERAL STENT PLACEMENT Left 10/31/2022   Procedure: CYSTOSCOPY WITH RETROGRADE PYELOGRAM/URETERAL STENT PLACEMENT;  Surgeon: Sebastian Ache, MD;  [facility];  Service: Urology;  Laterality: Left;   CYSTOSCOPY WITH RETROGRADE PYELOGRAM, URETEROSCOPY AND STENT PLACEMENT Left 11/15/2022    Procedure: CYSTOSCOPY WITH RETROGRADE PYELOGRAM, URETEROSCOPY AND STENT PLACEMENT, BASKET STONE REMOVAL;  Surgeon: Loletta Parish., MD;  [facility];  Service: Urology;  Laterality: Left;  75 MINS   DILATION AND CURETTAGE OF UTERUS  09/1999   Polyp resection   ESOPHAGEAL DILATION N/A 06/02/2020   Procedure: ESOPHAGEAL DILATION;  Surgeon: Malissa Hippo, MD;  [facility];  Service: Endoscopy;  Laterality: N/A;   ESOPHAGOGASTRODUODENOSCOPY (EGD) WITH PROPOFOL N/A 06/02/2020   Procedure: ESOPHAGOGASTRODUODENOSCOPY (EGD) WITH PROPOFOL;  Surgeon: Malissa Hippo, MD;  [facility];  Service: Endoscopy;  Laterality: N/A;   HEMOSTASIS CLIP PLACEMENT  07/21/2022   Procedure: HEMOSTASIS CLIP PLACEMENT;  Surgeon: Dolores Frame, MD;  [facility];  Service: Gastroenterology;;   HOLMIUM LASER APPLICATION Left 11/15/2022   Procedure: HOLMIUM LASER APPLICATION;  Surgeon: Loletta Parish., MD;  [facility];  Service: Urology;  Laterality: Left;   LAPAROSCOPIC CHOLECYSTECTOMY  12/17/1996   DEMASON   POLYPECTOMY  07/21/2022   Procedure: POLYPECTOMY INTESTINAL;  Surgeon: Dolores Frame, MD;  [facility];  Service: Gastroenterology;;   Patient Active Problem List   Diagnosis Date Noted   History of hypokalemia 04/04/2023   Abnormal complete blood count 04/04/2023   Hydronephrosis concurrent with and due to calculi of kidney and ureter 10/31/2022   Hypokalemia  10/31/2022   AKI (acute kidney injury) (HCC) 10/31/2022   Burning sensation of mouth 01/05/2022   Crohn's disease, small intestine (HCC) 10/31/2021   Osteopenia 10/26/2020   Crohn's disease of both small and large intestine (HCC) 04/27/2020   History of colonic polyps 10/21/2019   Abdominal pain, epigastric 02/17/2013   Nausea 02/17/2013   GERD (gastroesophageal reflux disease) 08/22/2011   Fracture of rib of right side 08/22/2011   Constipation  10/18/2010    PCP: Richardean Chimera, MD  REFERRING PROVIDER: Adam Phenix, DPM  REFERRING DIAG: Recurrent left heel pain  THERAPY DIAG:  Pain in left foot  Rationale for Evaluation and Treatment: Rehabilitation  ONSET DATE: June 2024  SUBJECTIVE:   SUBJECTIVE STATEMENT: Sore from last treatment.  PERTINENT HISTORY: Osteopenia, Crohn's disease, and arthritis PAIN:  Are you having pain? Yes: NPRS scale: Current: 2-3/10 Best: 0/10 Worst: 2-3/10 Pain location: left heel Pain description: intermittent burning Aggravating factors: getting up in the morning, standing, walking  Relieving factors: injections  PRECAUTIONS: None  RED FLAGS: None   WEIGHT BEARING RESTRICTIONS: No  FALLS:  Has patient fallen in last 6 months? No  LIVING ENVIRONMENT: Lives with: lives alone Lives in: House/apartment Stairs: No Has following equipment at home: None  OCCUPATION: part-time in education, but primarily sitting  PLOF: Independent  PATIENT GOALS: reduced pain, be able to go back to the gym (2-3 times per week prior to injury, but none current), improved balance and safety going down steps, and be able to walk longer  NEXT MD VISIT: summer 2025  OBJECTIVE:  Note: Objective measures were completed at Evaluation unless otherwise noted.  COGNITION: Overall cognitive status: Within functional limits for tasks assessed     SENSATION: Patient reports no numbness or tingling  PALPATION: TTP: left calcaneus   LOWER EXTREMITY ROM:  Active ROM Right eval Left eval  Hip flexion    Hip extension    Hip abduction    Hip adduction    Hip internal rotation    Hip external rotation    Knee flexion    Knee extension    Ankle dorsiflexion -5 -10; "pulling" in calf and heel   Ankle plantarflexion 40 45  Ankle inversion 30 30  Ankle eversion 27 25   (Blank rows = not tested)  LOWER EXTREMITY MMT:  MMT Right eval Left eval  Hip flexion    Hip extension    Hip abduction     Hip adduction    Hip internal rotation    Hip external rotation    Knee flexion    Knee extension    Ankle dorsiflexion 4+/5 5/5  Ankle plantarflexion    Ankle inversion 5/5 5/5  Ankle eversion 4/5 4/5   (Blank rows = not tested)  GAIT: Assistive device utilized: None Level of assistance: Complete Independence Comments: No significant gait deviations observed  TREATMENT DATE:   09/28/23:  Nustep level 3 x 6 minutes f/b Rockerboard x 5 minutes f/b STW/M including IASTM x 13 minutes f/b f/b HMP and Pre-mod e stim x 20 minutes.  Normal modality response following removal of modality.      09/25/23:  Small soundhead combo e'stim/US at 1.50 W/CM2 x 12 minutes to patient's affected left heel f/b STW/M x 11 minutes f/b HMP and IFC at 80-150 Hz on 40% scan x 20 minutes.  Normal modality response following removal of modality.     PATIENT EDUCATION:  Education details: See below. Person educated: Patient Education method: Explanation Education comprehension: verbalized understanding, handout.  HOME EXERCISE PROGRAM: HOME EXERCISE PROGRAM Created by [place] Zakari Bathe Feb 28th, 2025 View at [email or web address] using code: [email or web address]  Page 1 of 1 2 Exercises STANDING CALF STRETCH - SOLEUS Start by standing in front of a wall or other sturdy object. Step forward with one foot and maintain your toes on both feet to be pointed straight forward. Keep the leg behind you with a bent knee during the stretch.  Lean forward towards the wall and support yourself with your arms as you allow your front knee to bend until a gentle stretch is felt along the back of your leg that is most behind you.  Move closer or further away from the wall to control the stretch of the back leg. Also you can adjust the bend of the front knee to control the stretch as well. Repeat 2  Times Hold 1 Minute Complete 2 Sets Perform 3 Times a Day STANDING CALF STRETCH - GASTROCNEMIUS Start by standing in front of a wall or other sturdy object. Step forward with one foot and maintain your toes on both feet to be pointed straight forward. Keep the leg behind you with a straight knee during the stretch.  Lean forward towards the wall and support yourself with your arms as you allow your front knee to bend until a gentle stretch is felt along the back of your leg that is most behind you.  Move closer or further away from the wall to control the stretch of the back leg. Also you can adjust the bend of the front knee to control the stretch as well. Repeat 2 Times Hold 1 Minute Complete 1 Set Perform 3 Times a Day  ASSESSMENT:  CLINICAL IMPRESSION:  Patient sore from last treatment.  She did well with treatment today which included IASTM.  HEP provided with handout/pictures.     OBJECTIVE IMPAIRMENTS: decreased activity tolerance, decreased mobility, difficulty walking, decreased ROM, and pain.   ACTIVITY LIMITATIONS: standing and locomotion level  PARTICIPATION LIMITATIONS: shopping and community activity  PERSONAL FACTORS: Age, Past/current experiences, Time since onset of injury/illness/exacerbation, and 3+ comorbidities: Osteopenia, Crohn's disease, and arthritis  are also affecting patient's functional outcome.   `1 REHAB POTENTIAL: Good  CLINICAL DECISION MAKING: Stable/uncomplicated  EVALUATION COMPLEXITY: Low   GOALS: Goals reviewed with patient? Yes  LONG TERM GOALS: Target date: 10/18/23  Patient will be independent with her HEP. Baseline:  Goal status: INITIAL  2.  Patient will improve her left ankle dorsiflexion to within 5 degrees of neutral for improved ankle mobility. Baseline:  Goal status: INITIAL  3.  Patient will be able to complete her daily activities without her familiar pain exceeding 1/10. Baseline:  Goal status: INITIAL  4.  Patient  will be able to return to her normal gym activities without being limited by her familiar left foot symptoms. Baseline:  Goal status: INITIAL  PLAN:  PT FREQUENCY: 2x/week  PT DURATION: 4 weeks  PLANNED INTERVENTIONS: 97164- PT Re-evaluation, 97110-Therapeutic exercises, 97530- Therapeutic activity, [id number]- Neuromuscular re-education, 97535- Self Care, 16109- Manual therapy, G0283- Electrical stimulation (unattended), 97016- Vasopneumatic device, Patient/Family education, Balance training, Stair training, Joint mobilization, Cryotherapy, and Moist heat  PLAN FOR NEXT SESSION: NuStep, manual therapy, rocker board, and modalities as needed   Constantina Laseter, [place], PT 09/28/2023, 10:20 AM

## 2023-10-02 ENCOUNTER — Ambulatory Visit: Payer: Medicare Other | Attending: Podiatry | Admitting: Physical Therapy

## 2023-10-02 DIAGNOSIS — M79672 Pain in left foot: Secondary | ICD-10-CM | POA: Diagnosis present

## 2023-10-02 NOTE — Therapy (Signed)
 OUTPATIENT PHYSICAL THERAPY LOWER EXTREMITY TREATMENT   Patient Name: Gail Henry MRN: [id number] DOB:May 14, 1946, 78 y.o., female Today's Date: 10/02/2023  END OF SESSION:  PT End of Session - 10/02/23 1007     Visit Number 4    Number of Visits 8    Date for PT Re-Evaluation 11/23/23    PT Start Time 0930    PT Stop Time 1019    PT Time Calculation (min) 49 min    Activity Tolerance Patient tolerated treatment well    Behavior During Therapy [facility] for tasks assessed/performed             Past Medical History:  Diagnosis Date   Abdominal pain 10/18/2010   Anemia    Arthritis    Constipation 10/18/2010   Crohn's disease (HCC) 10/18/2010   SMALL BOWELS   Diarrhea 10/18/2010   Diverticulosis    Heart murmur    ECHO 2022   History of kidney stones    Kidney stone on left side    Past Surgical History:  Procedure Laterality Date   BIOPSY  06/02/2020   Procedure: BIOPSY;  Surgeon: Malissa Hippo, MD;  [facility];  Service: Endoscopy;;   BREAST SURGERY Right    4010,2725 Lumpectomy   COLONOSCOPY  10/16/2001   COLONOSCOPY  03/15/2012   Procedure: COLONOSCOPY;  Surgeon: Malissa Hippo, MD;  [facility];  Service: Endoscopy;  Laterality: N/A;  730   COLONOSCOPY WITH PROPOFOL N/A 06/02/2020   Procedure: COLONOSCOPY WITH PROPOFOL;  Surgeon: Malissa Hippo, MD;  [facility];  Service: Endoscopy;  Laterality: N/A;  730   COLONOSCOPY WITH PROPOFOL N/A 07/21/2022   Procedure: COLONOSCOPY WITH PROPOFOL;  Surgeon: Dolores Frame, MD;  [facility];  Service: Gastroenterology;  Laterality: N/A;  9:45am, asa 1-2   CYSTOSCOPY W/ URETERAL STENT PLACEMENT Left 10/31/2022   Procedure: CYSTOSCOPY WITH RETROGRADE PYELOGRAM/URETERAL STENT PLACEMENT;  Surgeon: Sebastian Ache, MD;  [facility]: [facility];  Service: Urology;  Laterality: Left;   CYSTOSCOPY WITH RETROGRADE PYELOGRAM, URETEROSCOPY AND STENT PLACEMENT Left 11/15/2022    Procedure: CYSTOSCOPY WITH RETROGRADE PYELOGRAM, URETEROSCOPY AND STENT PLACEMENT, BASKET STONE REMOVAL;  Surgeon: Loletta Parish., MD;  [facility]: [facility];  Service: Urology;  Laterality: Left;  75 MINS   DILATION AND CURETTAGE OF UTERUS  09/1999   Polyp resection   ESOPHAGEAL DILATION N/A 06/02/2020   Procedure: ESOPHAGEAL DILATION;  Surgeon: Malissa Hippo, MD;  [facility];  Service: Endoscopy;  Laterality: N/A;   ESOPHAGOGASTRODUODENOSCOPY (EGD) WITH PROPOFOL N/A 06/02/2020   Procedure: ESOPHAGOGASTRODUODENOSCOPY (EGD) WITH PROPOFOL;  Surgeon: Malissa Hippo, MD;  [facility];  Service: Endoscopy;  Laterality: N/A;   HEMOSTASIS CLIP PLACEMENT  07/21/2022   Procedure: HEMOSTASIS CLIP PLACEMENT;  Surgeon: Dolores Frame, MD;  [facility];  Service: Gastroenterology;;   HOLMIUM LASER APPLICATION Left 11/15/2022   Procedure: HOLMIUM LASER APPLICATION;  Surgeon: Loletta Parish., MD;  [facility];  Service: Urology;  Laterality: Left;   LAPAROSCOPIC CHOLECYSTECTOMY  12/17/1996   DEMASON   POLYPECTOMY  07/21/2022   Procedure: POLYPECTOMY INTESTINAL;  Surgeon: Dolores Frame, MD;  [facility];  Service: Gastroenterology;;   Patient Active Problem List   Diagnosis Date Noted   History of hypokalemia 04/04/2023   Abnormal complete blood count 04/04/2023   Hydronephrosis concurrent with and due to calculi of kidney and ureter 10/31/2022   Hypokalemia  10/31/2022   AKI (acute kidney injury) (HCC) 10/31/2022   Burning sensation of mouth 01/05/2022   Crohn's disease, small intestine (HCC) 10/31/2021   Osteopenia 10/26/2020   Crohn's disease of both small and large intestine (HCC) 04/27/2020   History of colonic polyps 10/21/2019   Abdominal pain, epigastric 02/17/2013   Nausea 02/17/2013   GERD (gastroesophageal reflux disease) 08/22/2011   Fracture of rib of right side 08/22/2011   Constipation  10/18/2010    PCP: Richardean Chimera, MD  REFERRING PROVIDER: Adam Phenix, DPM  REFERRING DIAG: Recurrent left heel pain  THERAPY DIAG:  Pain in left foot  Rationale for Evaluation and Treatment: Rehabilitation  ONSET DATE: June 2024  SUBJECTIVE:   SUBJECTIVE STATEMENT: Sore from last treatment.  PERTINENT HISTORY: Osteopenia, Crohn's disease, and arthritis PAIN:  Are you having pain? Yes: NPRS scale: Current: 2-3/10 Best: 0/10 Worst: 2-3/10 Pain location: left heel Pain description: intermittent burning Aggravating factors: getting up in the morning, standing, walking  Relieving factors: injections  PRECAUTIONS: None  RED FLAGS: None   WEIGHT BEARING RESTRICTIONS: No  FALLS:  Has patient fallen in last 6 months? No  LIVING ENVIRONMENT: Lives with: lives alone Lives in: House/apartment Stairs: No Has following equipment at home: None  OCCUPATION: part-time in education, but primarily sitting  PLOF: Independent  PATIENT GOALS: reduced pain, be able to go back to the gym (2-3 times per week prior to injury, but none current), improved balance and safety going down steps, and be able to walk longer  NEXT MD VISIT: summer 2025  OBJECTIVE:  Note: Objective measures were completed at Evaluation unless otherwise noted.  COGNITION: Overall cognitive status: Within functional limits for tasks assessed     SENSATION: Patient reports no numbness or tingling  PALPATION: TTP: left calcaneus   LOWER EXTREMITY ROM:  Active ROM Right eval Left eval  Hip flexion    Hip extension    Hip abduction    Hip adduction    Hip internal rotation    Hip external rotation    Knee flexion    Knee extension    Ankle dorsiflexion -5 -10; "pulling" in calf and heel   Ankle plantarflexion 40 45  Ankle inversion 30 30  Ankle eversion 27 25   (Blank rows = not tested)  LOWER EXTREMITY MMT:  MMT Right eval Left eval  Hip flexion    Hip extension    Hip abduction     Hip adduction    Hip internal rotation    Hip external rotation    Knee flexion    Knee extension    Ankle dorsiflexion 4+/5 5/5  Ankle plantarflexion    Ankle inversion 5/5 5/5  Ankle eversion 4/5 4/5   (Blank rows = not tested)  GAIT: Assistive device utilized: None Level of assistance: Complete Independence Comments: No significant gait deviations observed  TREATMENT DATE:   10/02/23:  Nustep level 3 x 10 minutes  f/b STW/M including IASTM x 13 minutes f/b f/b HMP and Pre-mod e stim x 20 minutes.  Normal modality response following removal of modality.    09/28/23:  Nustep level 3 x 6 minutes f/b Rockerboard x 5 minutes f/b STW/M including IASTM x 13 minutes f/b f/b HMP and Pre-mod e stim x 20 minutes.  Normal modality response following removal of modality.      09/25/23:  Small soundhead combo e'stim/US at 1.50 W/CM2 x 12 minutes to patient's affected left heel f/b STW/M x 11 minutes f/b HMP and IFC at 80-150 Hz on 40% scan x 20 minutes.  Normal modality response following removal of modality.     PATIENT EDUCATION:  Education details: See below. Person educated: Patient Education method: Explanation Education comprehension: verbalized understanding, handout.  HOME EXERCISE PROGRAM: HOME EXERCISE PROGRAM Created by [place] Ada Woodbury Feb 28th, 2025 View at [email or web address] using code: [email or web address]  Page 1 of 1 2 Exercises STANDING CALF STRETCH - SOLEUS Start by standing in front of a wall or other sturdy object. Step forward with one foot and maintain your toes on both feet to be pointed straight forward. Keep the leg behind you with a bent knee during the stretch.  Lean forward towards the wall and support yourself with your arms as you allow your front knee to bend until a gentle stretch is felt along the back of your leg that is most  behind you.  Move closer or further away from the wall to control the stretch of the back leg. Also you can adjust the bend of the front knee to control the stretch as well. Repeat 2 Times Hold 1 Minute Complete 2 Sets Perform 3 Times a Day STANDING CALF STRETCH - GASTROCNEMIUS Start by standing in front of a wall or other sturdy object. Step forward with one foot and maintain your toes on both feet to be pointed straight forward. Keep the leg behind you with a straight knee during the stretch.  Lean forward towards the wall and support yourself with your arms as you allow your front knee to bend until a gentle stretch is felt along the back of your leg that is most behind you.  Move closer or further away from the wall to control the stretch of the back leg. Also you can adjust the bend of the front knee to control the stretch as well. Repeat 2 Times Hold 1 Minute Complete 1 Set Perform 3 Times a Day  ASSESSMENT:  CLINICAL IMPRESSION:  Patient sore from last treatment.  She did well with treatment today which included IASTM.    OBJECTIVE IMPAIRMENTS: decreased activity tolerance, decreased mobility, difficulty walking, decreased ROM, and pain.   ACTIVITY LIMITATIONS: standing and locomotion level  PARTICIPATION LIMITATIONS: shopping and community activity  PERSONAL FACTORS: Age, Past/current experiences, Time since onset of injury/illness/exacerbation, and 3+ comorbidities: Osteopenia, Crohn's disease, and arthritis  are also affecting patient's functional outcome.   `1 REHAB POTENTIAL: Good  CLINICAL DECISION MAKING: Stable/uncomplicated  EVALUATION COMPLEXITY: Low   GOALS: Goals reviewed with patient? Yes  LONG TERM GOALS: Target date: 10/18/23  Patient will be independent with her HEP. Baseline:  Goal status: INITIAL  2.  Patient will improve her left ankle dorsiflexion to within 5 degrees of neutral for improved ankle mobility. Baseline:  Goal status:  INITIAL  3.  Patient will be able to complete her daily activities without her familiar pain  exceeding 1/10. Baseline:  Goal status: INITIAL  4.  Patient will be able to return to her normal gym activities without being limited by her familiar left foot symptoms. Baseline:  Goal status: INITIAL  PLAN:  PT FREQUENCY: 2x/week  PT DURATION: 4 weeks  PLANNED INTERVENTIONS: 97164- PT Re-evaluation, 97110-Therapeutic exercises, 97530- Therapeutic activity, 97112- Neuromuscular re-education, 97535- Self Care, 56213- Manual therapy, G0283- Electrical stimulation (unattended), 97016- Vasopneumatic device, Patient/Family education, Balance training, Stair training, Joint mobilization, Cryotherapy, and Moist heat  PLAN FOR NEXT SESSION: NuStep, manual therapy, rocker board, and modalities as needed   Shawnita Krizek, [place], PT 10/02/2023, 10:39 AM

## 2023-10-05 ENCOUNTER — Ambulatory Visit: Payer: Medicare Other | Admitting: Physical Therapy

## 2023-10-05 DIAGNOSIS — M79672 Pain in left foot: Secondary | ICD-10-CM

## 2023-10-05 NOTE — Therapy (Signed)
 OUTPATIENT PHYSICAL THERAPY LOWER EXTREMITY TREATMENT   Patient Name: Gail Henry MRN: [id number] DOB:12/29/1945, 78 y.o., female Today's Date: 10/05/2023  END OF SESSION:  PT End of Session - 10/05/23 1012     Visit Number 5    Number of Visits 8    Date for PT Re-Evaluation 11/23/23    PT Start Time 0930    PT Stop Time 1023    PT Time Calculation (min) 53 min    Activity Tolerance Patient tolerated treatment well    Behavior During Therapy [facility] for tasks assessed/performed             Past Medical History:  Diagnosis Date   Abdominal pain 10/18/2010   Anemia    Arthritis    Constipation 10/18/2010   Crohn's disease (HCC) 10/18/2010   SMALL BOWELS   Diarrhea 10/18/2010   Diverticulosis    Heart murmur    ECHO 2022   History of kidney stones    Kidney stone on left side    Past Surgical History:  Procedure Laterality Date   BIOPSY  06/02/2020   Procedure: BIOPSY;  Surgeon: Malissa Hippo, MD;  [facility];  Service: Endoscopy;;   BREAST SURGERY Right    7829,5621 Lumpectomy   COLONOSCOPY  10/16/2001   COLONOSCOPY  03/15/2012   Procedure: COLONOSCOPY;  Surgeon: Malissa Hippo, MD;  [facility];  Service: Endoscopy;  Laterality: N/A;  730   COLONOSCOPY WITH PROPOFOL N/A 06/02/2020   Procedure: COLONOSCOPY WITH PROPOFOL;  Surgeon: Malissa Hippo, MD;  [facility];  Service: Endoscopy;  Laterality: N/A;  730   COLONOSCOPY WITH PROPOFOL N/A 07/21/2022   Procedure: COLONOSCOPY WITH PROPOFOL;  Surgeon: Dolores Frame, MD;  [facility];  Service: Gastroenterology;  Laterality: N/A;  9:45am, asa 1-2   CYSTOSCOPY W/ URETERAL STENT PLACEMENT Left 10/31/2022   Procedure: CYSTOSCOPY WITH RETROGRADE PYELOGRAM/URETERAL STENT PLACEMENT;  Surgeon: Sebastian Ache, MD;  [facility];  Service: Urology;  Laterality: Left;   CYSTOSCOPY WITH RETROGRADE PYELOGRAM, URETEROSCOPY AND STENT PLACEMENT Left 11/15/2022    Procedure: CYSTOSCOPY WITH RETROGRADE PYELOGRAM, URETEROSCOPY AND STENT PLACEMENT, BASKET STONE REMOVAL;  Surgeon: Loletta Parish., MD;  [facility];  Service: Urology;  Laterality: Left;  75 MINS   DILATION AND CURETTAGE OF UTERUS  09/1999   Polyp resection   ESOPHAGEAL DILATION N/A 06/02/2020   Procedure: ESOPHAGEAL DILATION;  Surgeon: Malissa Hippo, MD;  [facility];  Service: Endoscopy;  Laterality: N/A;   ESOPHAGOGASTRODUODENOSCOPY (EGD) WITH PROPOFOL N/A 06/02/2020   Procedure: ESOPHAGOGASTRODUODENOSCOPY (EGD) WITH PROPOFOL;  Surgeon: Malissa Hippo, MD;  [facility];  Service: Endoscopy;  Laterality: N/A;   HEMOSTASIS CLIP PLACEMENT  07/21/2022   Procedure: HEMOSTASIS CLIP PLACEMENT;  Surgeon: Dolores Frame, MD;  [facility];  Service: Gastroenterology;;   HOLMIUM LASER APPLICATION Left 11/15/2022   Procedure: HOLMIUM LASER APPLICATION;  Surgeon: Loletta Parish., MD;  [facility];  Service: Urology;  Laterality: Left;   LAPAROSCOPIC CHOLECYSTECTOMY  12/17/1996   DEMASON   POLYPECTOMY  07/21/2022   Procedure: POLYPECTOMY INTESTINAL;  Surgeon: Dolores Frame, MD;  [facility];  Service: Gastroenterology;;   Patient Active Problem List   Diagnosis Date Noted   History of hypokalemia 04/04/2023   Abnormal complete blood count 04/04/2023   Hydronephrosis concurrent with and due to calculi of kidney and ureter 10/31/2022   Hypokalemia  10/31/2022   AKI (acute kidney injury) (HCC) 10/31/2022   Burning sensation of mouth 01/05/2022   Crohn's disease, small intestine (HCC) 10/31/2021   Osteopenia 10/26/2020   Crohn's disease of both small and large intestine (HCC) 04/27/2020   History of colonic polyps 10/21/2019   Abdominal pain, epigastric 02/17/2013   Nausea 02/17/2013   GERD (gastroesophageal reflux disease) 08/22/2011   Fracture of rib of right side 08/22/2011   Constipation  10/18/2010    PCP: Richardean Chimera, MD  REFERRING PROVIDER: Adam Phenix, DPM  REFERRING DIAG: Recurrent left heel pain  THERAPY DIAG:  Pain in left foot  Rationale for Evaluation and Treatment: Rehabilitation  ONSET DATE: June 2024  SUBJECTIVE:   SUBJECTIVE STATEMENT: Pain at a 3-4/10.  PERTINENT HISTORY: Osteopenia, Crohn's disease, and arthritis PAIN:  Are you having pain? Yes.  As above.  PRECAUTIONS: None  RED FLAGS: None   WEIGHT BEARING RESTRICTIONS: No  FALLS:  Has patient fallen in last 6 months? No  LIVING ENVIRONMENT: Lives with: lives alone Lives in: House/apartment Stairs: No Has following equipment at home: None  OCCUPATION: part-time in education, but primarily sitting  PLOF: Independent  PATIENT GOALS: reduced pain, be able to go back to the gym (2-3 times per week prior to injury, but none current), improved balance and safety going down steps, and be able to walk longer  NEXT MD VISIT: summer 2025  OBJECTIVE:  Note: Objective measures were completed at Evaluation unless otherwise noted.  COGNITION: Overall cognitive status: Within functional limits for tasks assessed     SENSATION: Patient reports no numbness or tingling  PALPATION: TTP: left calcaneus   LOWER EXTREMITY ROM:  Active ROM Right eval Left eval  Hip flexion    Hip extension    Hip abduction    Hip adduction    Hip internal rotation    Hip external rotation    Knee flexion    Knee extension    Ankle dorsiflexion -5 -10; "pulling" in calf and heel   Ankle plantarflexion 40 45  Ankle inversion 30 30  Ankle eversion 27 25   (Blank rows = not tested)  LOWER EXTREMITY MMT:  MMT Right eval Left eval  Hip flexion    Hip extension    Hip abduction    Hip adduction    Hip internal rotation    Hip external rotation    Knee flexion    Knee extension    Ankle dorsiflexion 4+/5 5/5  Ankle plantarflexion    Ankle inversion 5/5 5/5  Ankle eversion 4/5 4/5    (Blank rows = not tested)  GAIT: Assistive device utilized: None Level of assistance: Complete Independence Comments: No significant gait deviations observed                                                                                                                                TREATMENT DATE:   10/05/23:  Lora Paula  level 3 x 10 f/b Rockerboard x 3 minutes f/b slant board x 3 minutes f/b IASTM x 7 minutes f/b HMP and pre-mod e'stim x 20 minutes.    10/02/23:  Nustep level 3 x 10 minutes  f/b STW/M including IASTM x 13 minutes f/b f/b HMP and Pre-mod e stim x 20 minutes.  Normal modality response following removal of modality.       PATIENT EDUCATION:  Education details: See below. Person educated: Patient Education method: Explanation Education comprehension: verbalized understanding, handout.  HOME EXERCISE PROGRAM: HOME EXERCISE PROGRAM Created by [place] Juanisha Bautch Feb 28th, 2025 View at [email or web address] using code: [email or web address]  Page 1 of 1 2 Exercises STANDING CALF STRETCH - SOLEUS Start by standing in front of a wall or other sturdy object. Step forward with one foot and maintain your toes on both feet to be pointed straight forward. Keep the leg behind you with a bent knee during the stretch.  Lean forward towards the wall and support yourself with your arms as you allow your front knee to bend until a gentle stretch is felt along the back of your leg that is most behind you.  Move closer or further away from the wall to control the stretch of the back leg. Also you can adjust the bend of the front knee to control the stretch as well. Repeat 2 Times Hold 1 Minute Complete 2 Sets Perform 3 Times a Day STANDING CALF STRETCH - GASTROCNEMIUS Start by standing in front of a wall or other sturdy object. Step forward with one foot and maintain your toes on both feet to be pointed straight forward. Keep the leg behind you with a straight knee during the stretch.   Lean forward towards the wall and support yourself with your arms as you allow your front knee to bend until a gentle stretch is felt along the back of your leg that is most behind you.  Move closer or further away from the wall to control the stretch of the back leg. Also you can adjust the bend of the front knee to control the stretch as well. Repeat 2 Times Hold 1 Minute Complete 1 Set Perform 3 Times a Day  ASSESSMENT:  CLINICAL IMPRESSION:  Patient did well with rockerboard and slant board today.  Palpable  pain especially over left medial calcaneal tubercle.  OBJECTIVE IMPAIRMENTS: decreased activity tolerance, decreased mobility, difficulty walking, decreased ROM, and pain.   ACTIVITY LIMITATIONS: standing and locomotion level  PARTICIPATION LIMITATIONS: shopping and community activity  PERSONAL FACTORS: Age, Past/current experiences, Time since onset of injury/illness/exacerbation, and 3+ comorbidities: Osteopenia, Crohn's disease, and arthritis  are also affecting patient's functional outcome.   `1 REHAB POTENTIAL: Good  CLINICAL DECISION MAKING: Stable/uncomplicated  EVALUATION COMPLEXITY: Low   GOALS: Goals reviewed with patient? Yes  LONG TERM GOALS: Target date: 10/18/23  Patient will be independent with her HEP. Baseline:  Goal status: INITIAL  2.  Patient will improve her left ankle dorsiflexion to within 5 degrees of neutral for improved ankle mobility. Baseline:  Goal status: INITIAL  3.  Patient will be able to complete her daily activities without her familiar pain exceeding 1/10. Baseline:  Goal status: INITIAL  4.  Patient will be able to return to her normal gym activities without being limited by her familiar left foot symptoms. Baseline:  Goal status: INITIAL  PLAN:  PT FREQUENCY: 2x/week  PT DURATION: 4 weeks  PLANNED INTERVENTIONS: 97164- PT Re-evaluation, 97110-Therapeutic exercises, 97530- Therapeutic activity, [id number]- Neuromuscular  re-education, [phone]- Self Care, 56213- Manual therapy, G0283- Electrical stimulation (unattended), [phone]- Vasopneumatic device, Patient/Family education, Balance training, Stair training, Joint mobilization, Cryotherapy, and Moist heat  PLAN FOR NEXT SESSION: NuStep, manual therapy, rocker board, and modalities as needed   Saraann Enneking, [place], PT 10/05/2023, 10:29 AM

## 2023-10-15 ENCOUNTER — Ambulatory Visit

## 2023-10-15 DIAGNOSIS — M79672 Pain in left foot: Secondary | ICD-10-CM

## 2023-10-15 NOTE — Therapy (Signed)
 OUTPATIENT PHYSICAL THERAPY LOWER EXTREMITY TREATMENT   Patient Name: Gail Henry MRN: [id number] DOB:1946-04-07, 78 y.o., female Today's Date: 10/15/2023  END OF SESSION:  PT End of Session - 10/15/23 0947     Visit Number 6    Number of Visits 8    Date for PT Re-Evaluation 11/23/23    PT Start Time 0930    PT Stop Time 1015    PT Time Calculation (min) 45 min    Activity Tolerance Patient tolerated treatment well    Behavior During Therapy [facility] for tasks assessed/performed              Past Medical History:  Diagnosis Date   Abdominal pain 10/18/2010   Anemia    Arthritis    Constipation 10/18/2010   Crohn's disease (HCC) 10/18/2010   SMALL BOWELS   Diarrhea 10/18/2010   Diverticulosis    Heart murmur    ECHO 2022   History of kidney stones    Kidney stone on left side    Past Surgical History:  Procedure Laterality Date   BIOPSY  06/02/2020   Procedure: BIOPSY;  Surgeon: Malissa Hippo, MD;  [facility];  Service: Endoscopy;;   BREAST SURGERY Right    4098,1191 Lumpectomy   COLONOSCOPY  10/16/2001   COLONOSCOPY  03/15/2012   Procedure: COLONOSCOPY;  Surgeon: Malissa Hippo, MD;  [facility];  Service: Endoscopy;  Laterality: N/A;  730   COLONOSCOPY WITH PROPOFOL N/A 06/02/2020   Procedure: COLONOSCOPY WITH PROPOFOL;  Surgeon: Malissa Hippo, MD;  [facility];  Service: Endoscopy;  Laterality: N/A;  730   COLONOSCOPY WITH PROPOFOL N/A 07/21/2022   Procedure: COLONOSCOPY WITH PROPOFOL;  Surgeon: Dolores Frame, MD;  [facility];  Service: Gastroenterology;  Laterality: N/A;  9:45am, asa 1-2   CYSTOSCOPY W/ URETERAL STENT PLACEMENT Left 10/31/2022   Procedure: CYSTOSCOPY WITH RETROGRADE PYELOGRAM/URETERAL STENT PLACEMENT;  Surgeon: Sebastian Ache, MD;  [facility]: [facility];  Service: Urology;  Laterality: Left;   CYSTOSCOPY WITH RETROGRADE PYELOGRAM, URETEROSCOPY AND STENT PLACEMENT Left  11/15/2022   Procedure: CYSTOSCOPY WITH RETROGRADE PYELOGRAM, URETEROSCOPY AND STENT PLACEMENT, BASKET STONE REMOVAL;  Surgeon: Loletta Parish., MD;  [facility];  Service: Urology;  Laterality: Left;  75 MINS   DILATION AND CURETTAGE OF UTERUS  09/1999   Polyp resection   ESOPHAGEAL DILATION N/A 06/02/2020   Procedure: ESOPHAGEAL DILATION;  Surgeon: Malissa Hippo, MD;  [facility];  Service: Endoscopy;  Laterality: N/A;   ESOPHAGOGASTRODUODENOSCOPY (EGD) WITH PROPOFOL N/A 06/02/2020   Procedure: ESOPHAGOGASTRODUODENOSCOPY (EGD) WITH PROPOFOL;  Surgeon: Malissa Hippo, MD;  [facility];  Service: Endoscopy;  Laterality: N/A;   HEMOSTASIS CLIP PLACEMENT  07/21/2022   Procedure: HEMOSTASIS CLIP PLACEMENT;  Surgeon: Dolores Frame, MD;  [facility];  Service: Gastroenterology;;   HOLMIUM LASER APPLICATION Left 11/15/2022   Procedure: HOLMIUM LASER APPLICATION;  Surgeon: Loletta Parish., MD;  [facility];  Service: Urology;  Laterality: Left;   LAPAROSCOPIC CHOLECYSTECTOMY  12/17/1996   DEMASON   POLYPECTOMY  07/21/2022   Procedure: POLYPECTOMY INTESTINAL;  Surgeon: Dolores Frame, MD;  [facility];  Service: Gastroenterology;;   Patient Active Problem List   Diagnosis Date Noted   History of hypokalemia 04/04/2023   Abnormal complete blood count 04/04/2023   Hydronephrosis concurrent with and due to calculi of kidney and ureter 10/31/2022  Hypokalemia 10/31/2022   AKI (acute kidney injury) (HCC) 10/31/2022   Burning sensation of mouth 01/05/2022   Crohn's disease, small intestine (HCC) 10/31/2021   Osteopenia 10/26/2020   Crohn's disease of both small and large intestine (HCC) 04/27/2020   History of colonic polyps 10/21/2019   Abdominal pain, epigastric 02/17/2013   Nausea 02/17/2013   GERD (gastroesophageal reflux disease) 08/22/2011   Fracture of rib of right side 08/22/2011   Constipation  10/18/2010    PCP: Richardean Chimera, MD  REFERRING PROVIDER: Adam Phenix, DPM  REFERRING DIAG: Recurrent left heel pain  THERAPY DIAG:  Pain in left foot  Rationale for Evaluation and Treatment: Rehabilitation  ONSET DATE: June 2024  SUBJECTIVE:   SUBJECTIVE STATEMENT: Patient reports that her heel is burning a little more today, but she was unable to come to therapy any last week. She thinks that her Chron's disease may be causing additional pain and inflammation.   PERTINENT HISTORY: Osteopenia, Crohn's disease, and arthritis PAIN:  Are you having pain? Yes.  5/10  PRECAUTIONS: None  RED FLAGS: None   WEIGHT BEARING RESTRICTIONS: No  FALLS:  Has patient fallen in last 6 months? No  LIVING ENVIRONMENT: Lives with: lives alone Lives in: House/apartment Stairs: No Has following equipment at home: None  OCCUPATION: part-time in education, but primarily sitting  PLOF: Independent  PATIENT GOALS: reduced pain, be able to go back to the gym (2-3 times per week prior to injury, but none current), improved balance and safety going down steps, and be able to walk longer  NEXT MD VISIT: summer 2025  OBJECTIVE:  Note: Objective measures were completed at Evaluation unless otherwise noted.  COGNITION: Overall cognitive status: Within functional limits for tasks assessed     SENSATION: Patient reports no numbness or tingling  PALPATION: TTP: left calcaneus   LOWER EXTREMITY ROM:  Active ROM Right eval Left eval  Hip flexion    Hip extension    Hip abduction    Hip adduction    Hip internal rotation    Hip external rotation    Knee flexion    Knee extension    Ankle dorsiflexion -5 -10; "pulling" in calf and heel   Ankle plantarflexion 40 45  Ankle inversion 30 30  Ankle eversion 27 25   (Blank rows = not tested)  LOWER EXTREMITY MMT:  MMT Right eval Left eval  Hip flexion    Hip extension    Hip abduction    Hip adduction    Hip internal  rotation    Hip external rotation    Knee flexion    Knee extension    Ankle dorsiflexion 4+/5 5/5  Ankle plantarflexion    Ankle inversion 5/5 5/5  Ankle eversion 4/5 4/5   (Blank rows = not tested)  GAIT: Assistive device utilized: None Level of assistance: Complete Independence Comments: No significant gait deviations observed  TREATMENT DATE:                                    10/15/23 EXERCISE LOG  Exercise Repetitions and Resistance Comments  Nustep  L3 x 11 minutes   Standing gastroc stretch  4 x 30 seconds   Standing heel raise  20 reps    Ankle circles 1 minute each  CW and CCW        Blank cell = exercise not performed today  Modalities: no redness or adverse reaction to today's modalities  Date:  Unattended Estim: left plantar fascia, pre mod @ 80-150 Hz, 15 mins, Pain  10/05/23:  Nustep level 3 x 10 f/b Rockerboard x 3 minutes f/b slant board x 3 minutes f/b IASTM x 7 minutes f/b HMP and pre-mod e'stim x 20 minutes.    10/02/23:  Nustep level 3 x 10 minutes  f/b STW/M including IASTM x 13 minutes f/b f/b HMP and Pre-mod e stim x 20 minutes.  Normal modality response following removal of modality.   PATIENT EDUCATION:  Education details: See below. Person educated: Patient Education method: Explanation Education comprehension: verbalized understanding, handout.  HOME EXERCISE PROGRAM: HOME EXERCISE PROGRAM Created by [place] Applegate Feb 28th, 2025 View at [email or web address] using code: [email or web address]  Page 1 of 1 2 Exercises STANDING CALF STRETCH - SOLEUS Start by standing in front of a wall or other sturdy object. Step forward with one foot and maintain your toes on both feet to be pointed straight forward. Keep the leg behind you with a bent knee during the stretch.  Lean forward towards the wall and support yourself with your  arms as you allow your front knee to bend until a gentle stretch is felt along the back of your leg that is most behind you.  Move closer or further away from the wall to control the stretch of the back leg. Also you can adjust the bend of the front knee to control the stretch as well. Repeat 2 Times Hold 1 Minute Complete 2 Sets Perform 3 Times a Day STANDING CALF STRETCH - GASTROCNEMIUS Start by standing in front of a wall or other sturdy object. Step forward with one foot and maintain your toes on both feet to be pointed straight forward. Keep the leg behind you with a straight knee during the stretch.  Lean forward towards the wall and support yourself with your arms as you allow your front knee to bend until a gentle stretch is felt along the back of your leg that is most behind you.  Move closer or further away from the wall to control the stretch of the back leg. Also you can adjust the bend of the front knee to control the stretch as well. Repeat 2 Times Hold 1 Minute Complete 1 Set Perform 3 Times a Day  ASSESSMENT:  CLINICAL IMPRESSION:   Patient was progressed with ankle circles and heel raises for improved gastrocnemius engagement. She required minimal cueing with these interventions for proper lower extremity positioning to promote proper biomechanics. She was educated on how to modify today's interventions to include them in her HEP as she noted that today's active interventions were able to reduce her familiar symptoms. She reported understanding. She reported feeling better as she did not have any burning upon the conclusion of treatment. She continues to require skilled physical therapy to address her remaining impairments to return to her  prior level of function.   OBJECTIVE IMPAIRMENTS: decreased activity tolerance, decreased mobility, difficulty walking, decreased ROM, and pain.   ACTIVITY LIMITATIONS: standing and locomotion level  PARTICIPATION LIMITATIONS:  shopping and community activity  PERSONAL FACTORS: Age, Past/current experiences, Time since onset of injury/illness/exacerbation, and 3+ comorbidities: Osteopenia, Crohn's disease, and arthritis  are also affecting patient's functional outcome.   `1 REHAB POTENTIAL: Good  CLINICAL DECISION MAKING: Stable/uncomplicated  EVALUATION COMPLEXITY: Low   GOALS: Goals reviewed with patient? Yes  LONG TERM GOALS: Target date: 10/18/23  Patient will be independent with her HEP. Baseline:  Goal status: INITIAL  2.  Patient will improve her left ankle dorsiflexion to within 5 degrees of neutral for improved ankle mobility. Baseline:  Goal status: INITIAL  3.  Patient will be able to complete her daily activities without her familiar pain exceeding 1/10. Baseline:  Goal status: INITIAL  4.  Patient will be able to return to her normal gym activities without being limited by her familiar left foot symptoms. Baseline:  Goal status: INITIAL  PLAN:  PT FREQUENCY: 2x/week  PT DURATION: 4 weeks  PLANNED INTERVENTIONS: 16109- PT Re-evaluation, 97110-Therapeutic exercises, 97530- Therapeutic activity, 97112- Neuromuscular re-education, 97535- Self Care, 60454- Manual therapy, G0283- Electrical stimulation (unattended), 97016- Vasopneumatic device, Patient/Family education, Balance training, Stair training, Joint mobilization, Cryotherapy, and Moist heat  PLAN FOR NEXT SESSION: NuStep, manual therapy, rocker board, and modalities as needed   Granville Lewis, PT 10/15/2023, 10:19 AM

## 2023-10-18 ENCOUNTER — Ambulatory Visit

## 2023-10-18 DIAGNOSIS — M79672 Pain in left foot: Secondary | ICD-10-CM

## 2023-10-18 NOTE — Therapy (Signed)
 OUTPATIENT PHYSICAL THERAPY LOWER EXTREMITY TREATMENT   Patient Name: BRYNLEI KLAUSNER MRN: [id number] DOB:01-04-1946, 78 y.o., female Today's Date: 10/18/2023  END OF SESSION:  PT End of Session - 10/18/23 0937     Visit Number 7    Number of Visits 8    Date for PT Re-Evaluation 11/23/23    PT Start Time 0931    PT Stop Time 1032    PT Time Calculation (min) 61 min    Activity Tolerance Patient tolerated treatment well    Behavior During Therapy [facility] for tasks assessed/performed               Past Medical History:  Diagnosis Date   Abdominal pain 10/18/2010   Anemia    Arthritis    Constipation 10/18/2010   Crohn's disease (HCC) 10/18/2010   SMALL BOWELS   Diarrhea 10/18/2010   Diverticulosis    Heart murmur    ECHO 2022   History of kidney stones    Kidney stone on left side    Past Surgical History:  Procedure Laterality Date   BIOPSY  06/02/2020   Procedure: BIOPSY;  Surgeon: Malissa Hippo, MD;  [facility];  Service: Endoscopy;;   BREAST SURGERY Right    6962,9528 Lumpectomy   COLONOSCOPY  10/16/2001   COLONOSCOPY  03/15/2012   Procedure: COLONOSCOPY;  Surgeon: Malissa Hippo, MD;  [facility];  Service: Endoscopy;  Laterality: N/A;  730   COLONOSCOPY WITH PROPOFOL N/A 06/02/2020   Procedure: COLONOSCOPY WITH PROPOFOL;  Surgeon: Malissa Hippo, MD;  [facility];  Service: Endoscopy;  Laterality: N/A;  730   COLONOSCOPY WITH PROPOFOL N/A 07/21/2022   Procedure: COLONOSCOPY WITH PROPOFOL;  Surgeon: Dolores Frame, MD;  [facility];  Service: Gastroenterology;  Laterality: N/A;  9:45am, asa 1-2   CYSTOSCOPY W/ URETERAL STENT PLACEMENT Left 10/31/2022   Procedure: CYSTOSCOPY WITH RETROGRADE PYELOGRAM/URETERAL STENT PLACEMENT;  Surgeon: Sebastian Ache, MD;  [facility];  Service: Urology;  Laterality: Left;   CYSTOSCOPY WITH RETROGRADE PYELOGRAM, URETEROSCOPY AND STENT PLACEMENT Left  11/15/2022   Procedure: CYSTOSCOPY WITH RETROGRADE PYELOGRAM, URETEROSCOPY AND STENT PLACEMENT, BASKET STONE REMOVAL;  Surgeon: Loletta Parish., MD;  [facility];  Service: Urology;  Laterality: Left;  75 MINS   DILATION AND CURETTAGE OF UTERUS  09/1999   Polyp resection   ESOPHAGEAL DILATION N/A 06/02/2020   Procedure: ESOPHAGEAL DILATION;  Surgeon: Malissa Hippo, MD;  [facility];  Service: Endoscopy;  Laterality: N/A;   ESOPHAGOGASTRODUODENOSCOPY (EGD) WITH PROPOFOL N/A 06/02/2020   Procedure: ESOPHAGOGASTRODUODENOSCOPY (EGD) WITH PROPOFOL;  Surgeon: Malissa Hippo, MD;  [facility];  Service: Endoscopy;  Laterality: N/A;   HEMOSTASIS CLIP PLACEMENT  07/21/2022   Procedure: HEMOSTASIS CLIP PLACEMENT;  Surgeon: Dolores Frame, MD;  [facility];  Service: Gastroenterology;;   HOLMIUM LASER APPLICATION Left 11/15/2022   Procedure: HOLMIUM LASER APPLICATION;  Surgeon: Loletta Parish., MD;  [facility];  Service: Urology;  Laterality: Left;   LAPAROSCOPIC CHOLECYSTECTOMY  12/17/1996   DEMASON   POLYPECTOMY  07/21/2022   Procedure: POLYPECTOMY INTESTINAL;  Surgeon: Dolores Frame, MD;  [facility];  Service: Gastroenterology;;   Patient Active Problem List   Diagnosis Date Noted   History of hypokalemia 04/04/2023   Abnormal complete blood count 04/04/2023   Hydronephrosis concurrent with and due to calculi of kidney and ureter 10/31/2022  Hypokalemia 10/31/2022   AKI (acute kidney injury) (HCC) 10/31/2022   Burning sensation of mouth 01/05/2022   Crohn's disease, small intestine (HCC) 10/31/2021   Osteopenia 10/26/2020   Crohn's disease of both small and large intestine (HCC) 04/27/2020   History of colonic polyps 10/21/2019   Abdominal pain, epigastric 02/17/2013   Nausea 02/17/2013   GERD (gastroesophageal reflux disease) 08/22/2011   Fracture of rib of right side 08/22/2011   Constipation  10/18/2010    PCP: Richardean Chimera, MD  REFERRING PROVIDER: Adam Phenix, DPM  REFERRING DIAG: Recurrent left heel pain  THERAPY DIAG:  Pain in left foot  Rationale for Evaluation and Treatment: Rehabilitation  ONSET DATE: June 2024  SUBJECTIVE:   SUBJECTIVE STATEMENT: Patient reports that she hurts a little when she walks, but not too bad. After her last appointment, she hurt some when she first started walking, but it got better.  PERTINENT HISTORY: Osteopenia, Crohn's disease, and arthritis PAIN:  Are you having pain? Yes.  3/10  PRECAUTIONS: None  RED FLAGS: None   WEIGHT BEARING RESTRICTIONS: No  FALLS:  Has patient fallen in last 6 months? No  LIVING ENVIRONMENT: Lives with: lives alone Lives in: House/apartment Stairs: No Has following equipment at home: None  OCCUPATION: part-time in education, but primarily sitting  PLOF: Independent  PATIENT GOALS: reduced pain, be able to go back to the gym (2-3 times per week prior to injury, but none current), improved balance and safety going down steps, and be able to walk longer  NEXT MD VISIT: summer 2025  OBJECTIVE:  Note: Objective measures were completed at Evaluation unless otherwise noted.  COGNITION: Overall cognitive status: Within functional limits for tasks assessed     SENSATION: Patient reports no numbness or tingling  PALPATION: TTP: left calcaneus   LOWER EXTREMITY ROM:  Active ROM Right eval Left eval  Hip flexion    Hip extension    Hip abduction    Hip adduction    Hip internal rotation    Hip external rotation    Knee flexion    Knee extension    Ankle dorsiflexion -5 -10; "pulling" in calf and heel   Ankle plantarflexion 40 45  Ankle inversion 30 30  Ankle eversion 27 25   (Blank rows = not tested)  LOWER EXTREMITY MMT:  MMT Right eval Left eval  Hip flexion    Hip extension    Hip abduction    Hip adduction    Hip internal rotation    Hip external rotation     Knee flexion    Knee extension    Ankle dorsiflexion 4+/5 5/5  Ankle plantarflexion    Ankle inversion 5/5 5/5  Ankle eversion 4/5 4/5   (Blank rows = not tested)  GAIT: Assistive device utilized: None Level of assistance: Complete Independence Comments: No significant gait deviations observed  TREATMENT DATE:                                    10/18/23 EXERCISE LOG  Exercise Repetitions and Resistance Comments  Nustep  L4 x 18 minutes   Rocker board  3.5 minutes   BAPS  25 reps each  DF/PF and inversion/ eversion  Ankle circles 2 minutes  CW and CCW       Blank cell = exercise not performed today  Manual Therapy Soft Tissue Mobilization: plantar fascia, for reduced pain   Modalities  Date:  Unattended Estim: left plantar fascia, pre mod @ 80-150 Hz, 15 mins, Pain                                   10/15/23 EXERCISE LOG  Exercise Repetitions and Resistance Comments  Nustep  L3 x 11 minutes   Standing gastroc stretch  4 x 30 seconds   Standing heel raise  20 reps    Ankle circles 1 minute each  CW and CCW        Blank cell = exercise not performed today  Modalities: no redness or adverse reaction to today's modalities  Date:  Unattended Estim: left plantar fascia, pre mod @ 80-150 Hz, 15 mins, Pain  10/05/23:  Nustep level 3 x 10 f/b Rockerboard x 3 minutes f/b slant board x 3 minutes f/b IASTM x 7 minutes f/b HMP and pre-mod e'stim x 20 minutes.    PATIENT EDUCATION:  Education details: See below. Person educated: Patient Education method: Explanation Education comprehension: verbalized understanding, handout.  HOME EXERCISE PROGRAM: HOME EXERCISE PROGRAM Created by [place] Applegate Feb 28th, 2025 View at [email or web address] using code: [email or web address]  Page 1 of 1 2 Exercises STANDING CALF STRETCH - SOLEUS Start by standing in front of a  wall or other sturdy object. Step forward with one foot and maintain your toes on both feet to be pointed straight forward. Keep the leg behind you with a bent knee during the stretch.  Lean forward towards the wall and support yourself with your arms as you allow your front knee to bend until a gentle stretch is felt along the back of your leg that is most behind you.  Move closer or further away from the wall to control the stretch of the back leg. Also you can adjust the bend of the front knee to control the stretch as well. Repeat 2 Times Hold 1 Minute Complete 2 Sets Perform 3 Times a Day STANDING CALF STRETCH - GASTROCNEMIUS Start by standing in front of a wall or other sturdy object. Step forward with one foot and maintain your toes on both feet to be pointed straight forward. Keep the leg behind you with a straight knee during the stretch.  Lean forward towards the wall and support yourself with your arms as you allow your front knee to bend until a gentle stretch is felt along the back of your leg that is most behind you.  Move closer or further away from the wall to control the stretch of the back leg. Also you can adjust the bend of the front knee to control the stretch as well. Repeat 2 Times Hold 1 Minute Complete 1 Set Perform 3 Times a Day  ASSESSMENT:  CLINICAL IMPRESSION:   Patient was introduced to BAPS interventions for improved  ankle stability. She required moderate verbal and tactile cueing with BAPS inversion and eversion to limit hip internal and external rotation. BAPS circles were attempted, but she was unable to properly complete this intervention. Manual therapy focused on soft tissue mobilization to her plantar fascia with moderate improvement. She reported feeling alright upon the conclusion of treatment. She continues to require skilled physical therapy to address her remaining impairments to return to her prior level of function.    OBJECTIVE IMPAIRMENTS:  decreased activity tolerance, decreased mobility, difficulty walking, decreased ROM, and pain.   ACTIVITY LIMITATIONS: standing and locomotion level  PARTICIPATION LIMITATIONS: shopping and community activity  PERSONAL FACTORS: Age, Past/current experiences, Time since onset of injury/illness/exacerbation, and 3+ comorbidities: Osteopenia, Crohn's disease, and arthritis  are also affecting patient's functional outcome.   `1 REHAB POTENTIAL: Good  CLINICAL DECISION MAKING: Stable/uncomplicated  EVALUATION COMPLEXITY: Low   GOALS: Goals reviewed with patient? Yes  LONG TERM GOALS: Target date: 10/18/23  Patient will be independent with her HEP. Baseline:  Goal status: INITIAL  2.  Patient will improve her left ankle dorsiflexion to within 5 degrees of neutral for improved ankle mobility. Baseline:  Goal status: INITIAL  3.  Patient will be able to complete her daily activities without her familiar pain exceeding 1/10. Baseline:  Goal status: INITIAL  4.  Patient will be able to return to her normal gym activities without being limited by her familiar left foot symptoms. Baseline:  Goal status: INITIAL  PLAN:  PT FREQUENCY: 2x/week  PT DURATION: 4 weeks  PLANNED INTERVENTIONS: 43329- PT Re-evaluation, 97110-Therapeutic exercises, 97530- Therapeutic activity, 97112- Neuromuscular re-education, 97535- Self Care, 51884- Manual therapy, G0283- Electrical stimulation (unattended), 97016- Vasopneumatic device, Patient/Family education, Balance training, Stair training, Joint mobilization, Cryotherapy, and Moist heat  PLAN FOR NEXT SESSION: NuStep, manual therapy, rocker board, and modalities as needed   Granville Lewis, PT 10/18/2023, 5:54 PM

## 2023-10-22 ENCOUNTER — Ambulatory Visit

## 2023-10-22 DIAGNOSIS — M79672 Pain in left foot: Secondary | ICD-10-CM | POA: Diagnosis not present

## 2023-10-22 NOTE — Therapy (Addendum)
 OUTPATIENT PHYSICAL THERAPY LOWER EXTREMITY TREATMENT   Patient Name: Gail Henry MRN: [id number] DOB:Oct 12, 1945, 78 y.o., female Today's Date: 10/22/2023  END OF SESSION:  PT End of Session - 10/22/23 1019     Visit Number 8    Number of Visits 8    Date for PT Re-Evaluation 11/23/23    PT Start Time 1015    PT Stop Time 1113    PT Time Calculation (min) 58 min    Activity Tolerance Patient tolerated treatment well    Behavior During Therapy [facility] for tasks assessed/performed               Past Medical History:  Diagnosis Date   Abdominal pain 10/18/2010   Anemia    Arthritis    Constipation 10/18/2010   Crohn's disease (HCC) 10/18/2010   SMALL BOWELS   Diarrhea 10/18/2010   Diverticulosis    Heart murmur    ECHO 2022   History of kidney stones    Kidney stone on left side    Past Surgical History:  Procedure Laterality Date   BIOPSY  06/02/2020   Procedure: BIOPSY;  Surgeon: Malissa Hippo, MD;  [facility];  Service: Endoscopy;;   BREAST SURGERY Right    7829,5621 Lumpectomy   COLONOSCOPY  10/16/2001   COLONOSCOPY  03/15/2012   Procedure: COLONOSCOPY;  Surgeon: Malissa Hippo, MD;  [facility];  Service: Endoscopy;  Laterality: N/A;  730   COLONOSCOPY WITH PROPOFOL N/A 06/02/2020   Procedure: COLONOSCOPY WITH PROPOFOL;  Surgeon: Malissa Hippo, MD;  [facility];  Service: Endoscopy;  Laterality: N/A;  730   COLONOSCOPY WITH PROPOFOL N/A 07/21/2022   Procedure: COLONOSCOPY WITH PROPOFOL;  Surgeon: Dolores Frame, MD;  [facility];  Service: Gastroenterology;  Laterality: N/A;  9:45am, asa 1-2   CYSTOSCOPY W/ URETERAL STENT PLACEMENT Left 10/31/2022   Procedure: CYSTOSCOPY WITH RETROGRADE PYELOGRAM/URETERAL STENT PLACEMENT;  Surgeon: Sebastian Ache, MD;  [facility];  Service: Urology;  Laterality: Left;   CYSTOSCOPY WITH RETROGRADE PYELOGRAM, URETEROSCOPY AND STENT PLACEMENT Left  11/15/2022   Procedure: CYSTOSCOPY WITH RETROGRADE PYELOGRAM, URETEROSCOPY AND STENT PLACEMENT, BASKET STONE REMOVAL;  Surgeon: Loletta Parish., MD;  [facility];  Service: Urology;  Laterality: Left;  75 MINS   DILATION AND CURETTAGE OF UTERUS  09/1999   Polyp resection   ESOPHAGEAL DILATION N/A 06/02/2020   Procedure: ESOPHAGEAL DILATION;  Surgeon: Malissa Hippo, MD;  [facility];  Service: Endoscopy;  Laterality: N/A;   ESOPHAGOGASTRODUODENOSCOPY (EGD) WITH PROPOFOL N/A 06/02/2020   Procedure: ESOPHAGOGASTRODUODENOSCOPY (EGD) WITH PROPOFOL;  Surgeon: Malissa Hippo, MD;  [facility];  Service: Endoscopy;  Laterality: N/A;   HEMOSTASIS CLIP PLACEMENT  07/21/2022   Procedure: HEMOSTASIS CLIP PLACEMENT;  Surgeon: Dolores Frame, MD;  [facility];  Service: Gastroenterology;;   HOLMIUM LASER APPLICATION Left 11/15/2022   Procedure: HOLMIUM LASER APPLICATION;  Surgeon: Loletta Parish., MD;  [facility];  Service: Urology;  Laterality: Left;   LAPAROSCOPIC CHOLECYSTECTOMY  12/17/1996   DEMASON   POLYPECTOMY  07/21/2022   Procedure: POLYPECTOMY INTESTINAL;  Surgeon: Dolores Frame, MD;  [facility];  Service: Gastroenterology;;   Patient Active Problem List   Diagnosis Date Noted   History of hypokalemia 04/04/2023   Abnormal complete blood count 04/04/2023   Hydronephrosis concurrent with and due to calculi of kidney and ureter 10/31/2022  Hypokalemia 10/31/2022   AKI (acute kidney injury) (HCC) 10/31/2022   Burning sensation of mouth 01/05/2022   Crohn's disease, small intestine (HCC) 10/31/2021   Osteopenia 10/26/2020   Crohn's disease of both small and large intestine (HCC) 04/27/2020   History of colonic polyps 10/21/2019   Abdominal pain, epigastric 02/17/2013   Nausea 02/17/2013   GERD (gastroesophageal reflux disease) 08/22/2011   Fracture of rib of right side 08/22/2011   Constipation  10/18/2010    PCP: Richardean Chimera, MD  REFERRING PROVIDER: Adam Phenix, DPM  REFERRING DIAG: Recurrent left heel pain  THERAPY DIAG:  Pain in left foot  Rationale for Evaluation and Treatment: Rehabilitation  ONSET DATE: June 2024  SUBJECTIVE:   SUBJECTIVE STATEMENT: Pt reports 2-3/10 left foot pain today.  Ready for discharge.   PERTINENT HISTORY: Osteopenia, Crohn's disease, and arthritis PAIN:  Are you having pain? Yes. 2-3/10  PRECAUTIONS: None  RED FLAGS: None   WEIGHT BEARING RESTRICTIONS: No  FALLS:  Has patient fallen in last 6 months? No  LIVING ENVIRONMENT: Lives with: lives alone Lives in: House/apartment Stairs: No Has following equipment at home: None  OCCUPATION: part-time in education, but primarily sitting  PLOF: Independent  PATIENT GOALS: reduced pain, be able to go back to the gym (2-3 times per week prior to injury, but none current), improved balance and safety going down steps, and be able to walk longer  NEXT MD VISIT: summer 2025  OBJECTIVE:  Note: Objective measures were completed at Evaluation unless otherwise noted.  COGNITION: Overall cognitive status: Within functional limits for tasks assessed     SENSATION: Patient reports no numbness or tingling  PALPATION: TTP: left calcaneus   LOWER EXTREMITY ROM:  Active ROM Right eval Left eval  Hip flexion    Hip extension    Hip abduction    Hip adduction    Hip internal rotation    Hip external rotation    Knee flexion    Knee extension    Ankle dorsiflexion -5 -10; "pulling" in calf and heel   Ankle plantarflexion 40 45  Ankle inversion 30 30  Ankle eversion 27 25   (Blank rows = not tested)  LOWER EXTREMITY MMT:  MMT Right eval Left eval  Hip flexion    Hip extension    Hip abduction    Hip adduction    Hip internal rotation    Hip external rotation    Knee flexion    Knee extension    Ankle dorsiflexion 4+/5 5/5  Ankle plantarflexion    Ankle  inversion 5/5 5/5  Ankle eversion 4/5 4/5   (Blank rows = not tested)  GAIT: Assistive device utilized: None Level of assistance: Complete Independence Comments: No significant gait deviations observed  TREATMENT DATE:                                    10/22/23 EXERCISE LOG  Exercise Repetitions and Resistance Comments  Nustep  L4 x 18 minutes   Rocker board  3.5 minutes   BAPS    DF/PF and inversion/ eversion  Ankle circles 2 minutes  CW and CCW  Towel Stretch 30 sec x 4 reps    Blank cell = exercise not performed today  Manual Therapy Soft Tissue Mobilization: plantar fascia, for reduced pain   Modalities  Date:  Unattended Estim: Ankle and left plantar fascia, pre mod @ 80-150 Hz, 15 mins, Pain Vaso: Ankle, 34 degrees, 15 mins, Pain                                   10/15/23 EXERCISE LOG  Exercise Repetitions and Resistance Comments  Nustep  L3 x 11 minutes   Standing gastroc stretch  4 x 30 seconds   Standing heel raise  20 reps    Ankle circles 1 minute each  CW and CCW        Blank cell = exercise not performed today  Modalities: no redness or adverse reaction to today's modalities  Date:  Unattended Estim: left plantar fascia, pre mod @ 80-150 Hz, 15 mins, Pain  10/05/23:  Nustep level 3 x 10 f/b Rockerboard x 3 minutes f/b slant board x 3 minutes f/b IASTM x 7 minutes f/b HMP and pre-mod e'stim x 20 minutes.    PATIENT EDUCATION:  Education details: See below. Person educated: Patient Education method: Explanation Education comprehension: verbalized understanding, handout.  HOME EXERCISE PROGRAM: HOME EXERCISE PROGRAM Created by [place] Applegate Feb 28th, 2025 View at [email or web address] using code: [email or web address]  Page 1 of 1 2 Exercises STANDING CALF STRETCH - SOLEUS Start by standing in front of a wall or other sturdy object.  Step forward with one foot and maintain your toes on both feet to be pointed straight forward. Keep the leg behind you with a bent knee during the stretch.  Lean forward towards the wall and support yourself with your arms as you allow your front knee to bend until a gentle stretch is felt along the back of your leg that is most behind you.  Move closer or further away from the wall to control the stretch of the back leg. Also you can adjust the bend of the front knee to control the stretch as well. Repeat 2 Times Hold 1 Minute Complete 2 Sets Perform 3 Times a Day STANDING CALF STRETCH - GASTROCNEMIUS Start by standing in front of a wall or other sturdy object. Step forward with one foot and maintain your toes on both feet to be pointed straight forward. Keep the leg behind you with a straight knee during the stretch.  Lean forward towards the wall and support yourself with your arms as you allow your front knee to bend until a gentle stretch is felt along the back of your leg that is most behind you.  Move closer or further away from the wall to control the stretch of the back leg. Also you can adjust the bend of the front knee to control the stretch as well. Repeat 2 Times Hold 1 Minute Complete 1 Set Perform 3 Times a Day  ASSESSMENT:  CLINICAL IMPRESSION:    Pt arrives for today's treatment session reporting 2-3/10 left foot pain.  Pt states that she has been up since 5:30 working which explains her pain.  Pt reports that she has not started going back to the gym as of yet, but she plans to do so.  Pt given information on local [employer]/gym.  Pt able to demonstrate -8 degrees of active left ankle dorsiflexion and 4 degrees of passive left ankle dorsiflexion, partially meeting her goal.  Pt states that her pain level greatly depends on the activities that she if performing on any given day.  Towel stretches added to pt's HEP.  Normal responses to estim and vaso noted upon removal.  Pt  reported decreased pain at completion of today's treatment session.   PHYSICAL THERAPY DISCHARGE SUMMARY  Visits from Start of Care: 8  Current functional level related to goals / functional outcomes: Patient was able to demonstrate slight improvement with skilled physical therapy, but she was unable to meet her long term AROM and pain goals.    Remaining deficits: AROM and pain    Education / Equipment: HEP    Patient agrees to discharge. Patient goals were partially met. Patient is being discharged due to maximized rehab potential.   Candi Leash, PT, DPT    OBJECTIVE IMPAIRMENTS: decreased activity tolerance, decreased mobility, difficulty walking, decreased ROM, and pain.   ACTIVITY LIMITATIONS: standing and locomotion level  PARTICIPATION LIMITATIONS: shopping and community activity  PERSONAL FACTORS: Age, Past/current experiences, Time since onset of injury/illness/exacerbation, and 3+ comorbidities: Osteopenia, Crohn's disease, and arthritis  are also affecting patient's functional outcome.   `1 REHAB POTENTIAL: Good  CLINICAL DECISION MAKING: Stable/uncomplicated  EVALUATION COMPLEXITY: Low   GOALS: Goals reviewed with patient? Yes  LONG TERM GOALS: Target date: 10/18/23  Patient will be independent with her HEP. Baseline:  Goal status: MET  2.  Patient will improve her left ankle dorsiflexion to within 5 degrees of neutral for improved ankle mobility. Baseline: -8 degrees AROM, 4 degrees PROM Goal status: PARTIALLY MET  3.  Patient will be able to complete her daily activities without her familiar pain exceeding 1/10. Baseline: "depends on the day or activity level" Goal status: PARTIALLY MET  4.  Patient will be able to return to her normal gym activities without being limited by her familiar left foot symptoms. Baseline:  Goal status: IN PROGRESS  PLAN:  PT FREQUENCY: 2x/week  PT DURATION: 4 weeks  PLANNED INTERVENTIONS: 97164- PT Re-evaluation,  97110-Therapeutic exercises, 97530- Therapeutic activity, 97112- Neuromuscular re-education, 97535- Self Care, 60454- Manual therapy, G0283- Electrical stimulation (unattended), 97016- Vasopneumatic device, Patient/Family education, Balance training, Stair training, Joint mobilization, Cryotherapy, and Moist heat  PLAN FOR NEXT SESSION: NuStep, manual therapy, rocker board, and modalities as needed   Newman Pies, PTA 10/22/2023, 11:13 AM

## 2023-11-12 ENCOUNTER — Other Ambulatory Visit (INDEPENDENT_AMBULATORY_CARE_PROVIDER_SITE_OTHER): Payer: Self-pay | Admitting: Gastroenterology

## 2023-11-22 ENCOUNTER — Ambulatory Visit (INDEPENDENT_AMBULATORY_CARE_PROVIDER_SITE_OTHER): Admitting: Gastroenterology

## 2023-11-22 ENCOUNTER — Encounter (INDEPENDENT_AMBULATORY_CARE_PROVIDER_SITE_OTHER): Payer: Self-pay | Admitting: Gastroenterology

## 2023-11-22 VITALS — BP 142/86 | HR 72 | Temp 97.8°F | Ht 66.5 in | Wt 171.6 lb

## 2023-11-22 DIAGNOSIS — K219 Gastro-esophageal reflux disease without esophagitis: Secondary | ICD-10-CM | POA: Diagnosis not present

## 2023-11-22 DIAGNOSIS — Z111 Encounter for screening for respiratory tuberculosis: Secondary | ICD-10-CM

## 2023-11-22 DIAGNOSIS — K5080 Crohn's disease of both small and large intestine without complications: Secondary | ICD-10-CM

## 2023-11-22 DIAGNOSIS — Z1159 Encounter for screening for other viral diseases: Secondary | ICD-10-CM

## 2023-11-22 NOTE — Progress Notes (Unsigned)
 Gail Henry, M.D. Gastroenterology & Hepatology [facility] Gastroenterology [address] [place], [place] [address]  Primary Care Physician: Leesa Pulling, MD [address] [place] [address]  I will communicate my assessment and recommendations to the referring MD via EMR.  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 female with past medical history of complex medical history of ileocolonic Crohn's disease status post colon resection x2 (once due to microperforation), GERD, coming for follow-up of Crohn's disease.   The patient was last seen on 06/25/2019 for. At that time, the patient was advised to continue Stelara  every 8 weeks.  Patient reports that since she last saw me in the office, she has had 2 episodes of abdominal pain diffusely but worse in the right side.   She is having 1-3 Bms per day, mostly soft. No rectal bleeding or melena.  She is presenting frequent bloating after having a meal.  She reports that she felt much better while she was on Entyvio . Had MRE showing mild inflammation distal neo TI. - due to this decision was made to switch to Stelara . Next dose of Stelara  is in 5/22  The patient denies having any nausea, vomiting, fever, chills, hematochezia, melena, hematemesis, abdominal distention, abdominal pain, diarrhea, jaundice, pruritus or weight loss.  Last flu shot:2024 Last pneumonia shot: 2019 Last evaluation by dermatology: 2023, has appointment tomorrow Last zoster vaccine: 2020 COVID-19 shot:  did the first round and one booster   Previously used biologicals: Entyvio    Last Colonoscopy:06/2022 Presence of 5 small 3 to 4 mm ulcers consistent with Rutgeers i1.  There was presence of an anastomotic stenosis.   There was evidence of a prior end-to-side site ileocolonic anastomosis in the ascending colon with a stenosis measuring 1 cm x 0.5 cm.  8 mm polyp was removed from the anastomosis (inflammatory polyp), which was subsequently clipped.  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   Recommended repeat colonoscopy in 2 years due to longstanding history of ileocolonic Crohn's disease  Past Medical History: Past Medical History:  Diagnosis Date   Abdominal pain 10/18/2010   Anemia    Arthritis    Constipation 10/18/2010   Crohn's disease (HCC) 10/18/2010   SMALL BOWELS   Diarrhea 10/18/2010   Diverticulosis    Heart murmur    ECHO 2022   History of kidney stones    Kidney stone on left side     Past Surgical History: Past Surgical History:  Procedure Laterality Date   BIOPSY  06/02/2020   Procedure: BIOPSY;  Surgeon: Ruby Corporal, MD;  [facility];  Service: Endoscopy;;   BREAST SURGERY Right    2956,2130 Lumpectomy   COLONOSCOPY  10/16/2001   COLONOSCOPY  03/15/2012   Procedure: COLONOSCOPY;  Surgeon: Ruby Corporal, MD;  [facility];  Service: Endoscopy;  Laterality: N/A;  730   COLONOSCOPY WITH PROPOFOL  N/A 06/02/2020   Procedure: COLONOSCOPY WITH PROPOFOL ;  Surgeon: Ruby Corporal, MD;  [facility];  Service: Endoscopy;  Laterality: N/A;  730   COLONOSCOPY WITH PROPOFOL  N/A 07/21/2022   Procedure: COLONOSCOPY WITH PROPOFOL ;  Surgeon:  Urban Garden, MD;  [facility];  Service: Gastroenterology;  Laterality: N/A;  9:45am, asa 1-2   CYSTOSCOPY W/ URETERAL STENT PLACEMENT Left 10/31/2022   Procedure: CYSTOSCOPY WITH RETROGRADE PYELOGRAM/URETERAL STENT PLACEMENT;  Surgeon: Osborn Blaze, MD;  [facility];  Service: Urology;  Laterality:  Left;   CYSTOSCOPY WITH RETROGRADE PYELOGRAM, URETEROSCOPY AND STENT PLACEMENT Left 11/15/2022   Procedure: CYSTOSCOPY WITH RETROGRADE PYELOGRAM, URETEROSCOPY AND STENT PLACEMENT, BASKET STONE REMOVAL;  Surgeon: Melody Spurling., MD;  [facility];  Service: Urology;  Laterality: Left;  75 MINS   DILATION AND CURETTAGE OF UTERUS  09/1999   Polyp resection   ESOPHAGEAL DILATION N/A 06/02/2020   Procedure: ESOPHAGEAL DILATION;  Surgeon: Ruby Corporal, MD;  [facility];  Service: Endoscopy;  Laterality: N/A;   ESOPHAGOGASTRODUODENOSCOPY (EGD) WITH PROPOFOL  N/A 06/02/2020   Procedure: ESOPHAGOGASTRODUODENOSCOPY (EGD) WITH PROPOFOL ;  Surgeon: Ruby Corporal, MD;  [facility];  Service: Endoscopy;  Laterality: N/A;   HEMOSTASIS CLIP PLACEMENT  07/21/2022   Procedure: HEMOSTASIS CLIP PLACEMENT;  Surgeon: Urban Garden, MD;  [facility];  Service: Gastroenterology;;   HOLMIUM LASER APPLICATION Left 11/15/2022   Procedure: HOLMIUM LASER APPLICATION;  Surgeon: Melody Spurling., MD;  [facility];  Service: Urology;  Laterality: Left;   LAPAROSCOPIC CHOLECYSTECTOMY  12/17/1996   DEMASON   POLYPECTOMY  07/21/2022   Procedure: POLYPECTOMY INTESTINAL;  Surgeon: Umberto Ganong, Bearl Limes, MD;  [facility];  Service: Gastroenterology;;    Family History: Family History  Problem Relation Age of Onset   Heart disease Father    Healthy Sister    Healthy Brother    Healthy Sister    Healthy Daughter    Healthy Son     Social History: Social History   Tobacco Use  Smoking Status Never   Passive exposure: Never  Smokeless Tobacco Never   Social History   Substance and Sexual Activity  Alcohol Use No   Alcohol/week: 0.0 standard drinks of alcohol   Social History   Substance and Sexual Activity  Drug Use No    Allergies: Allergies  Allergen Reactions   Penicillins Hives   Dexilant [Dexlansoprazole] Other  (See Comments)    Muscle pain   Nexium [Esomeprazole Magnesium] Nausea Only    Medications: Current Outpatient Medications  Medication Sig Dispense Refill   acetaminophen  (TYLENOL ) 500 MG tablet Take 500 mg by mouth every 6 (six) hours as needed for moderate pain or headache.     Carboxymethylcellul-Glycerin (REFRESH RELIEVA OP) Place 1-2 drops into both eyes 3 (three) times daily as needed (dry/irritated eyes.).     Cholecalciferol (VITAMIN D3) 2000 UNITS TABS Take 2,000 Units by mouth in the morning.     docusate sodium  (COLACE) 100 MG capsule Take 2 capsules (200 mg total) by mouth at bedtime. (Patient taking differently: Take 200 mg by mouth daily as needed (constipation.).)  0   Multiple Vitamins-Minerals (MULTIVITAMIN WITH MINERALS) tablet Take 2 tablets by mouth in the morning. forbia     OVER THE COUNTER MEDICATION Vit b complex with c one daily     RABEprazole  (ACIPHEX ) 20 MG tablet TAKE 1 TABLET BY MOUTH DAILY BEFORE breakfast 90 tablet 2   sodium chloride  (OCEAN) 0.65 % SOLN nasal spray Place 1 spray into both nostrils in the morning and at bedtime.     ustekinumab  (STELARA ) 90 MG/ML SOSY injection Inject 1 mL (90 mg total) into the skin  every 8 (eight) weeks. Due for SQ on 07/05/2023. 1 mL 5   No current facility-administered medications for this visit.    Review of Systems: GENERAL: negative for malaise, night sweats HEENT: No changes in hearing or vision, no nose bleeds or other nasal problems. NECK: Negative for lumps, goiter, pain and significant neck swelling RESPIRATORY: Negative for cough, wheezing CARDIOVASCULAR: Negative for chest pain, leg swelling, palpitations, orthopnea GI: SEE HPI MUSCULOSKELETAL: Negative for joint pain or swelling, back pain, and muscle pain. SKIN: Negative for lesions, rash PSYCH: Negative for sleep disturbance, mood disorder and recent psychosocial stressors. HEMATOLOGY Negative for prolonged bleeding, bruising easily, and swollen  nodes. ENDOCRINE: Negative for cold or heat intolerance, polyuria, polydipsia and goiter. NEURO: negative for tremor, gait imbalance, syncope and seizures. The remainder of the review of systems is noncontributory.   Physical Exam: BP (!) 142/86 (BP Location: Left Arm, Patient Position: Sitting, Cuff Size: Normal)   Pulse 72   Temp 97.8 F (36.6 C) (Temporal)   Ht 5' 6.5" (1.689 m)   Wt 171 lb 9.6 oz (77.8 kg)   BMI 27.28 kg/m  GENERAL: The patient is AO x3, in no acute distress. HEENT: Head is normocephalic and atraumatic. EOMI are intact. Mouth is well hydrated and without lesions. NECK: Supple. No masses LUNGS: Clear to auscultation. No presence of rhonchi/wheezing/rales. Adequate chest expansion HEART: RRR, normal s1 and s2. ABDOMEN: Soft, nontender, no guarding, no peritoneal signs, and nondistended. BS +. No masses. RECTAL EXAM: no external lesions, normal tone, no masses, brown stool without blood.*** Chaperone: EXTREMITIES: Without any cyanosis, clubbing, rash, lesions or edema. NEUROLOGIC: AOx3, no focal motor deficit. SKIN: no jaundice, no rashes  Imaging/Labs: as above  I personally reviewed and interpreted the available labs, imaging and endoscopic files.  Impression and Plan: AMEAH CHANDA is a 78 y.o. female coming for follow up of ***   All questions were answered.      Gail Cress, MD Gastroenterology and Hepatology [facility] Gastroenterology

## 2023-11-22 NOTE — Patient Instructions (Addendum)
 Continue Stelara  every 8 weeks for now Perform blood workup the day before your next dose If levels of the Stelara  are low, we will need to dose optimize medication If levels of Stelara  are adequate, we will try again Entyvio  and will perform cross-sectional abdominal imaging to evaluate for possible stricture

## 2023-12-28 ENCOUNTER — Ambulatory Visit (INDEPENDENT_AMBULATORY_CARE_PROVIDER_SITE_OTHER): Payer: Self-pay | Admitting: Gastroenterology

## 2023-12-28 LAB — SERIAL MONITORING

## 2023-12-31 ENCOUNTER — Other Ambulatory Visit (INDEPENDENT_AMBULATORY_CARE_PROVIDER_SITE_OTHER): Payer: Self-pay

## 2023-12-31 ENCOUNTER — Other Ambulatory Visit: Payer: Self-pay | Admitting: *Deleted

## 2023-12-31 ENCOUNTER — Encounter: Payer: Self-pay | Admitting: *Deleted

## 2023-12-31 DIAGNOSIS — K5080 Crohn's disease of both small and large intestine without complications: Secondary | ICD-10-CM

## 2023-12-31 NOTE — Progress Notes (Signed)
 Letter below and orders mailed to the patient.    12/31/2023  Dear Gail Henry,  Per Dr. Sammi Crick, He would like for you to have your stool checked again. Please have this done at your convenience at [facility] lab. Located at [address] [place], [address]. I have enclosed the order. Please take this with you when you go to get the container. You will need to have take the container home, fill it with stool and take the sample back to the lab after collection.   If you have any questions or concerns, please don't hesitate to call.  Sincerely,    Toni Demo

## 2024-01-09 LAB — COMPREHENSIVE METABOLIC PANEL WITH GFR
ALT: 14 IU/L (ref 0–32)
AST: 19 IU/L (ref 0–40)
Albumin: 4.2 g/dL (ref 3.8–4.8)
Alkaline Phosphatase: 112 IU/L (ref 44–121)
BUN/Creatinine Ratio: 18 (ref 12–28)
BUN: 12 mg/dL (ref 8–27)
Bilirubin Total: 0.4 mg/dL (ref 0.0–1.2)
CO2: 24 mmol/L (ref 20–29)
Calcium: 9.8 mg/dL (ref 8.7–10.3)
Chloride: 104 mmol/L (ref 96–106)
Creatinine, Ser: 0.66 mg/dL (ref 0.57–1.00)
Globulin, Total: 2.7 g/dL (ref 1.5–4.5)
Glucose: 88 mg/dL (ref 70–99)
Potassium: 4.6 mmol/L (ref 3.5–5.2)
Sodium: 142 mmol/L (ref 134–144)
Total Protein: 6.9 g/dL (ref 6.0–8.5)
eGFR: 91 mL/min/{1.73_m2} (ref >59)

## 2024-01-09 LAB — CBC WITH DIFFERENTIAL/PLATELET
Basophils Absolute: 0 10*3/uL (ref 0.0–0.2)
Basos: 1 %
EOS (ABSOLUTE): 0.1 10*3/uL (ref 0.0–0.4)
Eos: 1 %
Hematocrit: 43.1 % (ref 34.0–46.6)
Hemoglobin: 14 g/dL (ref 11.1–15.9)
Immature Grans (Abs): 0 10*3/uL (ref 0.0–0.1)
Immature Granulocytes: 1 %
Lymphocytes Absolute: 1.9 10*3/uL (ref 0.7–3.1)
Lymphs: 31 %
MCH: 29.9 pg (ref 26.6–33.0)
MCHC: 32.5 g/dL (ref 31.5–35.7)
MCV: 92 fL (ref 79–97)
Monocytes Absolute: 0.4 10*3/uL (ref 0.1–0.9)
Monocytes: 7 %
Neutrophils Absolute: 3.7 10*3/uL (ref 1.4–7.0)
Neutrophils: 59 %
Platelets: 307 10*3/uL (ref 150–450)
RBC: 4.69 x10E6/uL (ref 3.77–5.28)
RDW: 13 % (ref 11.7–15.4)
WBC: 6.1 10*3/uL (ref 3.4–10.8)

## 2024-01-09 LAB — C-REACTIVE PROTEIN: CRP: 3 mg/L (ref 0–10)

## 2024-01-09 LAB — QUANTIFERON-TB GOLD PLUS
QuantiFERON Mitogen Value: >10 [IU]/mL
QuantiFERON Nil Value: 0.05 [IU]/mL
QuantiFERON TB1 Ag Value: 0.06 [IU]/mL
QuantiFERON TB2 Ag Value: 0.05 [IU]/mL
QuantiFERON-TB Gold Plus: NEGATIVE

## 2024-01-09 LAB — HEPATITIS B SURFACE ANTIGEN: Hepatitis B Surface Ag: NEGATIVE

## 2024-01-09 LAB — USTEKINUMAB AND ANTI-USTEK AB
Anti-Ustekinumab Antibody: <40 ng/mL
Ustekinumab: 5.4 ug/mL

## 2024-01-11 ENCOUNTER — Ambulatory Visit (HOSPITAL_COMMUNITY)
Admission: RE | Admit: 2024-01-11 | Discharge: 2024-01-11 | Disposition: A | Discharge status: Home / Self Care | Source: Ambulatory Visit | Attending: Gastroenterology | Admitting: Gastroenterology

## 2024-01-11 DIAGNOSIS — K5080 Crohn's disease of both small and large intestine without complications: Secondary | ICD-10-CM | POA: Insufficient documentation

## 2024-01-11 MED ORDER — IOHEXOL 300 MG/ML  SOLN
100.0000 mL | Freq: Once | INTRAMUSCULAR | Status: AC | PRN
  Administered 2024-01-11: 100 mL via INTRAVENOUS

## 2024-01-18 ENCOUNTER — Ambulatory Visit (INDEPENDENT_AMBULATORY_CARE_PROVIDER_SITE_OTHER): Payer: Self-pay | Admitting: Gastroenterology

## 2024-01-19 LAB — CALPROTECTIN: Calprotectin: 31 ug/g

## 2024-01-21 ENCOUNTER — Other Ambulatory Visit (INDEPENDENT_AMBULATORY_CARE_PROVIDER_SITE_OTHER): Payer: Self-pay

## 2024-01-21 DIAGNOSIS — K5080 Crohn's disease of both small and large intestine without complications: Secondary | ICD-10-CM

## 2024-01-21 MED ORDER — RINVOQ 45 MG PO TB24: 45.0000 mg | ORAL_TABLET | Freq: Every day | ORAL | 0 refills | Status: DC

## 2024-01-23 ENCOUNTER — Telehealth (INDEPENDENT_AMBULATORY_CARE_PROVIDER_SITE_OTHER): Payer: Self-pay

## 2024-01-23 NOTE — Telephone Encounter (Signed)
 Per [facility] health Plan they have approved Rinvoq 45 mg once per day then after 12 weeks decrease to 30 mg from 01/22/2024-01/22/2024.

## 2024-01-28 ENCOUNTER — Encounter (INDEPENDENT_AMBULATORY_CARE_PROVIDER_SITE_OTHER): Payer: Self-pay | Admitting: Gastroenterology

## 2024-01-28 ENCOUNTER — Ambulatory Visit (INDEPENDENT_AMBULATORY_CARE_PROVIDER_SITE_OTHER): Admitting: Gastroenterology

## 2024-01-28 VITALS — BP 126/84 | HR 78 | Temp 97.8°F | Ht 66.5 in | Wt 173.3 lb

## 2024-01-28 DIAGNOSIS — K5080 Crohn's disease of both small and large intestine without complications: Secondary | ICD-10-CM | POA: Diagnosis not present

## 2024-01-28 DIAGNOSIS — Z79899 Other long term (current) drug therapy: Secondary | ICD-10-CM

## 2024-01-28 DIAGNOSIS — Z79620 Long term (current) use of immunosuppressive biologic: Secondary | ICD-10-CM

## 2024-01-28 DIAGNOSIS — K219 Gastro-esophageal reflux disease without esophagitis: Secondary | ICD-10-CM

## 2024-01-28 DIAGNOSIS — Z5181 Encounter for therapeutic drug level monitoring: Secondary | ICD-10-CM | POA: Diagnosis not present

## 2024-01-28 DIAGNOSIS — Z13220 Encounter for screening for lipoid disorders: Secondary | ICD-10-CM

## 2024-01-28 NOTE — Progress Notes (Unsigned)
 Gail Fortune, M.D. Gastroenterology & Hepatology [facility] Gastroenterology [address] [place], [place] [address]  Primary Care Physician: Gail Jerel MATSU, MD [address] [place] [address]  I will communicate my assessment and recommendations to the referring MD via EMR.  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 female with past medical history of complex medical history of ileocolonic Crohn's disease status post colon resection x2 (once due to microperforation), GERD, coming for follow-up of Crohn's disease.   The patient was last seen on 11/22/2023. At that time, the patient was continued on Stelara  every 8 weeks.  Stelara  levels were checked which were adequate at 5.4.  CT enterography was performed as part of the surveillance of her Crohn's disease, there was presence of short segment wall thickening in the neoterminal ileum suggestive of ongoing inflammation.  Due to this, I discussed with the patient the possibility of switching her to a different medication -decision was held to start her on Rinvoq  45 mg daily.  This medication was submitted to the specialty pharmacy.  Last dose of Stelara  was in May.  Patient reports that she received today her Rinvoq  prescription  Patient reports that depending on the type of food she eats, she may vary between 1-4 bowel movements per day. She has had significant intermittent bloating with Stelara . May have some mild pain when she eats significant amount of fiber. The patient denies having any nausea, vomiting, fever, chills, hematochezia, melena, hematemesis,  diarrhea, jaundice, pruritus or weight loss.  No heartburn or dysphagia while taking Aciphex  20 mg daily.  Fecal calprotectin was negative on  01/11/2024.  Last flu shot:2024 Last pneumonia shot: 2019 Last evaluation by dermatology: 2024 Last zoster vaccine: 2022 COVID-19 shot:  did the first round and one booster Last time hepatitis B and QuantiFERON were checked: 12/19/2023   Previously used biologicals: Humira , Entyvio , Stelara    Last Colonoscopy:06/2022 Presence of 5 small 3 to 4 mm ulcers consistent with Rutgeers i1.  There was presence of an anastomotic stenosis.  There was evidence of a prior end-to-side site ileocolonic anastomosis in the ascending colon with a stenosis measuring 1 cm x 0.5 cm.  8 mm polyp was removed from the anastomosis (inflammatory polyp), which was subsequently clipped.  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  Past Medical History: Past Medical History:  Diagnosis Date   Abdominal pain 10/18/2010   Anemia    Arthritis    Constipation 10/18/2010   Crohn's disease (HCC) 10/18/2010   SMALL BOWELS   Diarrhea 10/18/2010   Diverticulosis    Heart murmur    ECHO 2022   History of kidney stones    Kidney stone on left side     Past Surgical History: Past Surgical History:  Procedure Laterality Date   BIOPSY  06/02/2020   Procedure: BIOPSY;  Surgeon: Golda Claudis PENNER, MD;  [facility];  Service: Endoscopy;;   BREAST SURGERY Right    8013,8007 Lumpectomy   COLONOSCOPY  10/16/2001   COLONOSCOPY  03/15/2012   Procedure: COLONOSCOPY;  Surgeon: Claudis PENNER Golda, MD;  [facility];  Service: Endoscopy;  Laterality: N/A;  730   COLONOSCOPY WITH PROPOFOL  N/A 06/02/2020   Procedure: COLONOSCOPY WITH PROPOFOL ;  Surgeon: Golda Claudis PENNER, MD;  [facility];  Service: Endoscopy;  Laterality: N/A;  730   COLONOSCOPY WITH PROPOFOL  N/A 07/21/2022   Procedure: COLONOSCOPY WITH PROPOFOL ;  Surgeon:  Eartha Angelia Sieving, MD;  [facility];  Service: Gastroenterology;  Laterality: N/A;  9:45am, asa 1-2   CYSTOSCOPY W/ URETERAL STENT PLACEMENT Left 10/31/2022   Procedure: CYSTOSCOPY WITH RETROGRADE PYELOGRAM/URETERAL STENT PLACEMENT;  Surgeon: Alvaro Hummer, MD;  [facility];  Service: Urology;  Laterality: Left;   CYSTOSCOPY WITH RETROGRADE PYELOGRAM, URETEROSCOPY AND STENT PLACEMENT Left 11/15/2022   Procedure: CYSTOSCOPY WITH RETROGRADE PYELOGRAM, URETEROSCOPY AND STENT PLACEMENT, BASKET STONE REMOVAL;  Surgeon: Alvaro Hummer KATHEE Mickey., MD;  [facility];  Service: Urology;  Laterality: Left;  75 MINS   DILATION AND CURETTAGE OF UTERUS  09/1999   Polyp resection   ESOPHAGEAL DILATION N/A 06/02/2020   Procedure: ESOPHAGEAL DILATION;  Surgeon: Golda Claudis PENNER, MD;  [facility];  Service: Endoscopy;  Laterality: N/A;   ESOPHAGOGASTRODUODENOSCOPY (EGD) WITH PROPOFOL  N/A 06/02/2020   Procedure: ESOPHAGOGASTRODUODENOSCOPY (EGD) WITH PROPOFOL ;  Surgeon: Golda Claudis PENNER, MD;  [facility];  Service: Endoscopy;  Laterality: N/A;   HEMOSTASIS CLIP PLACEMENT  07/21/2022   Procedure: HEMOSTASIS CLIP PLACEMENT;  Surgeon: Eartha Angelia Sieving, MD;  [facility];  Service: Gastroenterology;;   HOLMIUM LASER APPLICATION Left 11/15/2022   Procedure: HOLMIUM LASER APPLICATION;  Surgeon: Alvaro Hummer KATHEE Mickey., MD;  [facility];  Service: Urology;  Laterality: Left;   LAPAROSCOPIC CHOLECYSTECTOMY  12/17/1996   DEMASON   POLYPECTOMY  07/21/2022   Procedure: POLYPECTOMY INTESTINAL;  Surgeon: Eartha Angelia, Sieving, MD;  [facility];  Service: Gastroenterology;;    Family History: Family History  Problem Relation Age of Onset   Heart disease Father    Healthy Sister    Healthy Brother    Healthy Sister    Healthy Daughter    Healthy Son     Social History: Social History   Tobacco Use  Smoking Status Never    Passive exposure: Never  Smokeless Tobacco Never   Social History   Substance and Sexual Activity  Alcohol Use No   Alcohol/week: 0.0 standard drinks of alcohol   Social History   Substance and Sexual Activity  Drug Use No    Allergies: Allergies  Allergen Reactions   Penicillins Hives   Dexilant [Dexlansoprazole] Other (See Comments)    Muscle pain   Nexium [Esomeprazole Magnesium] Nausea Only    Medications: Current Outpatient Medications  Medication Sig Dispense Refill   acetaminophen  (TYLENOL ) 500 MG tablet Take 500 mg by mouth every 6 (six) hours as needed for moderate pain or headache.     Carboxymethylcellul-Glycerin (REFRESH RELIEVA OP) Place 1-2 drops into both eyes 3 (three) times daily as needed (dry/irritated eyes.).     Cholecalciferol (VITAMIN D3) 2000 UNITS TABS Take 2,000 Units by mouth in the morning.     docusate sodium  (COLACE) 100 MG capsule Take 2 capsules (200 mg total) by mouth at bedtime. (Patient taking differently: Take 200 mg by mouth at bedtime. As needed.)  0   Multiple Vitamins-Minerals (MULTIVITAMIN WITH MINERALS) tablet Take 2 tablets by mouth in the morning. forbia     OVER THE COUNTER MEDICATION Vit b complex with c one daily (Patient taking differently: daily at 6 (six) AM. Vit b complex with c  one daily)     RABEprazole  (ACIPHEX ) 20 MG tablet TAKE 1 TABLET BY MOUTH DAILY BEFORE breakfast 90 tablet 2   sodium chloride  (OCEAN) 0.65 % SOLN nasal spray Place 1 spray into both nostrils in the morning and at bedtime.     Upadacitinib  ER (RINVOQ ) 45 MG TB24 Take 1 tablet (45 mg total) by mouth daily at 6 (six) AM. (Patient not taking: Reported on 01/28/2024) 84 tablet 0   No current facility-administered medications for this visit.    Review of Systems: GENERAL: negative for malaise, night sweats HEENT: No changes in hearing or vision, no nose bleeds or other nasal problems. NECK: Negative for lumps, goiter, pain and significant neck  swelling RESPIRATORY: Negative for cough, wheezing CARDIOVASCULAR: Negative for chest pain, leg swelling, palpitations, orthopnea GI: SEE HPI MUSCULOSKELETAL: Negative for joint pain or swelling, back pain, and muscle pain. SKIN: Negative for lesions, rash PSYCH: Negative for sleep disturbance, mood disorder and recent psychosocial stressors. HEMATOLOGY Negative for prolonged bleeding, bruising easily, and swollen nodes. ENDOCRINE: Negative for cold or heat intolerance, polyuria, polydipsia and goiter. NEURO: negative for tremor, gait imbalance, syncope and seizures. The remainder of the review of systems is noncontributory.   Physical Exam: BP 126/84 (BP Location: Left Arm, Patient Position: Sitting, Cuff Size: Normal)   Pulse 78   Temp 97.8 F (36.6 C) (Temporal)   Ht 5' 6.5 (1.689 m)   Wt 173 lb 4.8 oz (78.6 kg)   BMI 27.55 kg/m  GENERAL: The patient is AO x3, in no acute distress. HEENT: Head is normocephalic and atraumatic. EOMI are intact. Mouth is well hydrated and without lesions. NECK: Supple. No masses LUNGS: Clear to auscultation. No presence of rhonchi/wheezing/rales. Adequate chest expansion HEART: RRR, normal s1 and s2. ABDOMEN: Mildly tender open palpation of the lower abdomen, no guarding, no peritoneal signs, and nondistended. BS +. No masses. EXTREMITIES: Without any cyanosis, clubbing, rash, lesions or edema. NEUROLOGIC: AOx3, no focal motor deficit. SKIN: no jaundice, no rashes  Imaging/Labs: as above  I personally reviewed and interpreted the available labs, imaging and endoscopic files.  Impression and Plan: VERLINDA SLOTNICK is a 78 y.o. female with past medical history of complex medical history of ileocolonic Crohn's disease status post colon resection x2 (once due to microperforation), GERD, coming for follow-up of Crohn's disease.   Patient has been treated with different biologicals in the past.  She did not have any improvement with the use of  Humira  and Stelara  has been suboptimally leading to symptom control.  She had better symptom control with Entyvio  but imaging was showing persistent mild active inflammation in the neoterminal ileum.  Due to this, the decision was made to switch her to Rinvoq  which she will be started tomorrow as she just received the shipment of the medication today.  Currently, her symptoms are mildly active.  We discussed that hopefully this will improve with the use of Rinvoq  induction and maintenance doses.  Will need to obtain surveillance labs in a month.  Finally, she has adequate control of her GERD with Aciphex  20 mg daily, which she should continue for now.  -Start Rinvoq  45 mg induction dose - Check CBC, CMP and lipid panel in 1 month -Continue Aciphex  20 mg daily  All questions were answered.      Gail Fortune, MD Gastroenterology and Hepatology [facility] Gastroenterology

## 2024-01-28 NOTE — Patient Instructions (Addendum)
 Start Rinvoq  tomorrow Perform blood workup in 1 month Continue Aciphex  20 mg daily

## 2024-02-29 LAB — LIPID PANEL
Cholesterol: 221 mg/dL — ABNORMAL HIGH (ref <200)
HDL: 73 mg/dL (ref >=50)
LDL Cholesterol (Calc): 118 mg/dL — ABNORMAL HIGH
Non-HDL Cholesterol (Calc): 148 mg/dL — ABNORMAL HIGH (ref <130)
Total CHOL/HDL Ratio: 3 (calc) (ref <5.0)
Triglycerides: 186 mg/dL — ABNORMAL HIGH (ref <150)

## 2024-02-29 LAB — COMPREHENSIVE METABOLIC PANEL WITH GFR
AG Ratio: 1.8 (calc) (ref 1.0–2.5)
ALT: 16 U/L (ref 6–29)
AST: 21 U/L (ref 10–35)
Albumin: 4.4 g/dL (ref 3.6–5.1)
Alkaline phosphatase (APISO): 78 U/L (ref 37–153)
BUN: 14 mg/dL (ref 7–25)
CO2: 29 mmol/L (ref 20–32)
Calcium: 9.5 mg/dL (ref 8.6–10.4)
Chloride: 105 mmol/L (ref 98–110)
Creat: 0.67 mg/dL (ref 0.60–1.00)
Globulin: 2.4 g/dL (ref 1.9–3.7)
Glucose, Bld: 78 mg/dL (ref 65–139)
Potassium: 3.9 mmol/L (ref 3.5–5.3)
Sodium: 140 mmol/L (ref 135–146)
Total Bilirubin: 0.6 mg/dL (ref 0.2–1.2)
Total Protein: 6.8 g/dL (ref 6.1–8.1)
eGFR: 91 mL/min/1.73m2 (ref >=60)

## 2024-02-29 LAB — CBC WITH DIFFERENTIAL/PLATELET
Absolute Lymphocytes: 2373 {cells}/uL (ref 850–3900)
Absolute Monocytes: 390 {cells}/uL (ref 200–950)
Basophils Absolute: 31 {cells}/uL (ref 0–200)
Basophils Relative: 0.5 %
Eosinophils Absolute: 43 {cells}/uL (ref 15–500)
Eosinophils Relative: 0.7 %
HCT: 38.9 % (ref 35.0–45.0)
Hemoglobin: 12.7 g/dL (ref 11.7–15.5)
MCH: 30.1 pg (ref 27.0–33.0)
MCHC: 32.6 g/dL (ref 32.0–36.0)
MCV: 92.2 fL (ref 80.0–100.0)
MPV: 9.7 fL (ref 7.5–12.5)
Monocytes Relative: 6.4 %
Neutro Abs: 3264 {cells}/uL (ref 1500–7800)
Neutrophils Relative %: 53.5 %
Platelets: 275 Thousand/uL (ref 140–400)
RBC: 4.22 Million/uL (ref 3.80–5.10)
RDW: 14.1 % (ref 11.0–15.0)
Total Lymphocyte: 38.9 %
WBC: 6.1 Thousand/uL (ref 3.8–10.8)

## 2024-03-04 ENCOUNTER — Ambulatory Visit: Payer: Self-pay | Admitting: Gastroenterology

## 2024-03-28 ENCOUNTER — Other Ambulatory Visit: Payer: Self-pay | Admitting: Gastroenterology

## 2024-03-28 ENCOUNTER — Other Ambulatory Visit (INDEPENDENT_AMBULATORY_CARE_PROVIDER_SITE_OTHER): Payer: Self-pay | Admitting: Gastroenterology

## 2024-03-28 DIAGNOSIS — K5080 Crohn's disease of both small and large intestine without complications: Secondary | ICD-10-CM

## 2024-03-28 MED ORDER — RINVOQ 30 MG PO TB24: 1.0000 | ORAL_TABLET | Freq: Every day | ORAL | 3 refills | Status: DC

## 2024-03-28 NOTE — Telephone Encounter (Signed)
 This medication will not be refilled at this dosage, I will send a new prescription for the maintenance dose of 30 mg daily

## 2024-04-01 ENCOUNTER — Encounter (INDEPENDENT_AMBULATORY_CARE_PROVIDER_SITE_OTHER): Payer: Self-pay

## 2024-04-01 ENCOUNTER — Other Ambulatory Visit (INDEPENDENT_AMBULATORY_CARE_PROVIDER_SITE_OTHER): Payer: Self-pay

## 2024-04-21 ENCOUNTER — Encounter (INDEPENDENT_AMBULATORY_CARE_PROVIDER_SITE_OTHER): Payer: Self-pay | Admitting: Gastroenterology

## 2024-04-21 ENCOUNTER — Ambulatory Visit (INDEPENDENT_AMBULATORY_CARE_PROVIDER_SITE_OTHER): Admitting: Gastroenterology

## 2024-04-21 VITALS — BP 146/83 | HR 74 | Temp 97.9°F | Ht 66.5 in | Wt 175.3 lb

## 2024-04-21 DIAGNOSIS — K219 Gastro-esophageal reflux disease without esophagitis: Secondary | ICD-10-CM

## 2024-04-21 DIAGNOSIS — L709 Acne, unspecified: Secondary | ICD-10-CM

## 2024-04-21 DIAGNOSIS — K5080 Crohn's disease of both small and large intestine without complications: Secondary | ICD-10-CM | POA: Diagnosis not present

## 2024-04-21 MED ORDER — CLINDAMYCIN PHOS-BENZOYL PEROX 1-5 % EX GEL: Freq: Two times a day (BID) | CUTANEOUS | 0 refills | Status: AC

## 2024-04-21 NOTE — Progress Notes (Unsigned)
 Toribio Fortune, M.D. Gastroenterology & Hepatology [facility] Gastroenterology [address] [place], [place] [address]  Primary Care Physician: Toribio Jerel MATSU, MD [address] [place] [address]  I will communicate my assessment and recommendations to the referring MD via EMR.  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 female with past medical history of complex medical history of ileocolonic Crohn's disease status post colon resection x2 (once due to microperforation), GERD, coming for follow-up of Crohn's disease.   The patient was last seen on 01/28/24. At that time, the patient was started on Rinviq and continued on Aciphex  20 mg.  Patient reports she has had very mild abdominal pain in her abdomen a couple of times a day. She may have fluctuation in her bowel movements as she may have 1-3 Bms depending on how much fiber she is taking. She has cut down her coffee as this causes watery Bms. She has not seen the stool is very formed. However, she fels the stools are more formed compared with Stelara .  She has felt some more chills but no fever while on Rinvoq .  She is supposed to decrease her Rinvoq  to 30 mg tomorrow.  Has noticed some acne recently, for which she is using a retinoin cream. Has felt some chest pain tyesterday when she was working out, but this subsided.  Denies any heartburn while on Aciphex .  The patient denies having any nausea, vomiting, fever, chills, hematochezia, melena, hematemesis, abdominal distention, abdominal pain, diarrhea, jaundice, pruritus or weight loss.  Fecal calprotectin was negative on 01/11/2024.   Last flu shot:2024 Last pneumonia shot: 2019 Last evaluation by dermatology: 2024 Last RSV shot:  2024 Last zoster vaccine: 2022 COVID-19 shot:  did the first round and one booster Last time hepatitis B and QuantiFERON were checked: 12/19/2023   Previously used biologicals: Humira , Entyvio , Stelara    Last Colonoscopy:06/2022 Presence of 5 small 3 to 4 mm ulcers consistent with Rutgeers i1.  There was presence of an anastomotic stenosis.  There was evidence of a prior end-to-side site ileocolonic anastomosis in the ascending colon with a stenosis measuring 1 cm x 0.5 cm.  8 mm polyp was removed from the anastomosis (inflammatory polyp), which was subsequently clipped.  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, nonbleeding hemorrhoids.oscopy:  Past Medical History: Past Medical History:  Diagnosis Date   Abdominal pain 10/18/2010   Anemia    Arthritis    Constipation 10/18/2010   Crohn's disease (HCC) 10/18/2010   SMALL BOWELS   Diarrhea 10/18/2010   Diverticulosis    Heart murmur    ECHO 2022   History of kidney stones    Kidney stone on left side     Past Surgical History: Past Surgical History:  Procedure Laterality Date   BIOPSY  06/02/2020   Procedure: BIOPSY;  Surgeon: Golda Claudis PENNER, MD;  [facility];  Service: Endoscopy;;   BREAST SURGERY Right    8013,8007 Lumpectomy   COLONOSCOPY  10/16/2001   COLONOSCOPY  03/15/2012   Procedure: COLONOSCOPY;  Surgeon: Claudis PENNER Golda, MD;  [facility];  Service: Endoscopy;  Laterality: N/A;  730  COLONOSCOPY WITH PROPOFOL  N/A 06/02/2020   Procedure: COLONOSCOPY WITH PROPOFOL ;  Surgeon: Golda Claudis PENNER, MD;  [facility];  Service: Endoscopy;  Laterality: N/A;  730   COLONOSCOPY WITH PROPOFOL  N/A 07/21/2022   Procedure: COLONOSCOPY WITH PROPOFOL ;  Surgeon: Eartha Angelia Sieving, MD;  [facility];  Service: Gastroenterology;   Laterality: N/A;  9:45am, asa 1-2   CYSTOSCOPY W/ URETERAL STENT PLACEMENT Left 10/31/2022   Procedure: CYSTOSCOPY WITH RETROGRADE PYELOGRAM/URETERAL STENT PLACEMENT;  Surgeon: Alvaro Hummer, MD;  [facility];  Service: Urology;  Laterality: Left;   CYSTOSCOPY WITH RETROGRADE PYELOGRAM, URETEROSCOPY AND STENT PLACEMENT Left 11/15/2022   Procedure: CYSTOSCOPY WITH RETROGRADE PYELOGRAM, URETEROSCOPY AND STENT PLACEMENT, BASKET STONE REMOVAL;  Surgeon: Alvaro Hummer KATHEE Mickey., MD;  [facility];  Service: Urology;  Laterality: Left;  75 MINS   DILATION AND CURETTAGE OF UTERUS  09/1999   Polyp resection   ESOPHAGEAL DILATION N/A 06/02/2020   Procedure: ESOPHAGEAL DILATION;  Surgeon: Golda Claudis PENNER, MD;  [facility];  Service: Endoscopy;  Laterality: N/A;   ESOPHAGOGASTRODUODENOSCOPY (EGD) WITH PROPOFOL  N/A 06/02/2020   Procedure: ESOPHAGOGASTRODUODENOSCOPY (EGD) WITH PROPOFOL ;  Surgeon: Golda Claudis PENNER, MD;  [facility];  Service: Endoscopy;  Laterality: N/A;   HEMOSTASIS CLIP PLACEMENT  07/21/2022   Procedure: HEMOSTASIS CLIP PLACEMENT;  Surgeon: Eartha Angelia Sieving, MD;  [facility];  Service: Gastroenterology;;   HOLMIUM LASER APPLICATION Left 11/15/2022   Procedure: HOLMIUM LASER APPLICATION;  Surgeon: Alvaro Hummer KATHEE Mickey., MD;  [facility];  Service: Urology;  Laterality: Left;   LAPAROSCOPIC CHOLECYSTECTOMY  12/17/1996   DEMASON   POLYPECTOMY  07/21/2022   Procedure: POLYPECTOMY INTESTINAL;  Surgeon: Eartha Angelia, Sieving, MD;  [facility];  Service: Gastroenterology;;    Family History: Family History  Problem Relation Age of Onset   Heart disease Father    Healthy Sister    Healthy Brother    Healthy Sister    Healthy Daughter    Healthy Son     Social History: Social History   Tobacco Use  Smoking Status Never   Passive exposure: Never  Smokeless Tobacco Never   Social History   Substance and  Sexual Activity  Alcohol Use No   Alcohol/week: 0.0 standard drinks of alcohol   Social History   Substance and Sexual Activity  Drug Use No    Allergies: Allergies  Allergen Reactions   Penicillins Hives   Dexilant [Dexlansoprazole] Other (See Comments)    Muscle pain   Nexium [Esomeprazole Magnesium] Nausea Only    Medications: Current Outpatient Medications  Medication Sig Dispense Refill   acetaminophen  (TYLENOL ) 500 MG tablet Take 500 mg by mouth every 6 (six) hours as needed for moderate pain or headache.     Carboxymethylcellul-Glycerin (REFRESH RELIEVA OP) Place 1-2 drops into both eyes 3 (three) times daily as needed (dry/irritated eyes.).     Cholecalciferol (VITAMIN D3) 2000 UNITS TABS Take 2,000 Units by mouth in the morning.     docusate sodium  (COLACE) 100 MG capsule Take 2 capsules (200 mg total) by mouth at bedtime. (Patient taking differently: Take 200 mg by mouth as needed.)  0   Multiple Vitamins-Minerals (MULTIVITAMIN WITH MINERALS) tablet Take 2 tablets by mouth in the morning. forbia     OVER THE COUNTER MEDICATION Vit b complex with c one daily (Patient taking differently: daily at 6 (six) AM. Vit b complex with c one daily)  RABEprazole  (ACIPHEX ) 20 MG tablet TAKE 1 TABLET BY MOUTH DAILY BEFORE breakfast 90 tablet 2   sodium chloride  (OCEAN) 0.65 % SOLN nasal spray Place 1 spray into both nostrils in the morning and at bedtime.     TRETINOIN EX Apply topically. 3% for acne.     Upadacitinib  ER (RINVOQ ) 30 MG TB24 Take 1 tablet (30 mg total) by mouth daily. (Patient not taking: Reported on 04/21/2024) 90 tablet 3   No current facility-administered medications for this visit.    Review of Systems: GENERAL: negative for malaise, night sweats HEENT: No changes in hearing or vision, no nose bleeds or other nasal problems. NECK: Negative for lumps, goiter, pain and significant neck swelling RESPIRATORY: Negative for cough, wheezing CARDIOVASCULAR: Negative  for chest pain, leg swelling, palpitations, orthopnea GI: SEE HPI MUSCULOSKELETAL: Negative for joint pain or swelling, back pain, and muscle pain. SKIN: Negative for lesions, rash PSYCH: Negative for sleep disturbance, mood disorder and recent psychosocial stressors. HEMATOLOGY Negative for prolonged bleeding, bruising easily, and swollen nodes. ENDOCRINE: Negative for cold or heat intolerance, polyuria, polydipsia and goiter. NEURO: negative for tremor, gait imbalance, syncope and seizures. The remainder of the review of systems is noncontributory.   Physical Exam: BP (!) 146/83 (BP Location: Left Arm, Patient Position: Sitting, Cuff Size: Normal)   Pulse 74   Temp 97.9 F (36.6 C) (Oral)   Ht 5' 6.5 (1.689 m)   Wt 175 lb 4.8 oz (79.5 kg)   BMI 27.87 kg/m  GENERAL: The patient is AO x3, in no acute distress. HEENT: Head is normocephalic and atraumatic. EOMI are intact. Mouth is well hydrated and without lesions. NECK: Supple. No masses LUNGS: Clear to auscultation. No presence of rhonchi/wheezing/rales. Adequate chest expansion HEART: RRR, normal s1 and s2. ABDOMEN: Soft, nontender, no guarding, no peritoneal signs, and nondistended. BS +. No masses. RECTAL EXAM: no external lesions, normal tone, no masses, brown stool without blood.*** Chaperone: EXTREMITIES: Without any cyanosis, clubbing, rash, lesions or edema. NEUROLOGIC: AOx3, no focal motor deficit. SKIN: no jaundice, no rashes  Imaging/Labs: as above  I personally reviewed and interpreted the available labs, imaging and endoscopic files.  Impression and Plan: CLOTILE WHITTINGTON is a 78 y.o. female coming for follow up of ***   All questions were answered.      Toribio Fortune, MD Gastroenterology and Hepatology [facility] Gastroenterology

## 2024-04-21 NOTE — Patient Instructions (Signed)
-   Decrease Rinvoq  30 mg daily starting tomorrow - Check fecal calprotectin -Continue Aciphex  20 mg daily Get flu shot this year Follow-up with dermatology Stop tretinoin for now and use topical clindamycin  instead for management of skin abnormalities

## 2024-05-03 LAB — CALPROTECTIN: Calprotectin: 105 ug/g

## 2024-05-05 ENCOUNTER — Ambulatory Visit (INDEPENDENT_AMBULATORY_CARE_PROVIDER_SITE_OTHER): Payer: Self-pay | Admitting: Gastroenterology

## 2024-05-14 ENCOUNTER — Encounter (INDEPENDENT_AMBULATORY_CARE_PROVIDER_SITE_OTHER): Payer: Self-pay | Admitting: Gastroenterology

## 2024-05-30 ENCOUNTER — Encounter (INDEPENDENT_AMBULATORY_CARE_PROVIDER_SITE_OTHER): Payer: Self-pay | Admitting: Gastroenterology

## 2024-06-18 ENCOUNTER — Encounter (INDEPENDENT_AMBULATORY_CARE_PROVIDER_SITE_OTHER): Payer: Self-pay | Admitting: *Deleted

## 2024-06-23 ENCOUNTER — Telehealth (INDEPENDENT_AMBULATORY_CARE_PROVIDER_SITE_OTHER): Payer: Self-pay

## 2024-06-23 ENCOUNTER — Other Ambulatory Visit (INDEPENDENT_AMBULATORY_CARE_PROVIDER_SITE_OTHER): Payer: Self-pay | Admitting: Gastroenterology

## 2024-06-23 NOTE — Telephone Encounter (Signed)
 Spoke to patient regarding her symptom control.  She reports that she is having up to 5 bowel movements per day and she states that she has not noticed a major difference with the use of Rinvoq .  She states that she was feeling much better when she was on Entyvio  every 8 weeks, although imaging showed there was presence of some persistent mild inflammation in the neoterminal ileum.  I discussed with the patient that the fact she was seen pills in her stool meant likely ineffective absorption and very likely that the medication was presenting mechanistic failure.  Due to this and given the fact she had such significant improvement with Entyvio , we will try again this medication -I will prescribe regular induction for her but now she will continue every 6 weeks as she may need higher concentration of the medication to achieve mucosal improvement.  Patient understood and agreed.  Orders placed.  Patient was advised to take her last dose of Rinvoq  the day before her first Entyvio  dose.

## 2024-06-23 NOTE — Telephone Encounter (Signed)
 Patient called today stating she has noticed that she is passing her Rinvoq  in her stools. She has seen it in her stools 4 out of 7 bm's. She wanted to let us  know this as she feels if she is seeing it in the stools, it can not be working or absorbed as it should be. Patient does report she is due for a refill in December, but did not want to order it incase we needed to change it to something else. She reports She has 2-3 loose stools per day. Has occasional mucus in stools. Denies any dark or bloody stools, or abdominal pain.   Please advise.

## 2024-06-24 ENCOUNTER — Other Ambulatory Visit (HOSPITAL_COMMUNITY): Payer: Self-pay | Admitting: Gastroenterology

## 2024-07-01 NOTE — Telephone Encounter (Signed)
 I have sent a message to Gail Henry, at [facility], that I have sent her the information.

## 2024-07-01 NOTE — Telephone Encounter (Signed)
 Per Dr. Eartha, patient is sticking with the IV Entyvio  and will not be switching to Entyvio  shots.

## 2024-07-01 NOTE — Telephone Encounter (Signed)
 PA information received from [place], I have forwarded this to the [facility]. Fax confirmation below.    This message was sent via FAXCOM, a product from [employer]. http://www.biscom.com/                    -------Fax Transmission Report-------  To:               Recipient at [phone] Subject:          [employer] PA questions for [facility]. Result:           The transmission was successful. Explanation:      All Pages Ok Pages Sent:       6 Connect Time:     2 minutes, 43 seconds Transmit Time:    07/01/2024 13:37 Transfer Rate:    [id number] Status Code:      0000 Retry Count:      0 Job Id:           [id number] Unique Id:        [id number] Fax Line:         31 Fax Server:       [id number]

## 2024-07-02 ENCOUNTER — Telehealth: Payer: Self-pay

## 2024-07-02 NOTE — Telephone Encounter (Signed)
 Auth Submission: NO AUTH NEEDED Site of care: Site of care: [facility] Payer: medicare a/b Medication & CPT/J Code(s) submitted: Entyvio  (Vedolizumab ) J3380 Diagnosis Code:  Route of submission (phone, fax, portal): portal Phone # Fax # Auth type: Buy/Bill HB Units/visits requested: 300mg , initial doses then q6weeks Reference number:  Approval from: 06/30/24 to 06/29/25      Auth Submission: APPROVED Site of care: Site of care: [facility] Payer: aetna state Medication & CPT/J Code(s) submitted: Entyvio  (Vedolizumab ) J3380 Diagnosis Code:  Route of submission (phone, fax, portal): portal Phone # Fax # Auth type: Buy/Bill PB Units/visits requested: 300mg , initial doses then q6weeks Reference number: [id number] Approval from: 06/30/24 to 06/29/25

## 2024-07-08 ENCOUNTER — Encounter: Admitting: *Deleted

## 2024-07-08 VITALS — BP 116/76 | HR 71 | Temp 98.1°F | Resp 16

## 2024-07-08 DIAGNOSIS — K50019 Crohn's disease of small intestine with unspecified complications: Secondary | ICD-10-CM | POA: Diagnosis present

## 2024-07-08 MED ORDER — VEDOLIZUMAB 300 MG IV SOLR
300.0000 mg | Freq: Once | INTRAVENOUS | Status: AC
  Administered 2024-07-08: 300 mg via INTRAVENOUS
  Filled 2024-07-08: qty 5

## 2024-07-08 NOTE — Progress Notes (Signed)
 Diagnosis: ,Crohn's Disease  Provider:  Eartha Sieving MD  Procedure: IV Infusion  IV Type: Peripheral, IV Location: L Hand Entyvio  (Vedolizumab ), Dose: 300 mg  Infusion Start Time: 0933  Infusion Stop Time: 1025  Post Infusion IV Care: Observation period completed  Discharge: Condition: Good, Destination: Home . AVS Provided  Performed by:  Baldwin Darice Helling, RN

## 2024-07-18 ENCOUNTER — Ambulatory Visit (INDEPENDENT_AMBULATORY_CARE_PROVIDER_SITE_OTHER): Admitting: Gastroenterology

## 2024-07-18 ENCOUNTER — Encounter (INDEPENDENT_AMBULATORY_CARE_PROVIDER_SITE_OTHER): Payer: Self-pay | Admitting: Gastroenterology

## 2024-07-18 VITALS — BP 122/80 | HR 79 | Temp 97.8°F | Ht 66.5 in | Wt 182.9 lb

## 2024-07-18 DIAGNOSIS — K219 Gastro-esophageal reflux disease without esophagitis: Secondary | ICD-10-CM | POA: Diagnosis not present

## 2024-07-18 DIAGNOSIS — K5080 Crohn's disease of both small and large intestine without complications: Secondary | ICD-10-CM | POA: Diagnosis not present

## 2024-07-18 DIAGNOSIS — Z9049 Acquired absence of other specified parts of digestive tract: Secondary | ICD-10-CM | POA: Diagnosis not present

## 2024-07-18 DIAGNOSIS — K121 Other forms of stomatitis: Secondary | ICD-10-CM

## 2024-07-18 DIAGNOSIS — K56699 Other intestinal obstruction unspecified as to partial versus complete obstruction: Secondary | ICD-10-CM

## 2024-07-18 NOTE — Progress Notes (Signed)
 Toribio Fortune, M.D. Gastroenterology & Hepatology [facility] Gastroenterology [address] [place], [place] [address]  Primary Care Physician: Toribio Jerel MATSU, MD [address] [place] [address]  I will communicate my assessment and recommendations to the referring MD via EMR.  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 female with past medical history of complex medical history of ileocolonic Crohn's disease status post colon resection x2 (once due to microperforation), GERD, coming for follow-up of Crohn's disease.   The patient was last seen on 04/21/2024. At that time, the patient was advised to decrease Rinvoq  to 30 mg daily.  Also advised to get flu shot and follow-up with dermatology.  She was also given advice regarding topical clindamycin  for acne related to Rinvoq  use.  Patient was concerned about having bowel movements where she saw the Rinvoq  medication, which was concerning for poor absorption.  Due to this, decision was made to switch her back to Entyvio  as she was feeling much better on this medication in the past.  Patient has only received one dose of Entyvio  loading dose so far.  States feeling ok, although she is still having watery Bms between 2-5 times per day. No melena or hematochezia. No abdominal pain.   She endorses having issues with some ulcers in her mouth for the last couple of weeks, around the cheeks area. States she has had issues with regurgitation and heartburn recently.  Has had Aciphex  20 mg daily.  Has been using Magic mouthwash as needed to improve her oral ulcers.  The patient denies having any nausea, vomiting, fever, chills, hematochezia, melena, hematemesis, abdominal distention, abdominal pain, jaundice,  pruritus . Gained some weight when she was on Rinvoq .  Fecal calprotectin was negative on 04/29/2024, although slightly higher than before a level of 105.   Last flu shot:2025 Last pneumonia shot: 2019 Last evaluation by dermatology: 2024 Last RSV shot: 2024 Last zoster vaccine: 2022 COVID-19 shot:  did the first round and one booster Last time hepatitis B and QuantiFERON were checked: 12/19/2023   Previously used biologicals: Rinvoq , Humira , Entyvio , Stelara    Last Colonoscopy:06/2022 Presence of 5 small 3 to 4 mm ulcers consistent with Rutgeers i1.  There was presence of an anastomotic stenosis.  There was evidence of a prior end-to-side site ileocolonic anastomosis in the ascending colon with a stenosis measuring 1 cm x 0.5 cm.  8 mm polyp was removed from the anastomosis (inflammatory polyp), which was subsequently clipped.  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  Past Medical History: Past Medical History:  Diagnosis Date   Abdominal pain 10/18/2010   Anemia    Arthritis    Constipation 10/18/2010   Crohn's disease (HCC) 10/18/2010   SMALL BOWELS   Diarrhea 10/18/2010   Diverticulosis    Heart murmur    ECHO 2022   History of kidney stones    Kidney stone on left side     Past Surgical History: Past Surgical History:  Procedure Laterality Date   BIOPSY  06/02/2020   Procedure: BIOPSY;  Surgeon: Golda Claudis PENNER, MD;  [facility];  Service: Endoscopy;;   BREAST SURGERY  Right    [id number] Lumpectomy   COLONOSCOPY  10/16/2001   COLONOSCOPY  03/15/2012   Procedure: COLONOSCOPY;  Surgeon: Claudis RAYMOND Rivet, MD;  [facility];  Service: Endoscopy;  Laterality: N/A;  730   COLONOSCOPY WITH PROPOFOL  N/A 06/02/2020   Procedure: COLONOSCOPY WITH PROPOFOL ;  Surgeon: Rivet Claudis RAYMOND, MD;  [facility];  Service: Endoscopy;  Laterality: N/A;  730   COLONOSCOPY WITH PROPOFOL  N/A 07/21/2022   Procedure: COLONOSCOPY WITH PROPOFOL ;  Surgeon: Eartha Angelia Sieving, MD;  [facility];  Service: Gastroenterology;  Laterality: N/A;  9:45am, asa 1-2   CYSTOSCOPY W/ URETERAL STENT PLACEMENT Left 10/31/2022   Procedure: CYSTOSCOPY WITH RETROGRADE PYELOGRAM/URETERAL STENT PLACEMENT;  Surgeon: Alvaro Hummer, MD;  Location: [facility];  Service: Urology;  Laterality: Left;   CYSTOSCOPY WITH RETROGRADE PYELOGRAM, URETEROSCOPY AND STENT PLACEMENT Left 11/15/2022   Procedure: CYSTOSCOPY WITH RETROGRADE PYELOGRAM, URETEROSCOPY AND STENT PLACEMENT, BASKET STONE REMOVAL;  Surgeon: Alvaro Hummer KATHEE Mickey., MD;  Location: [facility];  Service: Urology;  Laterality: Left;  75 MINS   DILATION AND CURETTAGE OF UTERUS  09/1999   Polyp resection   ESOPHAGEAL DILATION N/A 06/02/2020   Procedure: ESOPHAGEAL DILATION;  Surgeon: Rivet Claudis RAYMOND, MD;  [facility];  Service: Endoscopy;  Laterality: N/A;   ESOPHAGOGASTRODUODENOSCOPY (EGD) WITH PROPOFOL  N/A 06/02/2020   Procedure: ESOPHAGOGASTRODUODENOSCOPY (EGD) WITH PROPOFOL ;  Surgeon: Rivet Claudis RAYMOND, MD;  [facility];  Service: Endoscopy;  Laterality: N/A;   HEMOSTASIS CLIP PLACEMENT  07/21/2022   Procedure: HEMOSTASIS CLIP PLACEMENT;  Surgeon: Eartha Angelia Sieving, MD;  [facility];  Service: Gastroenterology;;   HOLMIUM LASER APPLICATION Left 11/15/2022   Procedure: HOLMIUM LASER APPLICATION;  Surgeon: Alvaro Hummer KATHEE Mickey., MD;  Location: [facility];  Service: Urology;  Laterality: Left;   LAPAROSCOPIC CHOLECYSTECTOMY  12/17/1996   DEMASON   POLYPECTOMY  07/21/2022   Procedure: POLYPECTOMY INTESTINAL;  Surgeon: Eartha Angelia Sieving, MD;  [facility];  Service: Gastroenterology;;    Family History: Family History  Problem Relation Age of Onset   Heart disease Father     Healthy Sister    Healthy Brother    Healthy Sister    Healthy Daughter    Healthy Son     Social History:Tobacco Use History[1] Social History   Substance and Sexual Activity  Alcohol Use No   Alcohol/week: 0.0 standard drinks of alcohol   Social History   Substance and Sexual Activity  Drug Use No    Allergies: Allergies[2]  Medications: Current Outpatient Medications  Medication Sig Dispense Refill   acetaminophen  (TYLENOL ) 500 MG tablet Take 500 mg by mouth every 6 (six) hours as needed for moderate pain or headache.     Carboxymethylcellul-Glycerin (REFRESH RELIEVA OP) Place 1-2 drops into both eyes 3 (three) times daily as needed (dry/irritated eyes.).     Cholecalciferol (VITAMIN D3) 2000 UNITS TABS Take 2,000 Units by mouth in the morning.     clindamycin -benzoyl peroxide (BENZACLIN) gel Apply topically 2 (two) times daily. (Patient taking differently: Apply topically 2 (two) times daily. As needed) 25 g 0   docusate sodium  (COLACE) 100 MG capsule Take 2 capsules (200 mg total) by mouth at bedtime. (Patient taking differently: Take 200 mg by mouth as needed.)  0   Multiple Vitamins-Minerals (MULTIVITAMIN WITH MINERALS) tablet Take 2 tablets by mouth in the morning. forbia     OVER THE COUNTER MEDICATION Vit b complex with c  one daily (Patient taking differently: daily at 6 (six) AM. Vit b complex with c one daily)     RABEprazole  (ACIPHEX ) 20 MG tablet TAKE 1 TABLET BY MOUTH DAILY BEFORE breakfast 90 tablet 2   sodium chloride  (OCEAN) 0.65 % SOLN nasal spray Place 1 spray into both nostrils in the morning and at bedtime.     Vedolizumab  (ENTYVIO  IV) Inject into the vein. Every six weeks.     TRETINOIN EX Apply topically. 3% for acne. (Patient not taking: Reported on 07/18/2024)     No current facility-administered medications for this visit.    Review of Systems: GENERAL: negative for malaise, night sweats HEENT: No changes in hearing or vision, no nose bleeds or  other nasal problems. NECK: Negative for lumps, goiter, pain and significant neck swelling RESPIRATORY: Negative for cough, wheezing CARDIOVASCULAR: Negative for chest pain, leg swelling, palpitations, orthopnea GI: SEE HPI MUSCULOSKELETAL: Negative for joint pain or swelling, back pain, and muscle pain. SKIN: Negative for lesions, rash PSYCH: Negative for sleep disturbance, mood disorder and recent psychosocial stressors. HEMATOLOGY Negative for prolonged bleeding, bruising easily, and swollen nodes. ENDOCRINE: Negative for cold or heat intolerance, polyuria, polydipsia and goiter. NEURO: negative for tremor, gait imbalance, syncope and seizures. The remainder of the review of systems is noncontributory.   Physical Exam: BP 122/80 (BP Location: Left Arm, Patient Position: Sitting, Cuff Size: Large)   Pulse 79   Temp 97.8 F (36.6 C) (Temporal)   Ht 5' 6.5 (1.689 m)   Wt 182 lb 14.4 oz (83 kg)   BMI 29.08 kg/m  GENERAL: The patient is AO x3, in no acute distress. HEENT: Head is normocephalic and atraumatic. EOMI are intact. Mouth is well hydrated.  Has presence of a pinpoint erythematous area in the right internal area of the cheek. NECK: Supple. No masses LUNGS: Clear to auscultation. No presence of rhonchi/wheezing/rales. Adequate chest expansion HEART: RRR, normal s1 and s2. ABDOMEN: Soft, nontender, no guarding, no peritoneal signs, and nondistended. BS +. No masses. EXTREMITIES: Without any cyanosis, clubbing, rash, lesions or edema. NEUROLOGIC: AOx3, no focal motor deficit. SKIN: no jaundice, no rashes  Imaging/Labs: as above  I personally reviewed and interpreted the available labs, imaging and endoscopic files.  Impression and Plan: NORVELLA LOSCALZO is a 78 y.o. female with past medical history of complex medical history of ileocolonic Crohn's disease status post colon resection x2 (once due to microperforation), GERD, coming for follow-up of Crohn's disease.  Patient  has had a complex history of Crohn's disease, which she has had complications including perforation of bowel that have required resection of her colon.  She has been on different biologicals in the past, with the best response with Entyvio .  More recently she was on Rinvoq  but developed some side effects without significant response and it appears she was not absorbing the medication adequately.  Due to this, she was recently switched back to Entyvio  and has received only 1 dose.  She is still symptomatic, which is not unexpected given the half-life for Entyvio .  Will follow her clinical progress after receiving the induction doses and continuing the medication at every 6-week interval.  For now, she can take Imodium as needed to improve her diarrhea issues.  We will check surveillance labs after her next infusion.  Notably, it appears that she presented some oral ulcerations in the past, which I considered were likely related to active Crohn's disease in the setting of Rinvoq  malabsorption.  Reassuringly, these lesions appeared to  have regressed as there is a very subtle lesion in physical exam.  She should continue with Entyvio  and we will assess her clinical response to this.  She can take Magic mouthwash as needed.  I doubt that her oral ulcers were related to GERD.  She needs to continue Aciphex  at the same dosage.  -Check CBC, CMP and CRP a week after your next infusion dose -Continue Entyvio  induction and maintenance every 6 weeks -Can continue using Magic mouthwash for mouth ulcers -Continue Aciphex  20 milligrams daily. - May consider repeat colonoscopy 9 months after first dose of Entyvio , pending clinical response.  All questions were answered.      Toribio Fortune, MD Gastroenterology and Hepatology [facility] Gastroenterology     [1]  Social History Tobacco Use  Smoking Status Never   Passive exposure: Never  Smokeless Tobacco Never  [2]  Allergies Allergen  Reactions   Penicillins Hives   Dexilant [Dexlansoprazole] Other (See Comments)    Muscle pain   Nexium [Esomeprazole Magnesium] Nausea Only

## 2024-07-18 NOTE — Patient Instructions (Addendum)
 Perform blood workup a week after your next infusion dose Continue Entyvio  induction and maintenance every 6 weeks Can continue using Magic mouthwash for mouth ulcers Continue Aciphex  and milligrams daily.

## 2024-07-22 ENCOUNTER — Ambulatory Visit

## 2024-07-22 VITALS — BP 130/76 | HR 66 | Temp 97.9°F | Resp 15

## 2024-07-22 DIAGNOSIS — K50019 Crohn's disease of small intestine with unspecified complications: Secondary | ICD-10-CM

## 2024-07-22 MED ORDER — VEDOLIZUMAB 300 MG IV SOLR
300.0000 mg | Freq: Once | INTRAVENOUS | Status: AC
  Administered 2024-07-22: 300 mg via INTRAVENOUS
  Filled 2024-07-22: qty 5

## 2024-07-22 NOTE — Progress Notes (Signed)
 Diagnosis: Crohn's Disease  Provider:  Eartha Sieving MD  Procedure: IV Infusion  IV Type: Peripheral, IV Location: L Antecubital  Entyvio  (Vedolizumab ), Dose: 300 mg  Infusion Start Time: 1021  Infusion Stop Time: 1058  Post Infusion IV Care: Peripheral IV Discontinued  Discharge: Condition: Good, Destination: Home . AVS Declined  Performed by:  Delon ONEIDA Officer, RN

## 2024-07-30 LAB — CBC WITH DIFFERENTIAL/PLATELET
Absolute Lymphocytes: 1475 {cells}/uL (ref 850–3900)
Absolute Monocytes: 490 {cells}/uL (ref 200–950)
Basophils Absolute: 30 {cells}/uL (ref 0–200)
Basophils Relative: 0.5 %
Eosinophils Absolute: 47 {cells}/uL (ref 15–500)
Eosinophils Relative: 0.8 %
HCT: 36.7 % (ref 35.9–46.0)
Hemoglobin: 12.2 g/dL (ref 11.7–15.5)
MCH: 31 pg (ref 27.0–33.0)
MCHC: 33.2 g/dL (ref 31.6–35.4)
MCV: 93.4 fL (ref 81.4–101.7)
MPV: 9.8 fL (ref 7.5–12.5)
Monocytes Relative: 8.3 %
Neutro Abs: 3859 {cells}/uL (ref 1500–7800)
Neutrophils Relative %: 65.4 %
Platelets: 332 Thousand/uL (ref 140–400)
RBC: 3.93 Million/uL (ref 3.80–5.10)
RDW: 12 % (ref 11.0–15.0)
Total Lymphocyte: 25 %
WBC: 5.9 Thousand/uL (ref 3.8–10.8)

## 2024-07-30 LAB — COMPREHENSIVE METABOLIC PANEL WITH GFR
AG Ratio: 1.5 (calc) (ref 1.0–2.5)
ALT: 15 U/L (ref 6–29)
AST: 19 U/L (ref 10–35)
Albumin: 3.9 g/dL (ref 3.6–5.1)
Alkaline phosphatase (APISO): 74 U/L (ref 37–153)
BUN: 10 mg/dL (ref 7–25)
CO2: 29 mmol/L (ref 20–32)
Calcium: 9.2 mg/dL (ref 8.6–10.4)
Chloride: 106 mmol/L (ref 98–110)
Creat: 0.66 mg/dL (ref 0.60–1.00)
Globulin: 2.6 g/dL (ref 1.9–3.7)
Glucose, Bld: 85 mg/dL (ref 65–139)
Potassium: 4.1 mmol/L (ref 3.5–5.3)
Sodium: 143 mmol/L (ref 135–146)
Total Bilirubin: 0.3 mg/dL (ref 0.2–1.2)
Total Protein: 6.5 g/dL (ref 6.1–8.1)
eGFR: 91 mL/min/1.73m2

## 2024-07-30 LAB — C-REACTIVE PROTEIN: CRP: 5.7 mg/L

## 2024-08-01 ENCOUNTER — Ambulatory Visit (INDEPENDENT_AMBULATORY_CARE_PROVIDER_SITE_OTHER): Payer: Self-pay

## 2024-08-08 ENCOUNTER — Other Ambulatory Visit (INDEPENDENT_AMBULATORY_CARE_PROVIDER_SITE_OTHER): Payer: Self-pay

## 2024-08-08 ENCOUNTER — Telehealth (INDEPENDENT_AMBULATORY_CARE_PROVIDER_SITE_OTHER): Payer: Self-pay

## 2024-08-08 DIAGNOSIS — K219 Gastro-esophageal reflux disease without esophagitis: Secondary | ICD-10-CM

## 2024-08-08 NOTE — Telephone Encounter (Signed)
 Rabeprazole  20 mg refill request from [facility].

## 2024-08-12 MED ORDER — RABEPRAZOLE SODIUM 20 MG PO TBEC: 20.0000 mg | DELAYED_RELEASE_TABLET | Freq: Every day | ORAL | 2 refills | Status: AC

## 2024-08-19 NOTE — Telephone Encounter (Signed)
 submitted

## 2024-09-02 ENCOUNTER — Encounter: Attending: Gastroenterology | Admitting: *Deleted

## 2024-09-02 VITALS — BP 124/81 | HR 60 | Temp 97.8°F | Resp 16

## 2024-09-02 DIAGNOSIS — K50019 Crohn's disease of small intestine with unspecified complications: Secondary | ICD-10-CM

## 2024-09-02 MED ORDER — VEDOLIZUMAB 300 MG IV SOLR
300.0000 mg | Freq: Once | INTRAVENOUS | Status: AC
  Administered 2024-09-02: 300 mg via INTRAVENOUS
  Filled 2024-09-02: qty 5

## 2024-09-02 MED ORDER — SODIUM CHLORIDE 0.9 % IV SOLN: INTRAVENOUS | Status: DC

## 2024-09-02 NOTE — Progress Notes (Signed)
 Diagnosis: Crohn's Disease  Provider:  Shaaron Charleston MD  Procedure: IV Infusion  IV Type: Peripheral, IV Location: L Antecubital  Entyvio  (Vedolizumab ), Dose: 300 mg  Infusion Start Time: 1053  Infusion Stop Time: 1125  Post Infusion IV Care: Peripheral IV Discontinued  Discharge: Condition: Good, Destination: Home . AVS Declined  Performed by:  Baldwin Darice Helling, RN

## 2024-10-14 ENCOUNTER — Ambulatory Visit
